# Patient Record
Sex: Male | Born: 1960 | Race: White | Hispanic: No | Marital: Married | Smoking: Never smoker
Health system: Southern US, Community
[De-identification: ages and names within clinical notes are randomized; demographics above are authoritative.]

## PROBLEM LIST (undated history)

## (undated) DIAGNOSIS — B019 Varicella without complication: Secondary | ICD-10-CM

## (undated) DIAGNOSIS — I712 Thoracic aortic aneurysm, without rupture, unspecified: Secondary | ICD-10-CM

## (undated) DIAGNOSIS — I10 Essential (primary) hypertension: Secondary | ICD-10-CM

## (undated) DIAGNOSIS — E785 Hyperlipidemia, unspecified: Secondary | ICD-10-CM

## (undated) DIAGNOSIS — L988 Other specified disorders of the skin and subcutaneous tissue: Secondary | ICD-10-CM

## (undated) DIAGNOSIS — I2510 Atherosclerotic heart disease of native coronary artery without angina pectoris: Secondary | ICD-10-CM

## (undated) DIAGNOSIS — M1990 Unspecified osteoarthritis, unspecified site: Secondary | ICD-10-CM

## (undated) DIAGNOSIS — K219 Gastro-esophageal reflux disease without esophagitis: Secondary | ICD-10-CM

## (undated) DIAGNOSIS — T7840XA Allergy, unspecified, initial encounter: Secondary | ICD-10-CM

## (undated) HISTORY — DX: Gastro-esophageal reflux disease without esophagitis: K21.9

## (undated) HISTORY — DX: Varicella without complication: B01.9

## (undated) HISTORY — DX: Atherosclerotic heart disease of native coronary artery without angina pectoris: I25.10

## (undated) HISTORY — DX: Thoracic aortic aneurysm, without rupture, unspecified: I71.20

## (undated) HISTORY — PX: CYSTOSCOPY: SUR368

## (undated) HISTORY — PX: COLONOSCOPY: SHX174

## (undated) HISTORY — DX: Essential (primary) hypertension: I10

## (undated) HISTORY — DX: Other specified disorders of the skin and subcutaneous tissue: L98.8

## (undated) HISTORY — PX: WISDOM TOOTH EXTRACTION: SHX21

## (undated) HISTORY — DX: Unspecified osteoarthritis, unspecified site: M19.90

## (undated) HISTORY — DX: Thoracic aortic aneurysm, without rupture: I71.2

## (undated) HISTORY — PX: DENTAL SURGERY: SHX609

## (undated) HISTORY — DX: Allergy, unspecified, initial encounter: T78.40XA

## (undated) HISTORY — PX: MASTECTOMY: SHX3

## (undated) HISTORY — PX: ESOPHAGOGASTRODUODENOSCOPY: SHX1529

## (undated) HISTORY — DX: Hyperlipidemia, unspecified: E78.5

---

## 1996-02-02 ENCOUNTER — Encounter: Payer: Self-pay | Admitting: Pulmonary Disease

## 2005-05-31 ENCOUNTER — Ambulatory Visit: Payer: Self-pay | Admitting: Pulmonary Disease

## 2005-11-29 HISTORY — PX: LEG SURGERY: SHX1003

## 2006-08-04 ENCOUNTER — Ambulatory Visit: Payer: Self-pay | Admitting: Pulmonary Disease

## 2007-10-03 ENCOUNTER — Ambulatory Visit: Payer: Self-pay | Admitting: Pulmonary Disease

## 2008-10-11 DIAGNOSIS — J4540 Moderate persistent asthma, uncomplicated: Secondary | ICD-10-CM | POA: Insufficient documentation

## 2008-10-11 DIAGNOSIS — J4530 Mild persistent asthma, uncomplicated: Secondary | ICD-10-CM | POA: Insufficient documentation

## 2008-10-14 ENCOUNTER — Ambulatory Visit: Payer: Self-pay | Admitting: Pulmonary Disease

## 2008-11-15 ENCOUNTER — Telehealth (INDEPENDENT_AMBULATORY_CARE_PROVIDER_SITE_OTHER): Payer: Self-pay | Admitting: *Deleted

## 2009-02-24 ENCOUNTER — Telehealth (INDEPENDENT_AMBULATORY_CARE_PROVIDER_SITE_OTHER): Payer: Self-pay | Admitting: *Deleted

## 2010-05-25 ENCOUNTER — Telehealth (INDEPENDENT_AMBULATORY_CARE_PROVIDER_SITE_OTHER): Payer: Self-pay | Admitting: *Deleted

## 2010-05-28 ENCOUNTER — Ambulatory Visit: Payer: Self-pay | Admitting: Pulmonary Disease

## 2010-12-30 NOTE — Medication Information (Signed)
Summary: [employer]  [employer]   Imported By: [place] 06/08/2010 09:50:29  _____________________________________________________________________  External Attachment:    Type:   Image     Comment:   External Document

## 2010-12-30 NOTE — Progress Notes (Signed)
Summary: symbicort sample  Phone Note Call from Patient Call back at [phone]   Caller: Patient Call For: clance Reason for Call: Talk to Nurse Summary of Call: pt will be out of Symbicort today...can he get a sample to last until his visit Thursday? Initial call taken by: Eugene Gavia,  May 25, 2010 9:58 AM  Follow-up for Phone Call        1 sample symbicort 160-4.79mcg left up front for pt to pick up at his convenience.  pt verbalized his understanding. Boone Master CNA/MA  May 25, 2010 10:35 AM

## 2010-12-30 NOTE — Assessment & Plan Note (Signed)
Summary: rov for asthma   CC:  Pt is here for an overdue f/u appt.  Pt was last seen Nov 2009.    Pt states breathing is "good."  Pt states he does use rescue hfa every morning whether he needs it or not.  Pt states he will occ cough up cream to yellow colored sputum. Marland Kitchen  History of Present Illness: the pt comes in today for f/u of his known asthma.  He has been doing very well on symbicort, and reports no acute exacerbations or increased rescue inhaler use.  He does use his albuterol each am when he first gets up emperically, but doesn't really need it.  I have asked him to stop.  He exercises regularly, and does not feel his airways disease interferes with this.  Current Medications (verified): 1)  Ventolin Hfa 108 (90 Base) Mcg/act  Aers (Albuterol Sulfate) .... 2 Puffs Every 4 Hours As Needed 2)  Symbicort 160-4.5 Mcg/act Aero (Budesonide-Formoterol Fumarate) .... 2 Puffs Two Times A Day; Rinse Mouth Well After Using 3)  Zyrtec Allergy 10 Mg  Tabs (Cetirizine Hcl) .... Take 1 Tablet By Mouth Once A Day  Allergies (verified): 1)  ! Penicillin  Review of Systems       The patient complains of productive cough.  The patient denies shortness of breath with activity, shortness of breath at rest, non-productive cough, coughing up blood, chest pain, irregular heartbeats, acid heartburn, indigestion, loss of appetite, weight change, abdominal pain, difficulty swallowing, sore throat, tooth/dental problems, headaches, nasal congestion/difficulty breathing through nose, sneezing, itching, ear ache, anxiety, depression, hand/feet swelling, joint stiffness or pain, rash, change in color of mucus, and fever.    Vital Signs:  Patient profile:   50 year old male Height:      72 inches Weight:      205 pounds BMI:     27.90 O2 Sat:      97 % on Room air Temp:     98.1 degrees F oral Pulse rate:   64 / minute BP sitting:   130 / 84  (right arm) Cuff size:   regular  Vitals Entered By: Arman Filter LPN (May 28, 2010 3:09 PM)  O2 Flow:  Room air CC: Pt is here for an overdue f/u appt.  Pt was last seen Nov 2009.    Pt states breathing is "good."  Pt states he does use rescue hfa every morning whether he needs it or not.  Pt states he will occ cough up cream to yellow colored sputum.  Comments Medications reviewed with patient Arman Filter LPN  May 28, 2010 3:09 PM    Physical Exam  General:  wd male in nad Lungs:  totally clear to auscultation no wheezing or rhonchi Heart:  rrr Extremities:  no edema or cyanosis Neurologic:  alert and oriented, moves all 4.   Impression & Recommendations:  Problem # 1:  ASTHMA (ICD-493.90) the pt is doing very well on symbicort, with no exacerbations or increased rescue inhaler use.  I have asked him to stop using albuterol emperically in the am, and let me know if he sees a difference.  He is very pleased with his current level of control, and exercises regularly.  I have asked him to f/u with me in one year, and will check spirometry next visit.  Medications Added to Medication List This Visit: 1)  Zyrtec Allergy 10 Mg Tabs (Cetirizine hcl) .... Take 1 tablet by mouth once a day  as needed  Other Orders: Est. Patient Level III (11914)  Patient Instructions: 1)  followup with me in 12mos.  will check breathing studies next visit. 2)  no change in meds except stop ventolin in am's.

## 2011-06-10 ENCOUNTER — Other Ambulatory Visit: Payer: Self-pay | Admitting: Pulmonary Disease

## 2011-08-13 ENCOUNTER — Other Ambulatory Visit: Payer: Self-pay | Admitting: Pulmonary Disease

## 2011-09-24 ENCOUNTER — Other Ambulatory Visit: Payer: Self-pay | Admitting: Pulmonary Disease

## 2011-09-24 NOTE — Telephone Encounter (Signed)
LMOMTCB x 1 for the patient so we can schedule follow-up.

## 2011-09-27 ENCOUNTER — Telehealth: Payer: Self-pay | Admitting: Pulmonary Disease

## 2011-09-27 NOTE — Telephone Encounter (Signed)
LMTCBx1 to advise the pt that we can only send in 1 inhaler to last until OV since it has been 3 years sicne pt has been seen, does he still wants this sent to [facility]? Carron Curie, CMA

## 2011-09-28 NOTE — Telephone Encounter (Signed)
I was incorrect, pt last seen 04-2010. I spoke with the pt and he states he has enough to last until  His appt with Dr. Shelle Iron so no refill needed. Carron Curie, CMA

## 2011-10-28 ENCOUNTER — Telehealth: Payer: Self-pay | Admitting: Pulmonary Disease

## 2011-10-28 MED ORDER — BUDESONIDE-FORMOTEROL FUMARATE 160-4.5 MCG/ACT IN AERO: 2.0000 | INHALATION_SPRAY | Freq: Two times a day (BID) | RESPIRATORY_TRACT | Status: DC

## 2011-10-28 NOTE — Telephone Encounter (Signed)
I spoke with pt and advised him we did not have any samples. Pt states then he would need an rx sent. Pt was scheduled to see [facility] 10/29/11 but since he was rescheduled I advised pt will send rx x1 but will have to show up for OV on 11/19/11 for any further refills. Pt verbalized understanding

## 2011-10-29 ENCOUNTER — Ambulatory Visit: Payer: Self-pay | Admitting: Pulmonary Disease

## 2011-11-18 ENCOUNTER — Encounter: Payer: Self-pay | Admitting: *Deleted

## 2011-11-18 ENCOUNTER — Encounter: Payer: Self-pay | Admitting: Pulmonary Disease

## 2011-11-19 ENCOUNTER — Encounter: Payer: Self-pay | Admitting: Pulmonary Disease

## 2011-11-19 ENCOUNTER — Ambulatory Visit (INDEPENDENT_AMBULATORY_CARE_PROVIDER_SITE_OTHER): Payer: Managed Care, Other (non HMO) | Admitting: Pulmonary Disease

## 2011-11-19 VITALS — BP 138/100 | HR 66 | Temp 97.6°F | Ht 72.0 in | Wt 213.8 lb

## 2011-11-19 DIAGNOSIS — J45909 Unspecified asthma, uncomplicated: Secondary | ICD-10-CM

## 2011-11-19 MED ORDER — BUDESONIDE-FORMOTEROL FUMARATE 160-4.5 MCG/ACT IN AERO: 2.0000 | INHALATION_SPRAY | Freq: Two times a day (BID) | RESPIRATORY_TRACT | Status: DC

## 2011-11-19 NOTE — Assessment & Plan Note (Signed)
The patient is doing very well on his current regimen.  I have asked him to continue his symbicort, and to stay as active as possible.  His spirometry today is totally normal.  He will followup with me in one year, or sooner if having issues.

## 2011-11-19 NOTE — Progress Notes (Deleted)
  Subjective:    Patient ID: Phillip [name], male    DOB: [birth year], 50 y.o.   MRN: [id number]  HPI    Review of Systems  Constitutional: Negative for fever and unexpected weight change.  HENT: Negative for ear pain, nosebleeds, congestion, sore throat, rhinorrhea, sneezing, trouble swallowing, dental problem, postnasal drip and sinus pressure.   Eyes: Negative for redness and itching.  Respiratory: Negative for cough, chest tightness, shortness of breath and wheezing.   Cardiovascular: Negative for palpitations and leg swelling.  Gastrointestinal: Negative for nausea and vomiting.  Genitourinary: Negative for dysuria.  Musculoskeletal: Negative for joint swelling.  Skin: Negative for rash.  Neurological: Negative for headaches.  Hematological: Does not bruise/bleed easily.  Psychiatric/Behavioral: Negative for dysphoric mood. The patient is not nervous/anxious.        Objective:   Physical Exam        Assessment & Plan:

## 2011-11-19 NOTE — Patient Instructions (Signed)
No change in medications followup with me in one year.  

## 2011-11-19 NOTE — Progress Notes (Signed)
  Subjective:    Patient ID: Phillip [name], male    DOB: 1961/03/22, 50 y.o.   MRN: [id number]  HPI Patient comes in today for followup of his known asthma.  He's been doing very well on symbicort, and almost never uses his rescue inhaler.  He has no nocturnal symptoms, and has not had an acute exacerbation since his last visit.  He is staying very active, and denies any issues with dyspnea   Review of Systems  Constitutional: Negative for fever and unexpected weight change.  HENT: Negative for ear pain, nosebleeds, congestion, sore throat, rhinorrhea, sneezing, trouble swallowing, dental problem, postnasal drip and sinus pressure.   Eyes: Negative for redness and itching.  Respiratory: Negative for cough, chest tightness, shortness of breath and wheezing.   Cardiovascular: Negative for palpitations and leg swelling.  Gastrointestinal: Negative for nausea and vomiting.  Genitourinary: Negative for dysuria.  Musculoskeletal: Negative for joint swelling.  Skin: Negative for rash.  Neurological: Negative for headaches.  Hematological: Does not bruise/bleed easily.  Psychiatric/Behavioral: Negative for dysphoric mood. The patient is not nervous/anxious.        Objective:   Physical Exam Well-developed male in no acute distress Nose without purulence or discharge noted Chest totally clear to auscultation, no wheezing Cardiac exam with regular rate and rhythm Lower extremities without edema, no cyanosis noted Alert and oriented, moves all 4 extremities.       Assessment & Plan:

## 2011-11-19 NOTE — Progress Notes (Signed)
Addended by: Tommie Sams on: 11/19/2011 10:12 AM   Modules accepted: Orders

## 2012-06-16 ENCOUNTER — Telehealth: Payer: Self-pay | Admitting: Pulmonary Disease

## 2012-06-16 MED ORDER — BUDESONIDE-FORMOTEROL FUMARATE 160-4.5 MCG/ACT IN AERO: 2.0000 | INHALATION_SPRAY | Freq: Two times a day (BID) | RESPIRATORY_TRACT | Status: DC

## 2012-06-16 NOTE — Telephone Encounter (Signed)
Pt aware Rx has been sent and pharmacy placed on file in his chart.

## 2012-11-20 ENCOUNTER — Ambulatory Visit (INDEPENDENT_AMBULATORY_CARE_PROVIDER_SITE_OTHER): Payer: 59 | Admitting: Pulmonary Disease

## 2012-11-20 ENCOUNTER — Encounter: Payer: Self-pay | Admitting: Pulmonary Disease

## 2012-11-20 VITALS — BP 128/88 | HR 60 | Temp 97.9°F | Ht 72.0 in | Wt 194.0 lb

## 2012-11-20 DIAGNOSIS — J45909 Unspecified asthma, uncomplicated: Secondary | ICD-10-CM

## 2012-11-20 NOTE — Patient Instructions (Addendum)
No change in medications followup with me in one year.

## 2012-11-20 NOTE — Assessment & Plan Note (Signed)
The patient is doing very well from an asthma standpoint.  He is staying on his maintenance medications, and rarely uses his rescue inhaler.  I have asked him to continue on his bronchodilator regimen, and to followup with me in one year.

## 2012-11-20 NOTE — Progress Notes (Signed)
  Subjective:    Patient ID: Phillip [name], male    DOB: September 05, 1961, 51 y.o.   MRN: [id number]  HPI She comes in today for followup of his known asthma.  He is taking his maintenance medication compliantly, and rarely has to use his rescue inhaler.  He has had no acute exacerbation her pulmonary infection since last visit.   Review of Systems  Constitutional: Negative for fever and unexpected weight change.  HENT: Negative for ear pain, nosebleeds, congestion, sore throat, rhinorrhea, sneezing, trouble swallowing, dental problem, postnasal drip and sinus pressure.   Eyes: Negative for redness and itching.  Respiratory: Negative for cough, chest tightness, shortness of breath and wheezing.   Cardiovascular: Negative for palpitations and leg swelling.  Gastrointestinal: Negative for nausea and vomiting.  Genitourinary: Negative for dysuria.  Musculoskeletal: Negative for joint swelling.  Skin: Negative for rash.  Neurological: Negative for headaches.  Hematological: Does not bruise/bleed easily.  Psychiatric/Behavioral: Negative for dysphoric mood. The patient is not nervous/anxious.        Objective:   Physical Exam Well-developed male in no acute distress Nose without purulence or discharge noted Chest totally clear to auscultation, no wheezing Cardiac exam with regular rate and rhythm Lower extremities without edema, cyanosis Alert and oriented, moves all 4 extremities.       Assessment & Plan:

## 2013-02-01 ENCOUNTER — Telehealth: Payer: Self-pay | Admitting: Pulmonary Disease

## 2013-02-01 ENCOUNTER — Other Ambulatory Visit: Payer: Self-pay | Admitting: Pulmonary Disease

## 2013-02-01 NOTE — Telephone Encounter (Signed)
Refill was sent today. I called pharmacy to verify they received it and they did. Pt is aware. Carron Curie, CMA

## 2013-03-06 ENCOUNTER — Other Ambulatory Visit: Payer: Self-pay | Admitting: Pulmonary Disease

## 2013-11-19 ENCOUNTER — Encounter: Payer: Self-pay | Admitting: Pulmonary Disease

## 2013-11-19 ENCOUNTER — Ambulatory Visit (INDEPENDENT_AMBULATORY_CARE_PROVIDER_SITE_OTHER): Payer: 59 | Admitting: Pulmonary Disease

## 2013-11-19 VITALS — BP 132/94 | HR 81 | Temp 98.1°F | Ht 72.0 in | Wt 206.8 lb

## 2013-11-19 DIAGNOSIS — J45909 Unspecified asthma, uncomplicated: Secondary | ICD-10-CM

## 2013-11-19 NOTE — Progress Notes (Signed)
   Subjective:    Patient ID: Phillip [name], male    DOB: 07-23-1961, 52 y.o.   MRN: [id number]  HPI The patient comes in today for followup of his known asthma. He is doing very well, and has not had an acute exacerbation since last visit. He never uses his rescue inhaler, and has excellent exertional tolerance.   Review of Systems  Constitutional: Negative for fever and unexpected weight change.  HENT: Positive for congestion. Negative for dental problem, ear pain, nosebleeds, postnasal drip, rhinorrhea, sinus pressure, sneezing, sore throat and trouble swallowing.   Eyes: Negative for redness and itching.  Respiratory: Positive for cough. Negative for chest tightness, shortness of breath and wheezing.   Cardiovascular: Negative for palpitations and leg swelling.  Gastrointestinal: Negative for nausea and vomiting.  Genitourinary: Negative for dysuria.  Musculoskeletal: Negative for joint swelling.  Skin: Negative for rash.  Neurological: Negative for headaches.  Hematological: Does not bruise/bleed easily.  Psychiatric/Behavioral: Negative for dysphoric mood. The patient is not nervous/anxious.        Objective:   Physical Exam Well-developed male in no acute distress Nose without purulence or discharge noted Neck without lymphadenopathy or thyromegaly Chest totally clear to auscultation, no wheezing Cardiac exam with regular rate and rhythm Lower extremities without edema, no cyanosis Alert and oriented, moves all 4 extremities.       Assessment & Plan:

## 2013-11-19 NOTE — Patient Instructions (Signed)
No change in medications followup with me in one year if doing well.

## 2013-11-19 NOTE — Assessment & Plan Note (Signed)
The patient is doing very well from an asthma standpoint on his current regimen, and has not required his rescue inhaler. I have asked him to stay on his maintenance medications, and to followup with me yearly. He has already had his flu shot this year.

## 2013-11-30 ENCOUNTER — Other Ambulatory Visit: Payer: Self-pay | Admitting: Pulmonary Disease

## 2014-06-21 ENCOUNTER — Telehealth: Payer: Self-pay | Admitting: Pulmonary Disease

## 2014-06-21 ENCOUNTER — Other Ambulatory Visit: Payer: Self-pay | Admitting: Pulmonary Disease

## 2014-06-21 MED ORDER — SYMBICORT 160-4.5 MCG/ACT IN AERO: INHALATION_SPRAY | RESPIRATORY_TRACT | Status: DC

## 2014-06-21 NOTE — Telephone Encounter (Signed)
Pt aware RX has been sent. Nothing further needed 

## 2014-09-27 ENCOUNTER — Telehealth: Payer: Self-pay | Admitting: Pulmonary Disease

## 2014-09-27 NOTE — Telephone Encounter (Signed)
lmomtcb x1 What is on his formulary?

## 2014-09-27 NOTE — Telephone Encounter (Signed)
Pt called back. Reports alvesco, qvar and asmanex is on formulary. Please advise [place] thanks

## 2014-09-30 ENCOUNTER — Telehealth: Payer: Self-pay | Admitting: Pulmonary Disease

## 2014-09-30 NOTE — Telephone Encounter (Signed)
Let him know that none of those are the EQUIVALENT of symbicort. His insurance has to have something on their formulary that is the same.  Have him check for advair, breo, dulera.

## 2014-09-30 NOTE — Telephone Encounter (Signed)
Message closed in error copied below:  Phillip [name] at 09/30/2014 10:24 AM     Status: Signed       Expand All Collapse All   Pt returned call. [place] at 09/30/2014 9:18 AM     Status: Signed       Expand All Collapse All   lmtcb X1            Kathee Delton, MD at 09/30/2014 9:07 AM     Status: Signed       Expand All Collapse All   Let him know that none of those are the EQUIVALENT of symbicort. His insurance has to have something on their formulary that is the same. Have him check for advair, breo, dulera.             Inge Rise, CMA at 09/27/2014 2:21 PM     Status: Signed       Expand All Collapse All   Pt called back. Reports alvesco, qvar and asmanex is on formulary. Please advise [place] thanks            Inge Rise, [place] at 09/27/2014 2:15 PM     Status: Signed       Expand All Collapse All   lmomtcb x1 What is on his formulary?             Encounter MyChart Messages     No messages in this encounter     Routing History     Priority Sent On From To Message Type     09/30/2014 9:08 AM Kathee Delton, MD [facility] Triage Pool Patient Calls     09/27/2014 2:21 PM Mindy Elza Rafter, CMA Kathee Delton, MD Patient Calls     09/27/2014 1:45 PM Ilona Sorrel [facility] Triage Pool Patient Calls      Created by     Ilona Sorrel on 09/27/2014 01:43 PM     [place], [place] - [place]     Contacts       Type Contact Phone    09/27/2014 01:43 PM Phone (Incoming) Sadiq, [place] (Self) [phone] (M)    Changed insurances. Symbicort is not on the formulary. They told him it could be appealed or [employer] could give something else that is on formulary.

## 2014-09-30 NOTE — Telephone Encounter (Signed)
Called and spoke with pt and he stated that he has tried the advair in the past and this was just not effective for him, so he was changed to symbicort.  He will call the insurance company and see if the [place] or breo is covered.  If not pt would like to see if we can do the PA for the symbicort.  Will send to [place] to wait for pt to call back.

## 2014-09-30 NOTE — Telephone Encounter (Signed)
Pt returned call.  Phillip [name] ° °

## 2014-09-30 NOTE — Telephone Encounter (Signed)
lmtcb X1 

## 2014-10-02 NOTE — Telephone Encounter (Signed)
Pt has not called back LMTCB x1

## 2014-10-04 NOTE — Telephone Encounter (Signed)
Called and spoke with patient, states that he has not had a chance to call the insurance company yet. Will call us once he speaks with them-plans to call next week. Pt states that his pharmacy gave him a free Rx- so he is good for another 30 days. Will send to [place] to await patient call back.

## 2014-10-04 NOTE — Telephone Encounter (Signed)
Once pt speaks with his insurance and calls back, message can be sent to [facility] to advise on thanks

## 2014-10-09 NOTE — Telephone Encounter (Signed)
Called and spoke to pt. Pt stated he has sent a letter to his insurance company regarding his formulary and is waiting to hear back. Pt stated if he has not received call from insurance company in 1-2 days then he will call then and then call us back with update.   Will forward to [place] to make aware.

## 2014-10-09 NOTE — Telephone Encounter (Signed)
Once pt calls back. Message should be routed to [facility]. Thanks

## 2014-10-15 NOTE — Telephone Encounter (Signed)
Called spoke with patient to obtain update Pt stated that he has sent a letter to his insurance company asking to add Symbicort to his formulary and reported has not yet heard anything back from them - he will call them to check on this when he can.  Pt is also aware that he is due for appt with [facility] but was unable to schedule this now.

## 2014-10-16 NOTE — Telephone Encounter (Signed)
LMTCB

## 2014-10-16 NOTE — Telephone Encounter (Signed)
Spoke with pt-- Symbicort is going to be a tier three medication ($70) -- recommended meds per last telephone note advair, breo, dulera are also tier three.  Please advise Dr Gwenette Greet if any other alternatives.

## 2014-10-16 NOTE — Telephone Encounter (Signed)
Those are the 4 meds that are equivalent.  No other choices Is it still that price if he does mail order for 70mos or more.

## 2014-10-16 NOTE — Telephone Encounter (Signed)
Pt returning call.Phillip [name] ° °

## 2014-10-17 NOTE — Telephone Encounter (Signed)
lmomtcb x 2  

## 2014-10-21 NOTE — Telephone Encounter (Signed)
lmomtcb x 3  

## 2014-10-22 MED ORDER — FLUTICASONE-SALMETEROL 250-50 MCG/DOSE IN AEPB: 1.0000 | INHALATION_SPRAY | Freq: Two times a day (BID) | RESPIRATORY_TRACT | Status: DC

## 2014-10-22 NOTE — Telephone Encounter (Signed)
advair 250/50  One inhalation bid Rinse mouth well.

## 2014-10-22 NOTE — Telephone Encounter (Signed)
Rx sent. Pt aware.  

## 2014-10-22 NOTE — Telephone Encounter (Signed)
Pt states that he would liked to go back on the Advair for now to see if it will help and if it doesn't he said he will make appt to see what he needs to do about other alternatives.  Please advise on dose and strength of Advair.

## 2015-02-24 ENCOUNTER — Telehealth: Payer: Self-pay | Admitting: Pulmonary Disease

## 2015-02-24 ENCOUNTER — Other Ambulatory Visit: Payer: Self-pay | Admitting: Pulmonary Disease

## 2015-02-24 NOTE — Telephone Encounter (Signed)
Samples have been left up front for pick up. Pt is aware. Nothing further was needed. 

## 2015-03-17 ENCOUNTER — Ambulatory Visit (INDEPENDENT_AMBULATORY_CARE_PROVIDER_SITE_OTHER): Payer: 59 | Admitting: Pulmonary Disease

## 2015-03-17 ENCOUNTER — Encounter: Payer: Self-pay | Admitting: Pulmonary Disease

## 2015-03-17 VITALS — BP 150/84 | HR 81 | Temp 97.2°F | Ht 72.0 in | Wt 213.4 lb

## 2015-03-17 DIAGNOSIS — J301 Allergic rhinitis due to pollen: Secondary | ICD-10-CM

## 2015-03-17 DIAGNOSIS — J309 Allergic rhinitis, unspecified: Secondary | ICD-10-CM | POA: Insufficient documentation

## 2015-03-17 MED ORDER — FLUTICASONE-SALMETEROL 115-21 MCG/ACT IN AERO: 2.0000 | INHALATION_SPRAY | Freq: Two times a day (BID) | RESPIRATORY_TRACT | Status: DC

## 2015-03-17 MED ORDER — PREDNISONE 10 MG PO TABS: ORAL_TABLET | ORAL | Status: DC

## 2015-03-17 MED ORDER — MONTELUKAST SODIUM 10 MG PO TABS: 10.0000 mg | ORAL_TABLET | Freq: Every day | ORAL | Status: DC

## 2015-03-17 MED ORDER — ALBUTEROL SULFATE HFA 108 (90 BASE) MCG/ACT IN AERS: 2.0000 | INHALATION_SPRAY | Freq: Four times a day (QID) | RESPIRATORY_TRACT | Status: DC | PRN

## 2015-03-17 NOTE — Assessment & Plan Note (Signed)
The patient has done fairly well from an asthma standpoint, but has been having some issues with the spring allergy season. He is currently having some increased symptoms, and does have some mild wheezing on exam today. He has done well with Symbicort in the past, but his insurance no longer covers this. He is been changed to [place], but wishes to try HFA.

## 2015-03-17 NOTE — Patient Instructions (Signed)
Change your advair diskus to advair HFA 115, 2 inhalation each am and pm.  Rinse mouth well Prednisone taper over 8 days to get you thru your allergy issues Start on singulair 10mg  each am.  Take from March thru June each year if having allergy issues.  Can also take Sept thru Nov if having fall allergy issues.  followup with Dr. [place] Bells in one year if doing well.

## 2015-03-17 NOTE — Assessment & Plan Note (Signed)
The patient is having a severe acute exacerbation of his allergic rhinitis, and I will start him on a short course of prednisone to get him through this episode. Would also like to try him on Singulair March through June to see if this will prevent his flareups.

## 2015-03-17 NOTE — Progress Notes (Signed)
   Subjective:    Patient ID: Phillip [name], male    DOB: 02-21-1961, 54 y.o.   MRN: [id number]  HPI Patient comes in today for follow-up of his known asthma. He has done fairly well from a breathing standpoint, but has been having severe issues the spring with allergies. He has both I and nasal symptoms, and feels that it does bother his asthma. His insurance recently quit covering Symbicort and he has been on Advair discus. He likes HFA better, and will change him over to the Advair [facility] formulation. He has not been overusing his rescue inhaler.   Review of Systems  Constitutional: Negative for fever and unexpected weight change.  HENT: Positive for congestion, postnasal drip and rhinorrhea. Negative for dental problem, ear pain, nosebleeds, sinus pressure, sneezing, sore throat and trouble swallowing.   Eyes: Negative for redness and itching.  Respiratory: Positive for cough and shortness of breath. Negative for chest tightness and wheezing.   Cardiovascular: Negative for palpitations and leg swelling.  Gastrointestinal: Negative for nausea and vomiting.  Genitourinary: Negative for dysuria.  Musculoskeletal: Negative for joint swelling.  Skin: Negative for rash.  Neurological: Negative for headaches.  Hematological: Does not bruise/bleed easily.  Psychiatric/Behavioral: Negative for dysphoric mood. The patient is not nervous/anxious.        Objective:   Physical Exam Well-developed male in no acute distress Nose without purulence or discharge noted Neck without lymphadenopathy or thyromegaly Chest with scattered wheezes, but adequate airflow Cardiac exam with regular rate and rhythm Lower extremities without edema, no cyanosis Alert and oriented, moves all 4 extremities.       Assessment & Plan:

## 2015-10-03 ENCOUNTER — Telehealth: Payer: Self-pay | Admitting: Behavioral Health

## 2015-10-03 ENCOUNTER — Encounter: Payer: Self-pay | Admitting: Behavioral Health

## 2015-10-03 NOTE — Telephone Encounter (Signed)
Pre-Visit Call completed with patient and chart updated.   Pre-Visit Info documented in Specialty Comments under [employer].    

## 2015-10-06 ENCOUNTER — Telehealth: Payer: Self-pay | Admitting: Physician Assistant

## 2015-10-06 ENCOUNTER — Ambulatory Visit: Payer: 59 | Admitting: Physician Assistant

## 2015-10-09 ENCOUNTER — Telehealth: Payer: Self-pay | Admitting: Physician Assistant

## 2015-10-09 NOTE — Telephone Encounter (Signed)
Talked to pt. He will call back when he is home and has calendar to reschedule new pt appt. Was not seen 10/06/15 due to Einar Pheasant being out sick.

## 2015-10-09 NOTE — Telephone Encounter (Signed)
error 

## 2015-11-17 ENCOUNTER — Telehealth: Payer: Self-pay | Admitting: Pulmonary Disease

## 2015-11-17 MED ORDER — FLUTICASONE-SALMETEROL 115-21 MCG/ACT IN AERO: 2.0000 | INHALATION_SPRAY | Freq: Two times a day (BID) | RESPIRATORY_TRACT | Status: DC

## 2015-11-17 NOTE — Telephone Encounter (Signed)
Left message on pt's named voicemail. Rx has been sent in to last until he comes in to see BQ (recall placed for April). Nothing further was needed.

## 2015-12-12 ENCOUNTER — Ambulatory Visit (INDEPENDENT_AMBULATORY_CARE_PROVIDER_SITE_OTHER): Payer: 59 | Admitting: Physician Assistant

## 2015-12-12 ENCOUNTER — Encounter: Payer: Self-pay | Admitting: Physician Assistant

## 2015-12-12 VITALS — BP 118/88 | HR 75 | Temp 98.0°F | Ht 72.0 in | Wt 216.0 lb

## 2015-12-12 DIAGNOSIS — Z1211 Encounter for screening for malignant neoplasm of colon: Secondary | ICD-10-CM

## 2015-12-12 DIAGNOSIS — M7712 Lateral epicondylitis, left elbow: Secondary | ICD-10-CM | POA: Diagnosis not present

## 2015-12-12 DIAGNOSIS — M7710 Lateral epicondylitis, unspecified elbow: Secondary | ICD-10-CM | POA: Insufficient documentation

## 2015-12-12 DIAGNOSIS — Z0001 Encounter for general adult medical examination with abnormal findings: Secondary | ICD-10-CM

## 2015-12-12 DIAGNOSIS — Z125 Encounter for screening for malignant neoplasm of prostate: Secondary | ICD-10-CM | POA: Insufficient documentation

## 2015-12-12 DIAGNOSIS — Z0000 Encounter for general adult medical examination without abnormal findings: Secondary | ICD-10-CM | POA: Diagnosis not present

## 2015-12-12 DIAGNOSIS — J4540 Moderate persistent asthma, uncomplicated: Secondary | ICD-10-CM

## 2015-12-12 DIAGNOSIS — Z114 Encounter for screening for human immunodeficiency virus [HIV]: Secondary | ICD-10-CM | POA: Diagnosis not present

## 2015-12-12 LAB — COMPREHENSIVE METABOLIC PANEL
ALK PHOS: 48 U/L (ref 39–117)
ALT: 40 U/L (ref 0–53)
AST: 23 U/L (ref 0–37)
Albumin: 4.6 g/dL (ref 3.5–5.2)
BUN: 12 mg/dL (ref 6–23)
CALCIUM: 9.7 mg/dL (ref 8.4–10.5)
CO2: 31 mEq/L (ref 19–32)
Chloride: 102 mEq/L (ref 96–112)
Creatinine, Ser: 1.02 mg/dL (ref 0.40–1.50)
GFR: 81.02 mL/min (ref >60.00)
Glucose, Bld: 117 mg/dL — ABNORMAL HIGH (ref 70–99)
Potassium: 4.1 mEq/L (ref 3.5–5.1)
Sodium: 141 mEq/L (ref 135–145)
TOTAL PROTEIN: 7.2 g/dL (ref 6.0–8.3)
Total Bilirubin: 0.6 mg/dL (ref 0.2–1.2)

## 2015-12-12 LAB — URINALYSIS, ROUTINE W REFLEX MICROSCOPIC
Bilirubin Urine: NEGATIVE
Hgb urine dipstick: NEGATIVE
Ketones, ur: NEGATIVE
Leukocytes, UA: NEGATIVE
Nitrite: NEGATIVE
PH: 6.5 (ref 5.0–8.0)
RBC / HPF: NONE SEEN (ref 0–?)
Specific Gravity, Urine: 1.015 (ref 1.000–1.030)
TOTAL PROTEIN, URINE-UPE24: NEGATIVE
URINE GLUCOSE: NEGATIVE
Urobilinogen, UA: 0.2 (ref 0.0–1.0)

## 2015-12-12 LAB — LIPID PANEL
CHOL/HDL RATIO: 5
Cholesterol: 214 mg/dL — ABNORMAL HIGH (ref 0–200)
HDL: 40.5 mg/dL (ref >39.00)
LDL CALC: 138 mg/dL — AB (ref 0–99)
NONHDL: 173.04
Triglycerides: 173 mg/dL — ABNORMAL HIGH (ref 0.0–149.0)
VLDL: 34.6 mg/dL (ref 0.0–40.0)

## 2015-12-12 LAB — CBC
HEMATOCRIT: 49.8 % (ref 39.0–52.0)
HEMOGLOBIN: 16.9 g/dL (ref 13.0–17.0)
MCHC: 33.9 g/dL (ref 30.0–36.0)
MCV: 91.1 fl (ref 78.0–100.0)
PLATELETS: 286 10*3/uL (ref 150.0–400.0)
RBC: 5.47 Mil/uL (ref 4.22–5.81)
RDW: 12.8 % (ref 11.5–15.5)
WBC: 6.7 10*3/uL (ref 4.0–10.5)

## 2015-12-12 LAB — PSA: PSA: 1.23 ng/mL (ref 0.10–4.00)

## 2015-12-12 LAB — HEMOGLOBIN A1C: HEMOGLOBIN A1C: 5.5 % (ref 4.6–6.5)

## 2015-12-12 LAB — TSH: TSH: 1.2 u[IU]/mL (ref 0.35–4.50)

## 2015-12-12 MED ORDER — MELOXICAM 15 MG PO TABS: 15.0000 mg | ORAL_TABLET | Freq: Every day | ORAL | Status: DC

## 2015-12-12 NOTE — Assessment & Plan Note (Signed)
Depression screen negative. Health Maintenance reviewed -- flu and tetanus up-to-date. Agrees to one-time HIV screen. Declines hep C screen. Will place referral for screening colonoscopy. Preventive schedule discussed and handout given in AVS. Will obtain fasting labs today.

## 2015-12-12 NOTE — Progress Notes (Signed)
Patient presents to clinic today to establish care. Patient is requesting CPE today. Is fasting for labs.   Patient endorses well-balanced diet. Is not exercising currently but is wanting to get back into a regimen. Body mass index is 29.29 kg/(m^2).  Acute Concerns: Patient endorses pain of lateral left elbow with repetitive motions. Works with his hands. Endorses history  Chronic Issues: Asthma -- Is followed by Pulmonology (Dr. Lake Bells). Is on Advair daily and albuterol PRN. Denies exacerbations or nighttime symptoms.   Health Maintenance: Immunizations -- up-to-date  Colonoscopy -- Is overdue for screening. Will place referral to GI for screening colonoscopy.  Agrees to one-time screen for HIV. Is only sexually active with his wife. Denies history of STI or concerns.  Past Medical History  Diagnosis Date  . Asthma     Followed by Pulmonology  . Chicken pox     Past Surgical History  Procedure Laterality Date  . Leg surgery Left 2007  . Mastectomy    . Wisdom tooth extraction    . Dental surgery      Current Outpatient Prescriptions on File Prior to Visit  Medication Sig Dispense Refill  . albuterol (VENTOLIN HFA) 108 (90 BASE) MCG/ACT inhaler Inhale 2 puffs into the lungs every 6 (six) hours as needed. 1 Inhaler 4  . cetirizine (ZYRTEC) 10 MG tablet Take 10 mg by mouth 2 (two) times daily.     . diphenhydrAMINE (BENADRYL) 25 MG tablet Take 25 mg by mouth at bedtime as needed.    . fluticasone-salmeterol (ADVAIR HFA) 115-21 MCG/ACT inhaler Inhale 2 puffs into the lungs 2 (two) times daily. 1 Inhaler 3  . ibuprofen (ADVIL,MOTRIN) 200 MG tablet Take 200 mg by mouth every 6 (six) hours as needed.     No current facility-administered medications on file prior to visit.    Allergies  Allergen Reactions  . Penicillins Rash    Childhood reaction    Family History  Problem Relation Age of Onset  . Hodgkin's lymphoma Child     Remission  . Hypertension Mother    Controlled with diet  . Hyperlipidemia Mother   . Emphysema Maternal Uncle     Social History   Social History  . Marital Status: Married    Spouse Name: N/A  . Number of Children: N/A  . Years of Education: N/A   Occupational History  . Not on file.   Social History Main Topics  . Smoking status: Never Smoker   . Smokeless tobacco: Never Used  . Alcohol Use: 0.0 oz/week    0 Standard drinks or equivalent per week  . Drug Use: No  . Sexual Activity: Not on file   Other Topics Concern  . Not on file   Social History Narrative   Review of Systems  Constitutional: Negative for fever and weight loss.  HENT: Negative for ear discharge, ear pain, hearing loss and tinnitus.   Eyes: Negative for blurred vision, double vision, photophobia and pain.  Respiratory: Negative for cough and shortness of breath.   Cardiovascular: Negative for chest pain and palpitations.  Gastrointestinal: Negative for heartburn, nausea, vomiting, abdominal pain, diarrhea, constipation, blood in stool and melena.  Genitourinary: Negative for dysuria, urgency, frequency, hematuria and flank pain.  Musculoskeletal: Positive for joint pain. Negative for falls.  Neurological: Negative for dizziness, loss of consciousness and headaches.  Endo/Heme/Allergies: Negative for environmental allergies.  Psychiatric/Behavioral: Negative for depression, suicidal ideas, hallucinations and substance abuse. The patient is not nervous/anxious and does not  have insomnia.    BP 118/88 mmHg  Pulse 75  Temp(Src) 98 F (36.7 C) (Oral)  Ht 6' (1.829 m)  Wt 216 lb (97.977 kg)  BMI 29.29 kg/m2  SpO2 99%  Physical Exam  Constitutional: He is oriented to person, place, and time and well-developed, well-nourished, and in no distress.  HENT:  Head: Normocephalic and atraumatic.  Right Ear: External ear normal.  Left Ear: External ear normal.  Nose: Nose normal.  Mouth/Throat: Oropharynx is clear and moist. No  oropharyngeal exudate.  Eyes: Conjunctivae and EOM are normal. Pupils are equal, round, and reactive to light.  Neck: Neck supple. No thyromegaly present.  Cardiovascular: Normal rate, regular rhythm, normal heart sounds and intact distal pulses.   Pulmonary/Chest: Effort normal and breath sounds normal. No respiratory distress. He has no wheezes. He has no rales. He exhibits no tenderness.  Abdominal: Soft. Bowel sounds are normal. He exhibits no distension and no mass. There is no tenderness. There is no rebound and no guarding.  Genitourinary: Testes/scrotum normal and penis normal. No discharge found.  Musculoskeletal:       Left elbow: He exhibits normal range of motion, no swelling and no effusion. Tenderness found. Lateral epicondyle tenderness noted.  Lymphadenopathy:    He has no cervical adenopathy.  Neurological: He is alert and oriented to person, place, and time.  Skin: Skin is warm and dry. No rash noted.  Psychiatric: Affect normal.  Vitals reviewed.   Recent Results (from the past 2160 hour(s))  CBC     Status: None   Collection Time: 12/12/15  8:37 AM  Result Value Ref Range   WBC 6.7 4.0 - 10.5 K/uL   RBC 5.47 4.22 - 5.81 Mil/uL   Platelets 286.0 150.0 - 400.0 K/uL   Hemoglobin 16.9 13.0 - 17.0 g/dL   HCT 49.8 39.0 - 52.0 %   MCV 91.1 78.0 - 100.0 fl   MCHC 33.9 30.0 - 36.0 g/dL   RDW 12.8 11.5 - 15.5 %  Comp Met (CMET)     Status: Abnormal   Collection Time: 12/12/15  8:37 AM  Result Value Ref Range   Sodium 141 135 - 145 mEq/L   Potassium 4.1 3.5 - 5.1 mEq/L   Chloride 102 96 - 112 mEq/L   CO2 31 19 - 32 mEq/L   Glucose, Bld 117 (H) 70 - 99 mg/dL   BUN 12 6 - 23 mg/dL   Creatinine, Ser 1.02 0.40 - 1.50 mg/dL   Total Bilirubin 0.6 0.2 - 1.2 mg/dL   Alkaline Phosphatase 48 39 - 117 U/L   AST 23 0 - 37 U/L   ALT 40 0 - 53 U/L   Total Protein 7.2 6.0 - 8.3 g/dL   Albumin 4.6 3.5 - 5.2 g/dL   Calcium 9.7 8.4 - 10.5 mg/dL   GFR 81.02 >60.00 mL/min  TSH      Status: None   Collection Time: 12/12/15  8:37 AM  Result Value Ref Range   TSH 1.20 0.35 - 4.50 uIU/mL  Hemoglobin A1c     Status: None   Collection Time: 12/12/15  8:37 AM  Result Value Ref Range   Hgb A1c MFr Bld 5.5 4.6 - 6.5 %    Comment: Glycemic Control Guidelines for People with Diabetes:Non Diabetic:  <6%Goal of Therapy: <7%Additional Action Suggested:  >8%   Urinalysis, Routine w reflex microscopic     Status: Abnormal   Collection Time: 12/12/15  8:37 AM  Result Value Ref  Range   Color, Urine YELLOW Yellow;Lt. Yellow   APPearance CLEAR Clear   Specific Gravity, Urine 1.015 1.000-1.030   pH 6.5 5.0 - 8.0   Total Protein, Urine NEGATIVE Negative   Urine Glucose NEGATIVE Negative   Ketones, ur NEGATIVE Negative   Bilirubin Urine NEGATIVE Negative   Hgb urine dipstick NEGATIVE Negative   Urobilinogen, UA 0.2 0.0 - 1.0   Leukocytes, UA NEGATIVE Negative   Nitrite NEGATIVE Negative   WBC, UA 0-2/hpf 0-2/hpf   RBC / HPF none seen 0-2/hpf   Mucus, UA Presence of (A) None  Lipid Profile     Status: Abnormal   Collection Time: 12/12/15  8:37 AM  Result Value Ref Range   Cholesterol 214 (H) 0 - 200 mg/dL    Comment: ATP III Classification       Desirable:  < 200 mg/dL               Borderline High:  200 - 239 mg/dL          High:  > = 240 mg/dL   Triglycerides 173.0 (H) 0.0 - 149.0 mg/dL    Comment: Normal:  <150 mg/dLBorderline High:  150 - 199 mg/dL   HDL 40.50 >39.00 mg/dL   VLDL 34.6 0.0 - 40.0 mg/dL   LDL Cholesterol 138 (H) 0 - 99 mg/dL   Total CHOL/HDL Ratio 5     Comment:                Men          Women1/2 Average Risk     3.4          3.3Average Risk          5.0          4.42X Average Risk          9.6          7.13X Average Risk          15.0          11.0                       NonHDL 173.04     Comment: NOTE:  Non-HDL goal should be 30 mg/dL higher than patient's LDL goal (i.e. LDL goal of < 70 mg/dL, would have non-HDL goal of < 100 mg/dL)  PSA     Status: None    Collection Time: 12/12/15  8:37 AM  Result Value Ref Range   PSA 1.23 0.10 - 4.00 ng/mL    Assessment/Plan: Visit for preventive health examination Depression screen negative. Health Maintenance reviewed -- flu and tetanus up-to-date. Agrees to one-time HIV screen. Declines hep C screen. Will place referral for screening colonoscopy. Preventive schedule discussed and handout given in AVS. Will obtain fasting labs today.   Tennis elbow syndrome Rx Anaprox DS. Supportive measures reviewed. Follow-up PRN.  Screening for HIV without presence of risk factors Order for HIV antibody placed.  Prostate cancer screening Discussed risks and benefits of prostate cancer screening. Asymptomatic. Discussed DRE versus PSA versus both. Patient elects to proceed with PSA. Declines DRE.  Moderate persistent asthma in adult without complication Stable. Followed by Pulmonology. Continue medications as directed

## 2015-12-12 NOTE — Assessment & Plan Note (Signed)
Order for HIV antibody placed.

## 2015-12-12 NOTE — Assessment & Plan Note (Signed)
Discussed risks and benefits of prostate cancer screening. Asymptomatic. Discussed DRE versus PSA versus both. Patient elects to proceed with PSA. Declines DRE.

## 2015-12-12 NOTE — Assessment & Plan Note (Signed)
Rx Anaprox DS. Supportive measures reviewed. Follow-up PRN.

## 2015-12-12 NOTE — Assessment & Plan Note (Signed)
Stable. Followed by Pulmonology. Continue medications as directed

## 2015-12-12 NOTE — Progress Notes (Signed)
Pre visit review using our clinic review tool, if applicable. No additional management support is needed unless otherwise documented below in the visit note. 

## 2015-12-12 NOTE — Patient Instructions (Signed)
Please go to the lab for blood work.  I will call you with your results. If your blood work is normal we will follow-up yearly for physicals.  If anything is abnormal we will treat and get you in for a follow-up.  Please take the Mobic once daily with food.  Try to limit heavy lifting with the left arm while inflammation is calming down.  Follow-up with your Pulmonologist as scheduled.  Preventive Care for Adults, Male A healthy lifestyle and preventive care can promote health and wellness. Preventive health guidelines for men include the following key practices:  A routine yearly physical is a good way to check with your health care provider about your health and preventative screening. It is a chance to share any concerns and updates on your health and to receive a thorough exam.  Visit your dentist for a routine exam and preventative care every 6 months. Brush your teeth twice a day and floss once a day. Good oral hygiene prevents tooth decay and gum disease.  The frequency of eye exams is based on your age, health, family medical history, use of contact lenses, and other factors. Follow your health care provider's recommendations for frequency of eye exams.  Eat a healthy diet. Foods such as vegetables, fruits, whole grains, low-fat dairy products, and lean protein foods contain the nutrients you need without too many calories. Decrease your intake of foods high in solid fats, added sugars, and salt. Eat the right amount of calories for you.Get information about a proper diet from your health care provider, if necessary.  Regular physical exercise is one of the most important things you can do for your health. Most adults should get at least 150 minutes of moderate-intensity exercise (any activity that increases your heart rate and causes you to sweat) each week. In addition, most adults need muscle-strengthening exercises on 2 or more days a week.  Maintain a healthy weight. The body  mass index (BMI) is a screening tool to identify possible weight problems. It provides an estimate of body fat based on height and weight. Your health care provider can find your BMI and can help you achieve or maintain a healthy weight.For adults 20 years and older:  A BMI below 18.5 is considered underweight.  A BMI of 18.5 to 24.9 is normal.  A BMI of 25 to 29.9 is considered overweight.  A BMI of 30 and above is considered obese.  Maintain normal blood lipids and cholesterol levels by exercising and minimizing your intake of saturated fat. Eat a balanced diet with plenty of fruit and vegetables. Blood tests for lipids and cholesterol should begin at age 52 and be repeated every 5 years. If your lipid or cholesterol levels are high, you are over 50, or you are at high risk for heart disease, you may need your cholesterol levels checked more frequently.Ongoing high lipid and cholesterol levels should be treated with medicines if diet and exercise are not working.  If you smoke, find out from your health care provider how to quit. If you do not use tobacco, do not start.  Lung cancer screening is recommended for adults aged 61-80 years who are at high risk for developing lung cancer because of a history of smoking. A yearly low-dose CT scan of the lungs is recommended for people who have at least a 30-pack-year history of smoking and are a current smoker or have quit within the past 15 years. A pack year of smoking is smoking an  average of 1 pack of cigarettes a day for 1 year (for example: 1 pack a day for 30 years or 2 packs a day for 15 years). Yearly screening should continue until the smoker has stopped smoking for at least 15 years. Yearly screening should be stopped for people who develop a health problem that would prevent them from having lung cancer treatment.  If you choose to drink alcohol, do not have more than 2 drinks per day. One drink is considered to be 12 ounces (355 mL) of  beer, 5 ounces (148 mL) of wine, or 1.5 ounces (44 mL) of liquor.  Avoid use of street drugs. Do not share needles with anyone. Ask for help if you need support or instructions about stopping the use of drugs.  High blood pressure causes heart disease and increases the risk of stroke. Your blood pressure should be checked at least every 1-2 years. Ongoing high blood pressure should be treated with medicines, if weight loss and exercise are not effective.  If you are 41-33 years old, ask your health care provider if you should take aspirin to prevent heart disease.  Diabetes screening is done by taking a blood sample to check your blood glucose level after you have not eaten for a certain period of time (fasting). If you are not overweight and you do not have risk factors for diabetes, you should be screened once every 3 years starting at age 26. If you are overweight or obese and you are 29-98 years of age, you should be screened for diabetes every year as part of your cardiovascular risk assessment.  Colorectal cancer can be detected and often prevented. Most routine colorectal cancer screening begins at the age of 50 and continues through age 60. However, your health care provider may recommend screening at an earlier age if you have risk factors for colon cancer. On a yearly basis, your health care provider may provide home test kits to check for hidden blood in the stool. Use of a small camera at the end of a tube to directly examine the colon (sigmoidoscopy or colonoscopy) can detect the earliest forms of colorectal cancer. Talk to your health care provider about this at age 40, when routine screening begins. Direct exam of the colon should be repeated every 5-10 years through age 62, unless early forms of precancerous polyps or small growths are found.  People who are at an increased risk for hepatitis B should be screened for this virus. You are considered at high risk for hepatitis B if:  You  were born in a country where hepatitis B occurs often. Talk with your health care provider about which countries are considered high risk.  Your parents were born in a high-risk country and you have not received a shot to protect against hepatitis B (hepatitis B vaccine).  You have HIV or AIDS.  You use needles to inject street drugs.  You live with, or have sex with, someone who has hepatitis B.  You are a man who has sex with other men (MSM).  You get hemodialysis treatment.  You take certain medicines for conditions such as cancer, organ transplantation, and autoimmune conditions.  Hepatitis C blood testing is recommended for all people born from [birth year] through 1965 and any individual with known risks for hepatitis C.  Practice safe sex. Use condoms and avoid high-risk sexual practices to reduce the spread of sexually transmitted infections (STIs). STIs include gonorrhea, chlamydia, syphilis, trichomonas, herpes, HPV, and human  immunodeficiency virus (HIV). Herpes, HIV, and HPV are viral illnesses that have no cure. They can result in disability, cancer, and death.  If you are a man who has sex with other men, you should be screened at least once per year for:  HIV.  Urethral, rectal, and pharyngeal infection of gonorrhea, chlamydia, or both.  If you are at risk of being infected with HIV, it is recommended that you take a prescription medicine daily to prevent HIV infection. This is called preexposure prophylaxis (PrEP). You are considered at risk if:  You are a man who has sex with other men (MSM) and have other risk factors.  You are a heterosexual man, are sexually active, and are at increased risk for HIV infection.  You take drugs by injection.  You are sexually active with a partner who has HIV.  Talk with your health care provider about whether you are at high risk of being infected with HIV. If you choose to begin PrEP, you should first be tested for HIV. You should then  be tested every 3 months for as long as you are taking PrEP.  A one-time screening for abdominal aortic aneurysm (AAA) and surgical repair of large AAAs by ultrasound are recommended for men ages 56 to 36 years who are current or former smokers.  Healthy men should no longer receive prostate-specific antigen (PSA) blood tests as part of routine cancer screening. Talk with your health care provider about prostate cancer screening.  Testicular cancer screening is not recommended for adult males who have no symptoms. Screening includes self-exam, a health care provider exam, and other screening tests. Consult with your health care provider about any symptoms you have or any concerns you have about testicular cancer.  Use sunscreen. Apply sunscreen liberally and repeatedly throughout the day. You should seek shade when your shadow is shorter than you. Protect yourself by wearing long sleeves, pants, a wide-brimmed hat, and sunglasses year round, whenever you are outdoors.  Once a month, do a whole-body skin exam, using a mirror to look at the skin on your back. Tell your health care provider about new moles, moles that have irregular borders, moles that are larger than a pencil eraser, or moles that have changed in shape or color.  Stay current with required vaccines (immunizations).  Influenza vaccine. All adults should be immunized every year.  Tetanus, diphtheria, and acellular pertussis (Td, Tdap) vaccine. An adult who has not previously received Tdap or who does not know his vaccine status should receive 1 dose of Tdap. This initial dose should be followed by tetanus and diphtheria toxoids (Td) booster doses every 10 years. Adults with an unknown or incomplete history of completing a 3-dose immunization series with Td-containing vaccines should begin or complete a primary immunization series including a Tdap dose. Adults should receive a Td booster every 10 years.  Varicella vaccine. An adult  without evidence of immunity to varicella should receive 2 doses or a second dose if he has previously received 1 dose.  Human papillomavirus (HPV) vaccine. Males aged 11-21 years who have not received the vaccine previously should receive the 3-dose series. Males aged 22-26 years may be immunized. Immunization is recommended through the age of 106 years for any male who has sex with males and did not get any or all doses earlier. Immunization is recommended for any person with an immunocompromised condition through the age of 72 years if he did not get any or all doses earlier. During the  3-dose series, the second dose should be obtained 4-8 weeks after the first dose. The third dose should be obtained 24 weeks after the first dose and 16 weeks after the second dose.  Zoster vaccine. One dose is recommended for adults aged 28 years or older unless certain conditions are present.  Measles, mumps, and rubella (MMR) vaccine. Adults born before [birth year] generally are considered immune to measles and mumps. Adults born in [birth year] or later should have 1 or more doses of MMR vaccine unless there is a contraindication to the vaccine or there is laboratory evidence of immunity to each of the three diseases. A routine second dose of MMR vaccine should be obtained at least 28 days after the first dose for students attending postsecondary schools, health care workers, or international travelers. People who received inactivated measles vaccine or an unknown type of measles vaccine during 1963-1967 should receive 2 doses of MMR vaccine. People who received inactivated mumps vaccine or an unknown type of mumps vaccine before 1979 and are at high risk for mumps infection should consider immunization with 2 doses of MMR vaccine. Unvaccinated health care workers born before [birth year] who lack laboratory evidence of measles, mumps, or rubella immunity or laboratory confirmation of disease should consider measles and mumps immunization with  2 doses of MMR vaccine or rubella immunization with 1 dose of MMR vaccine.  Pneumococcal 13-valent conjugate (PCV13) vaccine. When indicated, a person who is uncertain of his immunization history and has no record of immunization should receive the PCV13 vaccine. All adults 32 years of age and older should receive this vaccine. An adult aged 58 years or older who has certain medical conditions and has not been previously immunized should receive 1 dose of PCV13 vaccine. This PCV13 should be followed with a dose of pneumococcal polysaccharide (PPSV23) vaccine. Adults who are at high risk for pneumococcal disease should obtain the PPSV23 vaccine at least 8 weeks after the dose of PCV13 vaccine. Adults older than 54 years of age who have normal immune system function should obtain the PPSV23 vaccine dose at least 1 year after the dose of PCV13 vaccine.  Pneumococcal polysaccharide (PPSV23) vaccine. When PCV13 is also indicated, PCV13 should be obtained first. All adults aged 71 years and older should be immunized. An adult younger than age 20 years who has certain medical conditions should be immunized. Any person who resides in a nursing home or long-term care facility should be immunized. An adult smoker should be immunized. People with an immunocompromised condition and certain other conditions should receive both PCV13 and PPSV23 vaccines. People with human immunodeficiency virus (HIV) infection should be immunized as soon as possible after diagnosis. Immunization during chemotherapy or radiation therapy should be avoided. Routine use of PPSV23 vaccine is not recommended for American Indians, [place] Natives, or people younger than 65 years unless there are medical conditions that require PPSV23 vaccine. When indicated, people who have unknown immunization and have no record of immunization should receive PPSV23 vaccine. One-time revaccination 5 years after the first dose of PPSV23 is recommended for people aged  19-64 years who have chronic kidney failure, nephrotic syndrome, asplenia, or immunocompromised conditions. People who received 1-2 doses of PPSV23 before age 56 years should receive another dose of PPSV23 vaccine at age 2 years or later if at least 5 years have passed since the previous dose. Doses of PPSV23 are not needed for people immunized with PPSV23 at or after age 11 years.  Meningococcal vaccine. Adults with asplenia or  persistent complement component deficiencies should receive 2 doses of quadrivalent meningococcal conjugate (MenACWY-D) vaccine. The doses should be obtained at least 2 months apart. Microbiologists working with certain meningococcal bacteria, [employer], people at risk during an outbreak, and people who travel to or live in countries with a high rate of meningitis should be immunized. A first-year college student up through age 66 years who is living in a residence hall should receive a dose if he did not receive a dose on or after his 16th birthday. Adults who have certain high-risk conditions should receive one or more doses of vaccine.  Hepatitis A vaccine. Adults who wish to be protected from this disease, have chronic liver disease, work with hepatitis A-infected animals, work in hepatitis A research labs, or travel to or work in countries with a high rate of hepatitis A should be immunized. Adults who were previously unvaccinated and who anticipate close contact with an international adoptee during the first 60 days after arrival in the [place] from a country with a high rate of hepatitis A should be immunized.  Hepatitis B vaccine. Adults should be immunized if they wish to be protected from this disease, are under age 19 years and have diabetes, have chronic liver disease, have had more than one sex partner in the past 6 months, may be exposed to blood or other infectious body fluids, are household contacts or sex partners of hepatitis B positive people, are  clients or workers in certain care facilities, or travel to or work in countries with a high rate of hepatitis B.  Haemophilus influenzae type b (Hib) vaccine. A previously unvaccinated person with asplenia or sickle cell disease or having a scheduled splenectomy should receive 1 dose of Hib vaccine. Regardless of previous immunization, a recipient of a hematopoietic stem cell transplant should receive a 3-dose series 6-12 months after his successful transplant. Hib vaccine is not recommended for adults with HIV infection. Preventive Service / Frequency Ages 6 to 22  Blood pressure check.** / Every 3-5 years.  Lipid and cholesterol check.** / Every 5 years beginning at age 29.  Hepatitis C blood test.** / For any individual with known risks for hepatitis C.  Skin self-exam. / Monthly.  Influenza vaccine. / Every year.  Tetanus, diphtheria, and acellular pertussis (Tdap, Td) vaccine.** / Consult your health care provider. 1 dose of Td every 10 years.  Varicella vaccine.** / Consult your health care provider.  HPV vaccine. / 3 doses over 6 months, if 81 or younger.  Measles, mumps, rubella (MMR) vaccine.** / You need at least 1 dose of MMR if you were born in 1957 or later. You may also need a second dose.  Pneumococcal 13-valent conjugate (PCV13) vaccine.** / Consult your health care provider.  Pneumococcal polysaccharide (PPSV23) vaccine.** / 1 to 2 doses if you smoke cigarettes or if you have certain conditions.  Meningococcal vaccine.** / 1 dose if you are age 71 to 63 years and a Market researcher living in a residence hall, or have one of several medical conditions. You may also need additional booster doses.  Hepatitis A vaccine.** / Consult your health care provider.  Hepatitis B vaccine.** / Consult your health care provider.  Haemophilus influenzae type b (Hib) vaccine.** / Consult your health care provider. Ages 81 to 82  Blood pressure check.** / Every  year.  Lipid and cholesterol check.** / Every 5 years beginning at age 69.  Lung cancer screening. / Every year if you are  aged 68-80 years and have a 30-pack-year history of smoking and currently smoke or have quit within the past 15 years. Yearly screening is stopped once you have quit smoking for at least 15 years or develop a health problem that would prevent you from having lung cancer treatment.  Fecal occult blood test (FOBT) of stool. / Every year beginning at age 30 and continuing until age 39. You may not have to do this test if you get a colonoscopy every 10 years.  Flexible sigmoidoscopy** or colonoscopy.** / Every 5 years for a flexible sigmoidoscopy or every 10 years for a colonoscopy beginning at age 82 and continuing until age 72.  Hepatitis C blood test.** / For all people born from [birth year] through 1965 and any individual with known risks for hepatitis C.  Skin self-exam. / Monthly.  Influenza vaccine. / Every year.  Tetanus, diphtheria, and acellular pertussis (Tdap/Td) vaccine.** / Consult your health care provider. 1 dose of Td every 10 years.  Varicella vaccine.** / Consult your health care provider.  Zoster vaccine.** / 1 dose for adults aged 44 years or older.  Measles, mumps, rubella (MMR) vaccine.** / You need at least 1 dose of MMR if you were born in 1957 or later. You may also need a second dose.  Pneumococcal 13-valent conjugate (PCV13) vaccine.** / Consult your health care provider.  Pneumococcal polysaccharide (PPSV23) vaccine.** / 1 to 2 doses if you smoke cigarettes or if you have certain conditions.  Meningococcal vaccine.** / Consult your health care provider.  Hepatitis A vaccine.** / Consult your health care provider.  Hepatitis B vaccine.** / Consult your health care provider.  Haemophilus influenzae type b (Hib) vaccine.** / Consult your health care provider. Ages 107 and over  Blood pressure check.** / Every year.  Lipid and cholesterol  check.**/ Every 5 years beginning at age 35.  Lung cancer screening. / Every year if you are aged 20-80 years and have a 30-pack-year history of smoking and currently smoke or have quit within the past 15 years. Yearly screening is stopped once you have quit smoking for at least 15 years or develop a health problem that would prevent you from having lung cancer treatment.  Fecal occult blood test (FOBT) of stool. / Every year beginning at age 49 and continuing until age 72. You may not have to do this test if you get a colonoscopy every 10 years.  Flexible sigmoidoscopy** or colonoscopy.** / Every 5 years for a flexible sigmoidoscopy or every 10 years for a colonoscopy beginning at age 73 and continuing until age 25.  Hepatitis C blood test.** / For all people born from [birth year] through 1965 and any individual with known risks for hepatitis C.  Abdominal aortic aneurysm (AAA) screening.** / A one-time screening for ages 88 to 52 years who are current or former smokers.  Skin self-exam. / Monthly.  Influenza vaccine. / Every year.  Tetanus, diphtheria, and acellular pertussis (Tdap/Td) vaccine.** / 1 dose of Td every 10 years.  Varicella vaccine.** / Consult your health care provider.  Zoster vaccine.** / 1 dose for adults aged 8 years or older.  Pneumococcal 13-valent conjugate (PCV13) vaccine.** / 1 dose for all adults aged 46 years and older.  Pneumococcal polysaccharide (PPSV23) vaccine.** / 1 dose for all adults aged 14 years and older.  Meningococcal vaccine.** / Consult your health care provider.  Hepatitis A vaccine.** / Consult your health care provider.  Hepatitis B vaccine.** / Consult your health care provider.  Haemophilus influenzae type b (Hib) vaccine.** / Consult your health care provider. **Family history and personal history of risk and conditions may change your health care provider's recommendations.   This information is not intended to replace advice given to you  by your health care provider. Make sure you discuss any questions you have with your health care provider.   Document Released: 01/11/2002 Document Revised: 12/06/2014 Document Reviewed: 04/12/2011 Elsevier Interactive Patient Education [employer].

## 2015-12-13 LAB — HIV ANTIBODY (ROUTINE TESTING W REFLEX): HIV 1&2 Ab, 4th Generation: NONREACTIVE

## 2015-12-15 ENCOUNTER — Encounter: Payer: Self-pay | Admitting: Gastroenterology

## 2016-01-30 ENCOUNTER — Ambulatory Visit (AMBULATORY_SURGERY_CENTER): Payer: Self-pay | Admitting: *Deleted

## 2016-01-30 VITALS — Ht 72.0 in | Wt 217.0 lb

## 2016-01-30 DIAGNOSIS — Z1211 Encounter for screening for malignant neoplasm of colon: Secondary | ICD-10-CM

## 2016-01-30 NOTE — Progress Notes (Signed)
No egg or soy allergy known to patient  No issues with past sedation with any surgeries  or procedures, no intubation problems  No diet pills per patient No home 02 use per patient  No blood thinners per patient   emmi declined

## 2016-02-06 ENCOUNTER — Ambulatory Visit (AMBULATORY_SURGERY_CENTER): Payer: 59 | Admitting: Gastroenterology

## 2016-02-06 ENCOUNTER — Encounter: Payer: Self-pay | Admitting: Gastroenterology

## 2016-02-06 VITALS — BP 128/87 | HR 67 | Temp 98.4°F | Resp 13 | Ht 72.0 in | Wt 217.0 lb

## 2016-02-06 DIAGNOSIS — D123 Benign neoplasm of transverse colon: Secondary | ICD-10-CM | POA: Diagnosis not present

## 2016-02-06 DIAGNOSIS — Z1211 Encounter for screening for malignant neoplasm of colon: Secondary | ICD-10-CM

## 2016-02-06 MED ORDER — SODIUM CHLORIDE 0.9 % IV SOLN: 500.0000 mL | INTRAVENOUS | Status: DC

## 2016-02-06 NOTE — Op Note (Signed)
Phillip [name]: Phillip [name] Procedure Date: 02/06/2016 9:33 AM MRN: [id number] Endoscopist: Mallie Mussel L. Loletha [name] , MD Age: 55 Referring MD:  Date of Birth: 11-25-1961 Gender: Male Procedure:                Colonoscopy Indications:              Screening for colorectal malignant neoplasm Medicines:                Monitored Anesthesia Care Procedure:                Pre-Anesthesia Assessment:                           - Prior to the procedure, a History and Physical                            was performed, and patient medications and                            allergies were reviewed. The patient's tolerance of                            previous anesthesia was also reviewed. The risks                            and benefits of the procedure and the sedation                            options and risks were discussed with the patient.                            All questions were answered, and informed consent                            was obtained. Prior Anticoagulants: The patient has                            taken no previous anticoagulant or antiplatelet                            agents. ASA Grade Assessment: II - A patient with                            mild systemic disease. After reviewing the risks                            and benefits, the patient was deemed in                            satisfactory condition to undergo the procedure.                           After obtaining informed consent, the colonoscope  was passed under direct vision. Throughout the                            procedure, the patient's blood pressure, pulse, and                            oxygen saturations were monitored continuously. The                            Model CF-HQ190L [id number]) scope was introduced                            through the anus and advanced to the the terminal                            ileum. The colonoscopy was performed  without                            difficulty. The patient tolerated the procedure                            well. The quality of the bowel preparation was good                            with Miralax. The terminal ileum, ileocecal valve,                            appendiceal orifice, and rectum were photographed. Scope In: 9:51:46 AM Scope Out: 10:21:27 AM Scope Withdrawal Time: 0 hours 26 minutes 41 seconds  Total Procedure Duration: 0 hours 29 minutes 41 seconds  Findings:      The terminal ileum contained a few ulcers. No bleeding was present.      Many medium-mouthed diverticula were found in the sigmoid colon.      Three sessile polyps were found in the proximal transverse colon and mid       transverse colon. The polyps were 2 to 6 mm in size. These polyps were       removed with a cold snare. Resection and retrieval were complete.      The exam was otherwise without abnormality on direct and retroflexion       views. Complications:            No immediate complications. Estimated Blood Loss:     Estimated blood loss: none. Impression:               - A few ulcers in the terminal ileum.                           - Diverticulosis in the sigmoid colon.                           - Three 2 to 6 mm polyps in the proximal transverse                            colon and in the mid transverse colon, removed  with                            a cold snare. Resected and retrieved.                           - The examination was otherwise normal on direct                            and retroflexion views. Recommendation:           - Patient has a contact number available for                            emergencies. The signs and symptoms of potential                            delayed complications were discussed with the                            patient. Return to normal activities tomorrow.                            Written discharge instructions were provided to the                             patient.                           - Resume previous diet.                           - Decrease use of NSAIDs because it is causing                            small ileal ulcers.                           - Repeat colonoscopy is recommended for                            surveillance. The colonoscopy date will be                            determined after pathology results from today's                            exam become available for review.                           - Post procedure medication instructions were                            provided to the patient. Procedure Code(s):        --- Professional ---  45385, Colonoscopy, flexible; with removal of                            tumor(s), polyp(s), or other lesion(s) by snare                            technique CPT copyright 2016 American Medical Association. All rights reserved. Phillip Sek L. Loletha Carrow, MD Estill Cotta Danis, MD 02/06/2016 10:32:10 AM Number of Addenda: 0

## 2016-02-06 NOTE — Progress Notes (Signed)
Called to room to assist during endoscopic procedure.  Patient ID and intended procedure confirmed with present staff. Received instructions for my participation in the procedure from the performing physician.  

## 2016-02-06 NOTE — Patient Instructions (Addendum)
Decrease use of NSAIDs because it is causing small ulcers in the small intestine (see photos).  YOU HAD AN ENDOSCOPIC PROCEDURE TODAY AT [facility]:   Refer to the procedure report that was given to you for any specific questions about what was found during the examination.  If the procedure report does not answer your questions, please call your gastroenterologist to clarify.  If you requested that your care partner not be given the details of your procedure findings, then the procedure report has been included in a sealed envelope for you to review at your convenience later.  YOU SHOULD EXPECT: Some feelings of bloating in the abdomen. Passage of more gas than usual.  Walking can help get rid of the air that was put into your GI tract during the procedure and reduce the bloating. If you had a lower endoscopy (such as a colonoscopy or flexible sigmoidoscopy) you may notice spotting of blood in your stool or on the toilet paper. If you underwent a bowel prep for your procedure, you may not have a normal bowel movement for a few days.  Please Note:  You might notice some irritation and congestion in your nose or some drainage.  This is from the oxygen used during your procedure.  There is no need for concern and it should clear up in a day or so.  SYMPTOMS TO REPORT IMMEDIATELY:   Following lower endoscopy (colonoscopy or flexible sigmoidoscopy):  Excessive amounts of blood in the stool  Significant tenderness or worsening of abdominal pains  Swelling of the abdomen that is new, acute  Fever of 100F or higher  For urgent or emergent issues, a gastroenterologist can be reached at any hour by calling [phone].   DIET: Your first meal following the procedure should be a small meal and then it is ok to progress to your normal diet. Heavy or fried foods are harder to digest and may make you feel nauseous or bloated.  Likewise, meals heavy in dairy and vegetables can increase  bloating.  Drink plenty of fluids but you should avoid alcoholic beverages for 24 hours.  ACTIVITY:  You should plan to take it easy for the rest of today and you should NOT DRIVE or use heavy machinery until tomorrow (because of the sedation medicines used during the test).    FOLLOW UP: Our staff will call the number listed on your records the next business day following your procedure to check on you and address any questions or concerns that you may have regarding the information given to you following your procedure. If we do not reach you, we will leave a message.  However, if you are feeling well and you are not experiencing any problems, there is no need to return our call.  We will assume that you have returned to your regular daily activities without incident.  If any biopsies were taken you will be contacted by phone or by letter within the next 1-3 weeks.  Please call us at [phone] if you have not heard about the biopsies in 3 weeks.    SIGNATURES/CONFIDENTIALITY: You and/or your care partner have signed paperwork which will be entered into your electronic medical record.  These signatures attest to the fact that that the information above on your After Visit Summary has been reviewed and is understood.  Full responsibility of the confidentiality of this discharge information lies with you and/or your care-partner.  Please review polyp, diverticulosis, and high fiber diet  handouts provided. Next colonoscopy determined by pathology results.

## 2016-02-06 NOTE — Progress Notes (Signed)
Report to PACU, RN, vss, BBS= Clear.  

## 2016-02-09 ENCOUNTER — Telehealth: Payer: Self-pay

## 2016-02-09 NOTE — Telephone Encounter (Signed)
  Follow up Call-  Call back number 02/06/2016  Post procedure Call Back phone  # [phone]  Permission to leave phone message Yes    Patient was called for follow up after procedure on 02/06/2016. No answer at the number given for follow up phone call. A message was left on the answering machine.

## 2016-02-12 ENCOUNTER — Encounter: Payer: Self-pay | Admitting: Gastroenterology

## 2016-02-23 ENCOUNTER — Encounter: Payer: 59 | Admitting: Gastroenterology

## 2016-03-19 ENCOUNTER — Ambulatory Visit: Payer: 59 | Admitting: Pulmonary Disease

## 2016-03-24 ENCOUNTER — Other Ambulatory Visit: Payer: Self-pay | Admitting: Pulmonary Disease

## 2016-04-30 ENCOUNTER — Encounter: Payer: Self-pay | Admitting: Pulmonary Disease

## 2016-04-30 ENCOUNTER — Ambulatory Visit (INDEPENDENT_AMBULATORY_CARE_PROVIDER_SITE_OTHER): Payer: 59 | Admitting: Pulmonary Disease

## 2016-04-30 VITALS — BP 128/74 | HR 85 | Ht 72.0 in | Wt 216.0 lb

## 2016-04-30 DIAGNOSIS — J301 Allergic rhinitis due to pollen: Secondary | ICD-10-CM | POA: Diagnosis not present

## 2016-04-30 DIAGNOSIS — J4540 Moderate persistent asthma, uncomplicated: Secondary | ICD-10-CM

## 2016-04-30 MED ORDER — BECLOMETHASONE DIPROPIONATE 80 MCG/ACT IN AERS: 2.0000 | INHALATION_SPRAY | Freq: Two times a day (BID) | RESPIRATORY_TRACT | Status: DC

## 2016-04-30 NOTE — Patient Instructions (Signed)
Stopped taking Advair Take Qvar 2 puffs twice a day, if you have no symptoms after 2 weeks please call me and then let me know so we can call in a prescription for the Qvar  I recommend that she take Qvar 2 puffs twice a day for 2 months, if no symptoms then go down to 1 puff twice a day for a month and then you could stop.  However, I recommend that you restart Qvar in the spring of 2018 prior to pollen coming out.  In addition, in the spring of 2018 I recommend the following: Use Milta Deiters Med rinses with distilled water at least twice per day using the instructions on the package. 1/2 hour after using the [facility] Med rinse, use Nasacort two puffs in each nostril once per day.  Remember that the Nasacort can take 1-2 weeks to work after regular use. Use generic zyrtec (cetirizine) every day.  If this doesn't help, then stop taking it and use chlorpheniramine-phenylephrine combination tablets.  We will see you back in one year or sooner if needed

## 2016-04-30 NOTE — Assessment & Plan Note (Signed)
This is been a stable interval for Phillip [name] in regards to his asthma. Over the last year he has not had any exacerbations and he has barely needed to use albuterol. Based on the fact that he had normal spirometry in 2012 and he has essentially never used albuterol in the last several months, we could back off on his controller therapy at this time.  Plan: Start stepdown and controller therapy: Stop Advair, start Qvar 2 puffs twice a day, I advised him that he could decrease this to 1 puff twice a day after 2 months if he's not having further symptoms. It would be okay for him to stop Qvar in the winter if he is doing okay, but I advised him to restart it in the late winter before the tree pollen comes out Follow-up one year

## 2016-04-30 NOTE — Assessment & Plan Note (Signed)
He had more problems with allergic rhinitis this year than anything else. This is in part because he does not believe in the efficacy of nasal steroids.  Today I explained to him in great detail the validity of nasal steroid use and basic pharmacokinetics and use.  Plan: He was given the following advice: Use Neil Med rinses with distilled water at least twice per day using the instructions on the package. 1/2 hour after using the [facility] Med rinse, use Nasacort two puffs in each nostril once per day.  Remember that the Nasacort can take 1-2 weeks to work after regular use. Use generic zyrtec (cetirizine) every day.  If this doesn't help, then stop taking it and use chlorpheniramine-phenylephrine combination tablets.

## 2016-04-30 NOTE — Progress Notes (Signed)
Subjective:    Patient ID: Phillip [name], male    DOB: 1961-01-18, 55 y.o.   MRN: [id number]  Synopsis: Former patient of Dr. Gwenette Greet who has allergic rhinitis and asthma. Simple spirometry testing in 2012 was normal.  HPI Chief Complaint  Patient presents with  . Follow-up    former [facility] pt being treated for asthma/allergic rhinitis.  Pt states he had a difficulty spring with seasonal allergies but is doing well overall.    Phillip [name] had asthma as a kid and did fairly well until age 52 when his symptoms flared up again with wheezing.  He has been on Advair since last year but he has been on various inhaled steroids over the years. He has been taking the Advair HFA.  He feels like it works well, he takes it regularly.  He sometimes wonder if he really needs it because he never really gets bad asthma attacks again.  The bulk of his sympotms are sinus congestion, itching and burning eyes, and feeling "clogged up".  He is not taking xyzal which is working [employer].  He kep s a loose phlegm that he coughs up a lot.  He never took the [employer] gave him a year ago.    Past Medical History  Diagnosis Date  . Asthma     Followed by Pulmonology  . Chicken pox   . Allergy   . GERD (gastroesophageal reflux disease)       Review of Systems     Objective:   Physical Exam Filed Vitals:   04/30/16 1400  BP: 128/74  Pulse: 85  Height: 6' (1.829 m)  Weight: 216 lb (97.977 kg)  SpO2: 98%   RA  Gen: well appearing, no acute distress HENT: NCAT, OP clear, neck supple without masses Eyes: PERRL, EOMi Lymph: no cervical lymphadenopathy PULM: CTA B CV: RRR, no mgr, no JVD GI: BS+, soft, nontender, no hsm Derm: no rash or skin breakdown MSK: normal bulk and tone Neuro: A&Ox4, CN II-XII intact, strength 5/5 in all 4 extremities Psyche: normal mood and affect   Records from Dr. Gwenette Greet reviewed where he was prescribed Advair and Singulair for his asthma and allergic rhinitis  CBC      Component Value Date/Time   WBC 6.7 12/12/2015 0837   RBC 5.47 12/12/2015 0837   HGB 16.9 12/12/2015 0837   HCT 49.8 12/12/2015 0837   PLT 286.0 12/12/2015 0837   MCV 91.1 12/12/2015 0837   MCHC 33.9 12/12/2015 0837   RDW 12.8 12/12/2015 0837         Assessment & Plan:  Moderate persistent asthma in adult without complication This is been a stable interval for Phillip [name] in regards to his asthma. Over the last year he has not had any exacerbations and he has barely needed to use albuterol. Based on the fact that he had normal spirometry in 2012 and he has essentially never used albuterol in the last several months, we could back off on his controller therapy at this time.  Plan: Start stepdown and controller therapy: Stop Advair, start Qvar 2 puffs twice a day, I advised him that he could decrease this to 1 puff twice a day after 2 months if he's not having further symptoms. It would be okay for him to stop Qvar in the winter if he is doing okay, but I advised him to restart it in the late winter before the tree pollen comes out Follow-up one year  Allergic rhinitis He had  more problems with allergic rhinitis this year than anything else. This is in part because he does not believe in the efficacy of nasal steroids.  Today I explained to him in great detail the validity of nasal steroid use and basic pharmacokinetics and use.  Plan: He was given the following advice: Use Neil Med rinses with distilled water at least twice per day using the instructions on the package. 1/2 hour after using the [facility] Med rinse, use Nasacort two puffs in each nostril once per day.  Remember that the Nasacort can take 1-2 weeks to work after regular use. Use generic zyrtec (cetirizine) every day.  If this doesn't help, then stop taking it and use chlorpheniramine-phenylephrine combination tablets.    > 25 minutes spent face to face today, > 40 minute visit total   Current outpatient prescriptions:  .   ADVAIR HFA 115-21 MCG/ACT inhaler, INHALE 2 PUFFS BY MOUTH INTO THE LUNGS 2 (TWO) TIMES DAILY., Disp: 12 g, Rfl: 1 .  albuterol (VENTOLIN HFA) 108 (90 BASE) MCG/ACT inhaler, Inhale 2 puffs into the lungs every 6 (six) hours as needed., Disp: 1 Inhaler, Rfl: 4 .  diphenhydrAMINE (BENADRYL) 25 MG tablet, Take 25 mg by mouth at bedtime as needed., Disp: , Rfl:  .  ibuprofen (ADVIL,MOTRIN) 200 MG tablet, Take 200 mg by mouth every 6 (six) hours as needed., Disp: , Rfl:  .  levocetirizine (XYZAL) 5 MG tablet, Take 5 mg by mouth every evening., Disp: , Rfl:  .  meloxicam (MOBIC) 15 MG tablet, Take 1 tablet (15 mg total) by mouth daily., Disp: 30 tablet, Rfl: 0 .  OVER THE COUNTER MEDICATION, Omeprazole-As needed, Disp: , Rfl:  .  triamcinolone cream (KENALOG) 0.1 %, Reported on 01/30/2016, Disp: , Rfl: 0 .  beclomethasone (QVAR) 80 MCG/ACT inhaler, Inhale 2 puffs into the lungs 2 (two) times daily., Disp: 1 Inhaler, Rfl: 0

## 2016-06-02 ENCOUNTER — Telehealth: Payer: Self-pay | Admitting: Pulmonary Disease

## 2016-06-02 MED ORDER — BECLOMETHASONE DIPROPIONATE 80 MCG/ACT IN AERS: 2.0000 | INHALATION_SPRAY | Freq: Two times a day (BID) | RESPIRATORY_TRACT | Status: DC

## 2016-06-02 NOTE — Telephone Encounter (Signed)
Last ov with BQ on 04/30/16  Patient Instructions       Stopped taking Advair Take Qvar 2 puffs twice a day, if you have no symptoms after 2 weeks please call me and then let me know so we can call in a prescription for the Qvar  I recommend that she take Qvar 2 puffs twice a day for 2 months, if no symptoms then go down to 1 puff twice a day for a month and then you could stop.  However, I recommend that you restart Qvar in the spring of 2018 prior to pollen coming out.  In addition, in the spring of 2018 I recommend the following: Use Milta Deiters Med rinses with distilled water at least twice per day using the instructions on the package. 1/2 hour after using the [facility] Med rinse, use Nasacort two puffs in each nostril once per day.  Remember that the Nasacort can take 1-2 weeks to work after regular use. Use generic zyrtec (cetirizine) every day.  If this doesn't help, then stop taking it and use chlorpheniramine-phenylephrine combination tablets.  We will see you back in one year or sooner if needed   Called spoke with pt. Pt states that Qvar is working well and would like a rx sent into [facility] in [place]. He voiced understanding and had no further questions. Rx sent. Nothing further needed.

## 2016-10-01 ENCOUNTER — Encounter: Payer: Self-pay | Admitting: Physician Assistant

## 2016-10-01 ENCOUNTER — Ambulatory Visit (INDEPENDENT_AMBULATORY_CARE_PROVIDER_SITE_OTHER): Payer: 59 | Admitting: Physician Assistant

## 2016-10-01 VITALS — BP 139/91 | HR 77 | Temp 98.1°F | Ht 72.0 in | Wt 223.0 lb

## 2016-10-01 DIAGNOSIS — R29810 Facial weakness: Secondary | ICD-10-CM | POA: Diagnosis not present

## 2016-10-01 DIAGNOSIS — R481 Agnosia: Secondary | ICD-10-CM

## 2016-10-01 NOTE — Progress Notes (Signed)
Pre visit review using our clinic tool,if applicable. No additional management support is needed unless otherwise documented below in the visit note.  

## 2016-10-01 NOTE — Progress Notes (Signed)
Patient presents to clinic today c/o acute onset of left-sided facial weakness occurring 2 weeks prior, preceded by gradual onset of decreased ability to taste. 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 Notes this feels similar to him. Patient endorses initial weakness associated with eye drooping. Noted was hard to move muscles of L side of face. Denies any headache, vision changes, focal extremity weakness or issue with balance. Endorses facial weakness has slowly began improving since onset. Notes change in taste is still present. Denies change in smell.  Has been able to open and close eye with ease. Still notes weakness with smiling. Denies any new or worsening symptoms. Patient also denies any preceding rash, cough, chest congestion, sinus pain. Notes intermittent sinus pressure 2/2 chronic allergies. Is taking medications as directed.   Past Medical History:  Diagnosis Date  . Allergy   . Asthma    Followed by Pulmonology  . Chicken pox   . GERD (gastroesophageal reflux disease)     Current Outpatient Prescriptions on File Prior to Visit  Medication Sig Dispense Refill  . ADVAIR HFA 115-21 MCG/ACT inhaler INHALE 2 PUFFS BY MOUTH INTO THE LUNGS 2 (TWO) TIMES DAILY. 12 g 1  . albuterol (VENTOLIN HFA) 108 (90 BASE) MCG/ACT inhaler Inhale 2 puffs into the lungs every 6 (six) hours as needed. 1 Inhaler 4  . beclomethasone (QVAR) 80 MCG/ACT inhaler Inhale 2 puffs into the lungs 2 (two) times daily. 1 Inhaler 5  . diphenhydrAMINE (BENADRYL) 25 MG tablet Take 25 mg by mouth at bedtime as needed.    Marland Kitchen ibuprofen (ADVIL,MOTRIN) 200 MG tablet Take 200 mg by mouth every 6 (six) hours as needed.    Marland Kitchen levocetirizine (XYZAL) 5 MG tablet Take 5 mg by mouth every evening.    . meloxicam (MOBIC) 15 MG tablet Take 1 tablet (15 mg total) by mouth daily. 30 tablet 0  . OVER THE COUNTER MEDICATION Omeprazole-As needed    . triamcinolone cream (KENALOG) 0.1 % Reported on  01/30/2016  0   No current facility-administered medications on file prior to visit.     Allergies  Allergen Reactions  . Penicillins Rash    Childhood reaction    Family History  Problem Relation Age of Onset  . Hodgkin's lymphoma Child     Remission  . Hypertension Mother     Controlled with diet  . Hyperlipidemia Mother   . Emphysema Maternal Uncle   . Colon cancer Neg Hx   . Colon polyps Neg Hx     Social History   Social History  . Marital status: Married    Spouse name: N/A  . Number of children: N/A  . Years of education: N/A   Social History Main Topics  . Smoking status: Never Smoker  . Smokeless tobacco: Never Used  . Alcohol use 0.0 oz/week     Comment: socially  . Drug use: No  . Sexual activity: Not Asked   Other Topics Concern  . None   Social History Narrative  . None    Review of Systems - See HPI.  All other ROS are negative.  BP (!) 139/91   Pulse 77   Temp 98.1 F (36.7 C) (Oral)   Ht 6' (1.829 m)   Wt 223 lb (101.2 kg)   SpO2 97%   BMI 30.24 kg/m   Physical Exam  Constitutional: He is oriented to person, place, and time and well-developed, well-nourished, and in no  distress.  HENT:  Head: Normocephalic and atraumatic.  Right Ear: External ear normal.  Left Ear: External ear normal.  Nose: Nose normal.  Mouth/Throat: No uvula swelling.  Eyes: Conjunctivae and EOM are normal. Pupils are equal, round, and reactive to light.  Neck: Neck supple.  Cardiovascular: Normal rate, regular rhythm, normal heart sounds and intact distal pulses.   Pulmonary/Chest: Effort normal and breath sounds normal. No respiratory distress. He has no wheezes. He has no rales. He exhibits no tenderness.  Neurological: He is alert and oriented to person, place, and time. He has normal sensation, normal strength and normal reflexes. Gait normal. Gait normal.  On examination patient without abnormal finding when asked to raise eyebrows, or puff out cheeks.  Strength seems present and equal bilaterally. There is slight decrease in elevation of L-side of mouth on smiling. Sensation is intact. There is slight R-sided deviation of uvula with testing CN IX. Otherwise neurological examination is within normal limits.  Skin: Skin is warm and dry. No rash noted.  Psychiatric: Affect normal.  Vitals reviewed.  Assessment/Plan: 1. Loss of perception for taste With slight noted R-sided uvula deviation on CN testing. Symptoms present for 2 weeks. Other symptoms seem consistent with a Bells Palsy attack but giving this finding, will check MRI Brain urgently for further assessment. If normal, may need Neurology referral as this is the predominant, non-improving symptom ar present.  - MR Brain Wo Contrast; Future - MR Brain Wo Contrast; Future  2. Facial weakness Suspect Bell's Palsy attack but some atypical features. Symptoms improving and plan on this continuing. Giving some atypical symptoms, will obtain STAT MRI to further assess and to determine treatment and next steps.  - MR Brain Wo Contrast; Future - MR Brain Wo Contrast; Future   [name], PA-C

## 2016-10-01 NOTE — Patient Instructions (Signed)
Please go downstairs to schedule your MRI. Your Neuro exam shows good strength without weakness but this neurological symptoms, taste and weakness are concerning. MRI is to r/o lesion or recent stroke.  If you note any new symptoms or any recurrence of facial weakness/paralysis, please go to the ER.

## 2016-10-02 ENCOUNTER — Other Ambulatory Visit: Payer: Self-pay | Admitting: Physician Assistant

## 2016-10-02 ENCOUNTER — Ambulatory Visit (HOSPITAL_BASED_OUTPATIENT_CLINIC_OR_DEPARTMENT_OTHER): Payer: 59

## 2016-10-02 ENCOUNTER — Ambulatory Visit (HOSPITAL_BASED_OUTPATIENT_CLINIC_OR_DEPARTMENT_OTHER)
Admission: RE | Admit: 2016-10-02 | Discharge: 2016-10-02 | Disposition: A | Discharge status: Home / Self Care | Payer: 59 | Source: Ambulatory Visit | Attending: Physician Assistant | Admitting: Physician Assistant

## 2016-10-02 DIAGNOSIS — R29810 Facial weakness: Secondary | ICD-10-CM

## 2016-10-02 DIAGNOSIS — J349 Unspecified disorder of nose and nasal sinuses: Secondary | ICD-10-CM | POA: Diagnosis not present

## 2016-10-02 DIAGNOSIS — R481 Agnosia: Secondary | ICD-10-CM

## 2016-10-02 MED ORDER — PREDNISONE 50 MG PO TABS: ORAL_TABLET | ORAL | 0 refills | Status: DC

## 2016-12-10 ENCOUNTER — Other Ambulatory Visit: Payer: Self-pay | Admitting: Pulmonary Disease

## 2017-02-28 DIAGNOSIS — L570 Actinic keratosis: Secondary | ICD-10-CM | POA: Diagnosis not present

## 2017-06-02 ENCOUNTER — Other Ambulatory Visit: Payer: Self-pay

## 2017-06-02 MED ORDER — BECLOMETHASONE DIPROP HFA 80 MCG/ACT IN AERB: 2.0000 | INHALATION_SPRAY | Freq: Two times a day (BID) | RESPIRATORY_TRACT | 11 refills | Status: DC

## 2017-08-19 ENCOUNTER — Telehealth: Payer: Self-pay | Admitting: Physician Assistant

## 2017-08-19 NOTE — Telephone Encounter (Signed)
Patient would like to transfer care from [facility] to Dr. Nani Ravens due to PCP relocating to [place] and location being to far, please advise

## 2017-08-19 NOTE — Telephone Encounter (Signed)
Fine with me

## 2017-08-20 NOTE — Telephone Encounter (Signed)
OK w me.  

## 2017-08-22 NOTE — Telephone Encounter (Signed)
Patient scheduled with Dr. Nani Ravens at 09/01/2017

## 2017-09-01 ENCOUNTER — Encounter: Payer: Self-pay | Admitting: Family Medicine

## 2017-09-01 ENCOUNTER — Ambulatory Visit (INDEPENDENT_AMBULATORY_CARE_PROVIDER_SITE_OTHER): Payer: 59 | Admitting: Family Medicine

## 2017-09-01 VITALS — BP 142/100 | HR 85 | Temp 97.9°F | Ht 72.0 in | Wt 220.5 lb

## 2017-09-01 DIAGNOSIS — Z23 Encounter for immunization: Secondary | ICD-10-CM

## 2017-09-01 DIAGNOSIS — R030 Elevated blood-pressure reading, without diagnosis of hypertension: Secondary | ICD-10-CM

## 2017-09-01 DIAGNOSIS — Z0000 Encounter for general adult medical examination without abnormal findings: Secondary | ICD-10-CM | POA: Diagnosis not present

## 2017-09-01 DIAGNOSIS — Z1159 Encounter for screening for other viral diseases: Secondary | ICD-10-CM | POA: Diagnosis not present

## 2017-09-01 DIAGNOSIS — Z125 Encounter for screening for malignant neoplasm of prostate: Secondary | ICD-10-CM | POA: Diagnosis not present

## 2017-09-01 LAB — COMPREHENSIVE METABOLIC PANEL
ALT: 61 U/L — ABNORMAL HIGH (ref 0–53)
AST: 33 U/L (ref 0–37)
Albumin: 4.5 g/dL (ref 3.5–5.2)
Alkaline Phosphatase: 47 U/L (ref 39–117)
BUN: 9 mg/dL (ref 6–23)
CHLORIDE: 103 meq/L (ref 96–112)
CO2: 30 meq/L (ref 19–32)
CREATININE: 1.01 mg/dL (ref 0.40–1.50)
Calcium: 9.5 mg/dL (ref 8.4–10.5)
GFR: 81.42 mL/min (ref >60.00)
Glucose, Bld: 116 mg/dL — ABNORMAL HIGH (ref 70–99)
Potassium: 4.2 mEq/L (ref 3.5–5.1)
SODIUM: 140 meq/L (ref 135–145)
Total Bilirubin: 0.5 mg/dL (ref 0.2–1.2)
Total Protein: 7 g/dL (ref 6.0–8.3)

## 2017-09-01 LAB — CBC
HCT: 49.2 % (ref 39.0–52.0)
Hemoglobin: 16.6 g/dL (ref 13.0–17.0)
MCHC: 33.7 g/dL (ref 30.0–36.0)
MCV: 95.3 fl (ref 78.0–100.0)
Platelets: 240 10*3/uL (ref 150.0–400.0)
RBC: 5.16 Mil/uL (ref 4.22–5.81)
RDW: 13.1 % (ref 11.5–15.5)
WBC: 6.3 10*3/uL (ref 4.0–10.5)

## 2017-09-01 LAB — LIPID PANEL
CHOL/HDL RATIO: 5
Cholesterol: 220 mg/dL — ABNORMAL HIGH (ref 0–200)
HDL: 41.3 mg/dL (ref >39.00)
LDL CALC: 151 mg/dL — AB (ref 0–99)
NonHDL: 178.45
TRIGLYCERIDES: 135 mg/dL (ref 0.0–149.0)
VLDL: 27 mg/dL (ref 0.0–40.0)

## 2017-09-01 LAB — PSA: PSA: 1.5 ng/mL (ref 0.10–4.00)

## 2017-09-01 NOTE — Addendum Note (Signed)
Addended by: Sharon Seller B on: 09/01/2017 08:30 AM   Modules accepted: Orders

## 2017-09-01 NOTE — Progress Notes (Signed)
Chief Complaint  Patient presents with  . Annual Exam    Well Male Phillip [name] is here for a complete physical.   His last physical was >1 year ago.  Current diet: in general, a "healthy"; does not eat healthy when he travels Current exercise: walking Weight trend: stable Does pt snore? Intermittent. Daytime fatigue? No. Seat belt? Yes.    Health maintenance Colonoscopy- No Tetanus- No- 2007 HIV- Yes  Hep C- No Prostate cancer screening- Yes   BP Weight gain, BP's have been creeping up. Wife is nurse, checked at home, has been elevated recently. No CP or SOB. +famhx of HTN. No exercise. Poor diet. Home readings 150-160/80-90's.   Past Medical History:  Diagnosis Date  . Allergy   . Asthma    Followed by Pulmonology  . Chicken pox   . GERD (gastroesophageal reflux disease)     Past Surgical History:  Procedure Laterality Date  . DENTAL SURGERY    . LEG SURGERY Left 2007  . MASTECTOMY    . WISDOM TOOTH EXTRACTION     Medications  Current Outpatient Prescriptions on File Prior to Visit  Medication Sig Dispense Refill  . beclomethasone (QVAR REDIHALER) 80 MCG/ACT inhaler Inhale 2 puffs into the lungs 2 (two) times daily. 1 Inhaler 11  . OVER THE COUNTER MEDICATION Omeprazole-As needed    . albuterol (VENTOLIN HFA) 108 (90 BASE) MCG/ACT inhaler Inhale 2 puffs into the lungs every 6 (six) hours as needed. (Patient not taking: Reported on 09/01/2017) 1 Inhaler 4  . diphenhydrAMINE (BENADRYL) 25 MG tablet Take 25 mg by mouth at bedtime as needed.    Marland Kitchen ibuprofen (ADVIL,MOTRIN) 200 MG tablet Take 200 mg by mouth every 6 (six) hours as needed.    Marland Kitchen levocetirizine (XYZAL) 5 MG tablet Take 5 mg by mouth every evening.    . triamcinolone cream (KENALOG) 0.1 % Reported on 01/30/2016  0   Allergies Allergies  Allergen Reactions  . Penicillins Rash    Childhood reaction   Family History Family History  Problem Relation Age of Onset  . Hodgkin's lymphoma Child    Remission  . Hypertension Mother        Controlled with diet  . Hyperlipidemia Mother   . Emphysema Maternal Uncle   . Colon cancer Neg Hx   . Colon polyps Neg Hx     Review of Systems: Constitutional:  no unexpected change in weight, no fevers or chills Eye:  no recent significant change in vision Ear/Nose/Mouth/Throat:  Ears:  no tinnitus or hearing loss Nose/Mouth/Throat:  no complaints of nasal congestion or bleeding, no sore throat and oral sores Cardiovascular:  no chest pain, no palpitations Respiratory:  no cough and no shortness of breath Gastrointestinal:  no abdominal pain, no change in bowel habits, no nausea, vomiting, diarrhea, or constipation and no black or bloody stool GU:  Male: negative for dysuria, frequency, and incontinence and negative for prostate symptoms Musculoskeletal/Extremities: +intermittent joint pains; otherwise no pain, redness, or swelling of the joints Integumentary (Skin/Breast):  no abnormal skin lesions reported Neurologic:  no headaches, no numbness, tingling Endocrine: No weight changes, masses in the neck, heat/cold intolerance, bowel or skin changes, or cardiovascular system symptoms Hematologic/Lymphatic:  no abnormal bleeding, no HIV risk factors, no night sweats, no swollen nodes, no weight loss  Exam BP (!) 142/100 (BP Location: Left Arm, Patient Position: Sitting, Cuff Size: Large)   Pulse 85   Temp 97.9 F (36.6 C) (Oral)   Ht  6' (1.829 m)   Wt 220 lb 8 oz (100 kg)   SpO2 97%   BMI 29.91 kg/m  General:  well developed, well nourished, in no apparent distress Skin:  no significant moles, warts, or growths Head:  no masses, lesions, or tenderness Eyes:  pupils equal and round, sclera anicteric without injection Ears:  canals without lesions, TMs shiny without retraction, no obvious effusion, no erythema Nose:  nares patent, septum midline, mucosa normal Throat/Pharynx:  lips and gingiva without lesion; tongue and uvula midline;  non-inflamed pharynx; no exudates or postnasal drainage Neck: neck supple without adenopathy, thyromegaly, or masses Lungs:  clear to auscultation, breath sounds equal bilaterally, no respiratory distress Cardio:  regular rate and rhythm without murmurs, heart sounds without clicks or rubs Abdomen:  abdomen soft, nontender; bowel sounds normal; no masses or organomegaly Genital (male): circumcised penis, no lesions or discharge; testes present bilaterally without masses or tenderness Rectal: Deferred Musculoskeletal:  symmetrical muscle groups noted without atrophy or deformity Extremities:  no clubbing, cyanosis, or edema, no deformities, no skin discoloration Neuro:  gait normal; deep tendon reflexes normal and symmetric Psych: well oriented with normal range of affect and appropriate judgment/insight  Assessment and Plan  Well adult exam - Plan: Lipid panel, CBC, Comprehensive metabolic panel  Need for influenza vaccination - Plan: Flu Vaccine QUAD 6+ mos PF IM (Fluarix Quad PF)  Encounter for hepatitis C screening test for low risk patient - Plan: Hepatitis C antibody  Screening for prostate cancer - Plan: PSA  Elevated blood pressure reading   Well 56 y.o. male. Counseled on risks and benefits of prostate cancer screening with PSA. The patient agrees to undergo testing. Immunizations, labs, and further orders as above. Discussed options w BP, pt opted for lifestyle modifications. Healthy diet handout given. Counseled on diet and exercise. Goal wt loss of 3-5 lbs. If no improvement, will discuss medication. Discussed home BP monitoring.  Follow up 6 weeks to recheck BP. The patient voiced understanding and agreement to the plan.  [place], DO 09/01/17 8:16 AM

## 2017-09-01 NOTE — Progress Notes (Signed)
Pre visit review using our clinic review tool, if applicable. No additional management support is needed unless otherwise documented below in the visit note. 

## 2017-09-01 NOTE — Patient Instructions (Addendum)
Around 3 times per week, check your blood pressure 4 times per day. Twice in the morning and twice in the evening. The readings should be at least one minute apart. Write down these values and bring them to your next nurse visit/appointment.  When you check your BP, make sure you have been doing something calm/relaxing 5 minutes prior to checking. Both feet should be flat on the floor and you should be sitting. Use your left arm and make sure it is in a relaxed position (on a table), and that the cuff is at the approximate level/height of your heart.  Weight loss goal: 3-5 lbs  Aim to do some physical exertion for 150 minutes per week. This is typically divided into 5 days per week, 30 minutes per day. The activity should be enough to get your heart rate up. Anything is better than nothing if you have time constraints.  Give [place] 2-3 business days to get the results of your labs back. If labs are normal, you will likely receive a letter in the mail unless you have MyChart. This can take longer than 2-3 business days.   Healthy Eating Plan Many factors influence your heart health, including eating and exercise habits. Heart (coronary) risk increases with abnormal blood fat (lipid) levels. Heart-healthy meal planning includes limiting unhealthy fats, increasing healthy fats, and making other small dietary changes. This includes maintaining a healthy body weight to help keep lipid levels within a normal range.  WHAT IS MY PLAN?  Your health care provider recommends that you:  Drink a glass of water before meals to help with satiety.  Eat slowly.  An alternative to the water is to add Metamucil. This will help with satiety as well. It does contain calories, unlike water.  WHAT TYPES OF FAT SHOULD I CHOOSE?  Choose healthy fats more often. Choose monounsaturated and polyunsaturated fats, such as olive oil and canola oil, flaxseeds, walnuts, almonds, and seeds.  Eat more omega-3 fats. Good choices  include salmon, mackerel, sardines, tuna, flaxseed oil, and ground flaxseeds. Aim to eat fish at least two times each week.  Avoid foods with partially hydrogenated oils in them. These contain trans fats. Examples of foods that contain trans fats are stick margarine, some tub margarines, cookies, crackers, and other baked goods. If you are going to avoid a fat, this is the one to avoid!  WHAT GENERAL GUIDELINES DO I NEED TO FOLLOW?  Check food labels carefully to identify foods with trans fats. Avoid these types of options when possible.  Fill one half of your plate with vegetables and green salads. Eat 4-5 servings of vegetables per day. A serving of vegetables equals 1 cup of raw leafy vegetables,  cup of raw or cooked cut-up vegetables, or  cup of vegetable juice.  Fill one fourth of your plate with whole grains. Look for the word "whole" as the first word in the ingredient list.  Fill one fourth of your plate with lean protein foods.  Eat 4-5 servings of fruit per day. A serving of fruit equals one medium whole fruit,  cup of dried fruit,  cup of fresh, frozen, or canned fruit. Try to avoid fruits in cups/syrups as the sugar content can be high.  Eat more foods that contain soluble fiber. Examples of foods that contain this type of fiber are apples, broccoli, carrots, beans, peas, and barley. Aim to get 20-30 g of fiber per day.  Eat more home-cooked food and less restaurant, buffet, and  fast food.  Limit or avoid alcohol.  Limit foods that are high in starch and sugar.  Avoid fried foods when able.  Cook foods by using methods other than frying. Baking, boiling, grilling, and broiling are all great options. Other fat-reducing suggestions include: ? Removing the skin from poultry. ? Removing all visible fats from meats. ? Skimming the fat off of stews, soups, and gravies before serving them. ? Steaming vegetables in water or broth.  Lose weight if you are overweight. Losing  just 5-10% of your initial body weight can help your overall health and prevent diseases such as diabetes and heart disease.  Increase your consumption of nuts, legumes, and seeds to 4-5 servings per week. One serving of dried beans or legumes equals  cup after being cooked, one serving of nuts equals 1 ounces, and one serving of seeds equals  ounce or 1 tablespoon.  WHAT ARE GOOD FOODS CAN I EAT? Grains Grainy breads (try to find bread that is 3 g of fiber per slice or greater), oatmeal, light popcorn. Whole-grain cereals. Rice and pasta, including brown rice and those that are made with whole wheat. Edamame pasta is a great alternative to grain pasta. It has a higher protein content. Try to avoid significant consumption of white bread, sugary cereals, or pastries/baked goods.  Vegetables All vegetables. Cooked white potatoes do not count as vegetables.  Fruits All fruits, but limit pineapple and bananas as these fruits have a higher sugar content.  Meats and Other Protein Sources Lean, well-trimmed beef, veal, pork, and lamb. Chicken and [place] without skin. All fish and shellfish. Wild duck, rabbit, pheasant, and venison. Egg whites or low-cholesterol egg substitutes. Dried beans, peas, lentils, and tofu.Seeds and most nuts.  Dairy Low-fat or nonfat cheeses, including ricotta, string, and mozzarella. Skim or 1% milk that is liquid, powdered, or evaporated. Buttermilk that is made with low-fat milk. Nonfat or low-fat yogurt. Soy/Almond milk are good alternatives if you cannot handle dairy.  Beverages Water is the best for you. Sports drinks with less sugar are more desirable unless you are a highly active athlete.  Sweets and Desserts Sherbets and fruit ices. Honey, jam, marmalade, jelly, and syrups. Dark chocolate.  Eat all sweets and desserts in moderation.  Fats and Oils Nonhydrogenated (trans-free) margarines. Vegetable oils, including soybean, sesame, sunflower, olive, peanut,  safflower, corn, canola, and cottonseed. Salad dressings or mayonnaise that are made with a vegetable oil. Limit added fats and oils that you use for cooking, baking, salads, and as spreads.  Other Cocoa powder. Coffee and tea. Most condiments.  The items listed above may not be a complete list of recommended foods or beverages. Contact your dietitian for more options.

## 2017-09-02 ENCOUNTER — Other Ambulatory Visit: Payer: Self-pay | Admitting: Family Medicine

## 2017-09-02 DIAGNOSIS — R7401 Elevation of levels of liver transaminase levels: Secondary | ICD-10-CM

## 2017-09-02 DIAGNOSIS — R740 Nonspecific elevation of levels of transaminase and lactic acid dehydrogenase [LDH]: Principal | ICD-10-CM

## 2017-09-02 LAB — HEPATITIS C ANTIBODY
Hepatitis C Ab: NONREACTIVE
SIGNAL TO CUT-OFF: 0.01 (ref <1.00)

## 2017-09-02 NOTE — Progress Notes (Signed)
Future orders per result note placed. Pt notified via [place].

## 2017-09-12 ENCOUNTER — Other Ambulatory Visit (INDEPENDENT_AMBULATORY_CARE_PROVIDER_SITE_OTHER): Payer: 59

## 2017-09-12 DIAGNOSIS — R740 Nonspecific elevation of levels of transaminase and lactic acid dehydrogenase [LDH]: Secondary | ICD-10-CM

## 2017-09-12 DIAGNOSIS — R7401 Elevation of levels of liver transaminase levels: Secondary | ICD-10-CM

## 2017-09-12 LAB — IBC PANEL
Iron: 122 ug/dL (ref 42–165)
SATURATION RATIOS: 37.6 % (ref 20.0–50.0)
Transferrin: 232 mg/dL (ref 212.0–360.0)

## 2017-09-12 LAB — HEPATIC FUNCTION PANEL
ALT: 51 U/L (ref 0–53)
AST: 28 U/L (ref 0–37)
Albumin: 4.4 g/dL (ref 3.5–5.2)
Alkaline Phosphatase: 42 U/L (ref 39–117)
BILIRUBIN DIRECT: 0.1 mg/dL (ref 0.0–0.3)
BILIRUBIN TOTAL: 0.6 mg/dL (ref 0.2–1.2)
Total Protein: 7.1 g/dL (ref 6.0–8.3)

## 2017-09-12 LAB — FERRITIN: Ferritin: 222.8 ng/mL (ref 22.0–322.0)

## 2017-09-13 LAB — HEPATITIS B SURFACE ANTIGEN: Hepatitis B Surface Ag: NONREACTIVE

## 2017-10-17 ENCOUNTER — Ambulatory Visit: Payer: 59 | Admitting: Family Medicine

## 2017-10-17 ENCOUNTER — Encounter: Payer: Self-pay | Admitting: Family Medicine

## 2017-10-17 VITALS — BP 120/88 | HR 66 | Temp 97.8°F | Ht 72.0 in | Wt 215.2 lb

## 2017-10-17 DIAGNOSIS — I10 Essential (primary) hypertension: Secondary | ICD-10-CM

## 2017-10-17 NOTE — Progress Notes (Signed)
Chief Complaint  Patient presents with  . Hypertension    Subjective Phillip [name] is a 56 y.o. male who presents for hypertension follow up. He does monitor home blood pressures. Blood pressures ranging from 140's/80-90's on average. He is not on any medications as he elected to fix things with lifestyle changes. He is adhering to a healthy diet overall. Current exercise: going to gym- running/walking, some lifting weights   Past Medical History:  Diagnosis Date  . Allergy   . Asthma    Followed by Pulmonology  . Chicken pox   . GERD (gastroesophageal reflux disease)      Medications Current Outpatient Medications on File Prior to Visit  Medication Sig Dispense Refill  . albuterol (VENTOLIN HFA) 108 (90 BASE) MCG/ACT inhaler Inhale 2 puffs into the lungs every 6 (six) hours as needed. (Patient not taking: Reported on 09/01/2017) 1 Inhaler 4  . beclomethasone (QVAR REDIHALER) 80 MCG/ACT inhaler Inhale 2 puffs into the lungs 2 (two) times daily. 1 Inhaler 11  . diphenhydrAMINE (BENADRYL) 25 MG tablet Take 25 mg by mouth at bedtime as needed.    Marland Kitchen ibuprofen (ADVIL,MOTRIN) 200 MG tablet Take 200 mg by mouth every 6 (six) hours as needed.    Marland Kitchen levocetirizine (XYZAL) 5 MG tablet Take 5 mg by mouth every evening.    Marland Kitchen OVER THE COUNTER MEDICATION Omeprazole-As needed    . triamcinolone cream (KENALOG) 0.1 % Reported on 01/30/2016  0    Review of Systems Cardiovascular: no chest pain Respiratory:  no shortness of breath  Exam BP 120/88 (BP Location: Left Arm, Patient Position: Sitting, Cuff Size: Large)   Pulse 66   Temp 97.8 F (36.6 C) (Oral)   Ht 6' (1.829 m)   Wt 215 lb 4 oz (97.6 kg)   SpO2 96%   BMI 29.19 kg/m  General:  well developed, well nourished, in no apparent distress Heart: RRR, no bruits, no LE edema Lungs: clear to auscultation, no accessory muscle use Psych: well oriented with normal range of affect and appropriate judgment/insight  Essential  hypertension  Will cont to hold off on meds as he is doing a good job with diet/exercise. Cont checking BP at home, bring cuff and readings to nurse visit in 6 weeks. The patient voiced understanding and agreement to the plan.  [place], DO 10/17/17  12:10 PM

## 2017-10-17 NOTE — Progress Notes (Signed)
Pre visit review using our clinic review tool, if applicable. No additional management support is needed unless otherwise documented below in the visit note. 

## 2017-10-17 NOTE — Patient Instructions (Signed)
Bring your blood pressure monitor to your nurse visit. Continue to check at home. Bring your log to the appointment as well.  Keep up the great work.

## 2018-05-18 ENCOUNTER — Emergency Department (HOSPITAL_BASED_OUTPATIENT_CLINIC_OR_DEPARTMENT_OTHER)
Admission: EM | Admit: 2018-05-18 | Discharge: 2018-05-18 | Disposition: A | Discharge status: Home / Self Care | Payer: 59 | Attending: Emergency Medicine | Admitting: Emergency Medicine

## 2018-05-18 ENCOUNTER — Encounter (HOSPITAL_BASED_OUTPATIENT_CLINIC_OR_DEPARTMENT_OTHER): Payer: Self-pay | Admitting: *Deleted

## 2018-05-18 ENCOUNTER — Other Ambulatory Visit: Payer: Self-pay

## 2018-05-18 ENCOUNTER — Emergency Department (HOSPITAL_BASED_OUTPATIENT_CLINIC_OR_DEPARTMENT_OTHER): Payer: 59

## 2018-05-18 DIAGNOSIS — R0602 Shortness of breath: Secondary | ICD-10-CM | POA: Diagnosis not present

## 2018-05-18 DIAGNOSIS — J45909 Unspecified asthma, uncomplicated: Secondary | ICD-10-CM | POA: Diagnosis not present

## 2018-05-18 DIAGNOSIS — Z79899 Other long term (current) drug therapy: Secondary | ICD-10-CM | POA: Diagnosis not present

## 2018-05-18 DIAGNOSIS — R079 Chest pain, unspecified: Secondary | ICD-10-CM | POA: Insufficient documentation

## 2018-05-18 DIAGNOSIS — R0789 Other chest pain: Secondary | ICD-10-CM | POA: Diagnosis not present

## 2018-05-18 LAB — COMPREHENSIVE METABOLIC PANEL
ALBUMIN: 4.3 g/dL (ref 3.5–5.0)
ALT: 37 U/L (ref 17–63)
ANION GAP: 9 (ref 5–15)
AST: 25 U/L (ref 15–41)
Alkaline Phosphatase: 51 U/L (ref 38–126)
BUN: 9 mg/dL (ref 6–20)
CALCIUM: 9 mg/dL (ref 8.9–10.3)
CHLORIDE: 103 mmol/L (ref 101–111)
CO2: 28 mmol/L (ref 22–32)
Creatinine, Ser: 1.08 mg/dL (ref 0.61–1.24)
GFR calc Af Amer: >60 mL/min (ref >60)
GFR calc non Af Amer: >60 mL/min (ref >60)
GLUCOSE: 140 mg/dL — AB (ref 65–99)
Potassium: 3.5 mmol/L (ref 3.5–5.1)
SODIUM: 140 mmol/L (ref 135–145)
Total Bilirubin: 0.7 mg/dL (ref 0.3–1.2)
Total Protein: 7.4 g/dL (ref 6.5–8.1)

## 2018-05-18 LAB — CBC WITH DIFFERENTIAL/PLATELET
BASOS PCT: 1 %
Basophils Absolute: 0.1 10*3/uL (ref 0.0–0.1)
Eosinophils Absolute: 0.7 10*3/uL (ref 0.0–0.7)
Eosinophils Relative: 8 %
HEMATOCRIT: 47.2 % (ref 39.0–52.0)
HEMOGLOBIN: 16.5 g/dL (ref 13.0–17.0)
LYMPHS ABS: 1.7 10*3/uL (ref 0.7–4.0)
Lymphocytes Relative: 20 %
MCH: 31.9 pg (ref 26.0–34.0)
MCHC: 35 g/dL (ref 30.0–36.0)
MCV: 91.3 fL (ref 78.0–100.0)
MONO ABS: 0.6 10*3/uL (ref 0.1–1.0)
MONOS PCT: 7 %
NEUTROS ABS: 5.5 10*3/uL (ref 1.7–7.7)
Neutrophils Relative %: 64 %
Platelets: 255 10*3/uL (ref 150–400)
RBC: 5.17 MIL/uL (ref 4.22–5.81)
RDW: 12.3 % (ref 11.5–15.5)
WBC: 8.6 10*3/uL (ref 4.0–10.5)

## 2018-05-18 LAB — TROPONIN I: Troponin I: <0.03 ng/mL (ref <0.03)

## 2018-05-18 LAB — LIPASE, BLOOD: Lipase: 35 U/L (ref 11–51)

## 2018-05-18 MED ORDER — IOPAMIDOL (ISOVUE-370) INJECTION 76%
100.0000 mL | Freq: Once | INTRAVENOUS | Status: AC | PRN
  Administered 2018-05-18: 100 mL via INTRAVENOUS

## 2018-05-18 MED ORDER — KETOROLAC TROMETHAMINE 30 MG/ML IJ SOLN
30.0000 mg | Freq: Once | INTRAMUSCULAR | Status: AC
  Administered 2018-05-18: 30 mg via INTRAVENOUS
  Filled 2018-05-18: qty 1

## 2018-05-18 MED ORDER — GI COCKTAIL ~~LOC~~
30.0000 mL | Freq: Once | ORAL | Status: AC
  Administered 2018-05-18: 30 mL via ORAL
  Filled 2018-05-18: qty 30

## 2018-05-18 MED ORDER — IBUPROFEN 800 MG PO TABS: 800.0000 mg | ORAL_TABLET | Freq: Three times a day (TID) | ORAL | 0 refills | Status: DC

## 2018-05-18 NOTE — ED Provider Notes (Signed)
9:43 AM Assumed care from Dr. Stark Jock, please see their note for full history, physical and decision making until this point. In brief this is a 57 y.o. year old male who presented to the ED tonight with Chest Pain     Patient was pending second troponin and CT scan which were both unremarkable aside from a 5.2 cm dilated aortic root which she is aware of and will get followed up. Further evaluation patient still with some chest discomfort but much improved after a GI cocktail.  He really thinks it is related to reflux however he does describe that the symptoms are better with leaning forward and worse when laying flat which would be more consistent with a pericarditis picture.  Give a dose of Toradol which seemed to almost alleviate his symptoms.  There is no evidence of pericardial effusion on the CT scan nor was there any vital sign abnormalities that worry me for eminent cardiac compromise.  Patient does have a history of reflux and sounds like some type of esophageal erosions so used ibuprofen sparingly over the next couple days.  Caution and that if he had any worsening of his indigestion or vomiting blood to stop immediately and get evaluated.  Otherwise will follow up with cardiology for his chest pain, dilated aortic root.  Discharge instructions, including strict return precautions for new or worsening symptoms, given. Patient and/or family verbalized understanding and agreement with the plan as described.     Labs, studies and imaging reviewed by myself and considered in medical decision making if ordered. Imaging interpreted by radiology.  Labs Reviewed  COMPREHENSIVE METABOLIC PANEL - Abnormal; Notable for the following components:      Result Value   Glucose, Bld 140 (*)    All other components within normal limits  CBC WITH DIFFERENTIAL/PLATELET  LIPASE, BLOOD  TROPONIN I  TROPONIN I    CT Angio Chest PE W and/or Wo Contrast  Final Result    DG Chest 2 View  Final Result       No follow-ups on file.    Della Homan, Corene Cornea, MD 05/18/18 [phone]

## 2018-05-18 NOTE — ED Triage Notes (Signed)
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 No relief with tums or pepcid. Took ASA 325mg  PTA. RAtes 7/10. Wife at [facility].   Alert, NAD, calm, interactive, resps e/u, speaking in clear complete sentences, no dyspnea noted, skin W&D, VSS, BP elevated, (denies: fever, NVD, dizziness or visual changes).

## 2018-05-18 NOTE — ED Provider Notes (Signed)
[facility] EMERGENCY DEPARTMENT Provider Note   CSN: [id number] Arrival date & time: 05/18/18  5188     History   Chief Complaint Chief Complaint  Patient presents with  . Chest Pain    HPI Phillip [name] is a 57 y.o. male.  This patient is a 57 year old male with past medical history of asthma and GERD.  He presents today for evaluation of upper abdominal and chest discomfort.  This started approximately 130 this morning and woke him from sleep.  He reports having [place] food yesterday for lunch, then green peppers yesterday evening at dinnertime.  Green peppers have been a trigger for his reflux and indigestion in the past and he initially thought this was the problem, however the pain seems to be located further up in his chest.  He denies any shortness of breath, diaphoresis, or radiation to the arm or jaw.  He denies any recent exertional symptoms.  He has no prior cardiac history and no cardiac risk factors.  The history is provided by the patient.  Chest Pain   This is a new problem. Episode onset: 1:30 AM. The problem occurs constantly. The problem has been gradually worsening. Associated with: Sleeping. The pain is present in the substernal region. The pain is moderate. The quality of the pain is described as pressure-like. The pain does not radiate. Pertinent negatives include no cough, no diaphoresis, no shortness of breath and no sputum production. He has tried nothing for the symptoms. There are no known risk factors.    Past Medical History:  Diagnosis Date  . Allergy   . Asthma    Followed by Pulmonology  . Chicken pox   . GERD (gastroesophageal reflux disease)     Patient Active Problem List   Diagnosis Date Noted  . Visit for preventive health examination 12/12/2015  . Prostate cancer screening 12/12/2015  . Screening for HIV without presence of risk factors 12/12/2015  . 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 Allergic rhinitis 03/17/2015  .  Moderate persistent asthma in adult without complication 41/66/0630    Past Surgical History:  Procedure Laterality Date  . DENTAL SURGERY    . LEG SURGERY Left 2007  . MASTECTOMY    . WISDOM TOOTH EXTRACTION          Home Medications    Prior to Admission medications   Medication Sig Start Date End Date Taking? Authorizing Provider  albuterol (VENTOLIN HFA) 108 (90 BASE) MCG/ACT inhaler Inhale 2 puffs into the lungs every 6 (six) hours as needed. 03/17/15   ClanceArmando Reichert, MD  beclomethasone (QVAR REDIHALER) 80 MCG/ACT inhaler Inhale 2 puffs into the lungs 2 (two) times daily. 06/02/17   Juanito Doom, MD  diphenhydrAMINE (BENADRYL) 25 MG tablet Take 25 mg by mouth at bedtime as needed.    [provider]  ibuprofen (ADVIL,MOTRIN) 200 MG tablet Take 200 mg by mouth every 6 (six) hours as needed.    [provider]  levocetirizine (XYZAL) 5 MG tablet Take 5 mg by mouth every evening.    [provider]  OVER THE COUNTER MEDICATION Omeprazole-As needed    [provider]  triamcinolone cream (KENALOG) 0.1 % Reported on 01/30/2016 12/12/15   [provider]    Family History Family History  Problem Relation Age of Onset  . Hypertension Mother        Controlled with diet  . Hyperlipidemia Mother   . Hodgkin's lymphoma Child  Remission  . Emphysema Maternal Uncle   . Colon cancer Neg Hx   . Colon polyps Neg Hx     Social History Social History   Tobacco Use  . Smoking status: Never Smoker  . Smokeless tobacco: Never Used  Substance Use Topics  . Alcohol use: Yes    Alcohol/week: 0.0 oz    Comment: socially  . Drug use: No     Allergies   Penicillins   Review of Systems Review of Systems  Constitutional: Negative for diaphoresis.  Respiratory: Negative for cough, sputum production and shortness of breath.   Cardiovascular: Positive for chest pain.  All other systems reviewed and are negative.    Physical  Exam Updated Vital Signs BP (!) 169/102 (BP Location: Left Arm)   Pulse 82   Temp 98 F (36.7 C) (Oral)   Resp 20   Ht 6' (1.829 m)   Wt 97.5 kg (215 lb)   SpO2 98%   BMI 29.16 kg/m   Physical Exam  Constitutional: He is oriented to person, place, and time. He appears well-developed and well-nourished. No distress.  HENT:  Head: Normocephalic and atraumatic.  Mouth/Throat: Oropharynx is clear and moist.  Neck: Normal range of motion. Neck supple.  Cardiovascular: Normal rate and regular rhythm. Exam reveals no friction rub.  No murmur heard. Pulmonary/Chest: Effort normal and breath sounds normal. No respiratory distress. He has no wheezes. He has no rales.  Abdominal: Soft. Bowel sounds are normal. He exhibits no distension. There is no tenderness.  Musculoskeletal: Normal range of motion. He exhibits no edema.  Neurological: He is alert and oriented to person, place, and time. Coordination normal.  Skin: Skin is warm and dry. He is not diaphoretic.  Nursing note and vitals reviewed.    ED Treatments / Results  Labs (all labs ordered are listed, but only abnormal results are displayed) Labs Reviewed  COMPREHENSIVE METABOLIC PANEL - Abnormal; Notable for the following components:      Result Value   Glucose, Bld 140 (*)    All other components within normal limits  CBC WITH DIFFERENTIAL/PLATELET  LIPASE, BLOOD  TROPONIN I    EKG EKG Interpretation  Date/Time:  Thursday May 18 2018 03:36:51 EDT Ventricular Rate:  84 PR Interval:  146 QRS Duration: 94 QT Interval:  402 QTC Calculation: 475 R Axis:   -29 Text Interpretation:  Normal sinus rhythm Incomplete right bundle branch block Borderline ECG Confirmed by Veryl Speak [phone]) on 05/18/2018 4:49:46 AM   Radiology Dg Chest 2 View  Result Date: 05/18/2018 CLINICAL DATA:  Mid upper anterior chest pain and indigestion after eating green peppers yesterday. Shortness of breath. EXAM: CHEST - 2 VIEW COMPARISON:   10/14/2008 FINDINGS: Normal heart size and pulmonary vascularity. No focal airspace disease or consolidation in the lungs. No blunting of costophrenic angles. No pneumothorax. Mediastinal contours appear intact. Slight fibrosis or atelectasis in the lung bases. IMPRESSION: Slight fibrosis or atelectasis in the lung bases. No evidence of active pulmonary disease. Electronically Signed   By: Lucienne Capers M.D.   On: 05/18/2018 04:36    Procedures Procedures (including critical care time)  Medications Ordered in ED Medications  gi cocktail (Maalox,Lidocaine,Donnatal) (has no administration in time range)     Initial Impression / Assessment and Plan / ED Course  I have reviewed the triage vital signs and the nursing notes.  Pertinent labs & imaging results that were available during my care of the patient were reviewed by me and  considered in my medical decision making (see chart for details).  Patient with chest pain that started in the night.  Initial troponin is negative and a delta troponin will be obtained.  He will also undergo CT scan to rule out the possibility of pulmonary embolism or other intrathoracic pathology.  Care will be signed out to Dr. Dayna Barker at shift change awaiting the results of the studies.  Final Clinical Impressions(s) / ED Diagnoses   Final diagnoses:  None    ED Discharge Orders    None       Veryl Speak, MD 05/19/18 [phone]

## 2018-05-19 ENCOUNTER — Telehealth: Payer: Self-pay

## 2018-05-19 NOTE — Telephone Encounter (Signed)
TCM ED follow up call made to patient to schedule ED follow up with Dr. Nani Ravens. Patient states he has scheduled an appointment with Cardiology and will call to schedule appointment with Dr. Nani Ravens if he feels he needs to after seeing Cardiologist. Otherwise he will keep his October appointment for physical.

## 2018-05-29 ENCOUNTER — Ambulatory Visit: Payer: 59 | Admitting: Family Medicine

## 2018-05-29 ENCOUNTER — Encounter: Payer: Self-pay | Admitting: Family Medicine

## 2018-05-29 VITALS — BP 112/70 | HR 80 | Temp 97.9°F | Ht 72.0 in | Wt 212.0 lb

## 2018-05-29 DIAGNOSIS — R0789 Other chest pain: Secondary | ICD-10-CM

## 2018-05-29 NOTE — Patient Instructions (Signed)
Your blood pressure looks good today. I think a good goal would be 130 or less on the top and less than 80 on the bottom.  Check blood pressure around 3 times per week. Alternate the times of day you check. If you are getting higher readings, wait a minute and check again.  Aim to do some physical exertion for 150 minutes per week. This is typically divided into 5 days per week, 30 minutes per day. The activity should be enough to get your heart rate up. Anything is better than nothing if you have time constraints.  Keep the diet clean.  Let us know if you need anything.

## 2018-05-29 NOTE — Progress Notes (Signed)
Chief Complaint  Patient presents with  . Hospitalization Follow-up    BP    Subjective Phillip [name] is a 57 y.o. male who presents for blood pressure follow up. I had seen him at the end of 2018 for pre-hypertension/elevated blood pressure readings.  We had elected not to start any medication at that time.  His wife is a cardiac nurse.  He recently had an emergency department visit for atypical chest pain.  His blood pressures were mildly elevated at that visit and his wife suggested he come in for discussion of blood pressure management.  Since I have seen him last, he started a new job where he travels less.  He is eating out less and controlling his portions better.  He has lost around 10 pounds and is feeling much better.  He reports leading up to his ER visit, he had green peppers that serve as a known trigger for his reflux.  He also was working on his car that could have affected his muscles in the chest.  He also had a dental procedure that caused him to have jaw pain.  Patient does have follow-up with cardiology in October.  A CT scan was done of his chest that did show dilatation of the aorta and that is why he is seeing the cardiology team.   Past Medical History:  Diagnosis Date  . Allergy   . Asthma    Followed by Pulmonology  . Chicken pox   . GERD (gastroesophageal reflux disease)     Review of Systems Cardiovascular: no chest pain Respiratory:  no shortness of breath  Exam BP 112/70 (BP Location: Left Arm, Patient Position: Sitting, Cuff Size: Normal)   Pulse 80   Temp 97.9 F (36.6 C) (Oral)   Ht 6' (1.829 m)   Wt 212 lb (96.2 kg)   SpO2 93%   BMI 28.75 kg/m  General:  well developed, well nourished, in no apparent distress Heart: RRR, no bruits, no LE edema Lungs: clear to auscultation, no accessory muscle use Psych: well oriented with normal range of affect and appropriate judgment/insight  Atypical chest pain  The patient's blood pressure is perfect  today.  I recommended he check at home.  Given the concern and dilatation of the aortic root, would recommend blood pressure goal of 213 or less systolic and less than 80 diastolic.   Counseled on diet and exercise. F/u for CPE as scheduled. The patient voiced understanding and agreement to the plan.  [place], DO 05/29/18  12:51 PM

## 2018-05-29 NOTE — Progress Notes (Signed)
Pre visit review using our clinic review tool, if applicable. No additional management support is needed unless otherwise documented below in the visit note. 

## 2018-05-31 ENCOUNTER — Encounter: Payer: Self-pay | Admitting: Family Medicine

## 2018-06-02 ENCOUNTER — Ambulatory Visit (INDEPENDENT_AMBULATORY_CARE_PROVIDER_SITE_OTHER): Payer: 59

## 2018-06-02 VITALS — BP 142/102 | HR 75

## 2018-06-02 DIAGNOSIS — I10 Essential (primary) hypertension: Secondary | ICD-10-CM

## 2018-06-02 MED ORDER — AMLODIPINE BESYLATE 5 MG PO TABS: 5.0000 mg | ORAL_TABLET | Freq: Every day | ORAL | 0 refills | Status: DC

## 2018-06-02 NOTE — Progress Notes (Signed)
Patient came in today fo rhis blood pressure check he is currently not taking any medicaitons for his blood pressure.   He brought in his Blood pressure machine to check with the machine here in the office.  Patients blood pressure with his own cuff: 167/106  Left arm pulse 76  159/110 right  Pulse 76   Patiens blood pressure taking manually:  142/86 left arm  Pulse 75  142/102 right arm  Pulse 75   Per Dr. Nani Ravens he wants the patient to start Amlodipine 5mg  once a day  And return in 4 weeks to follow with PCP. Patient voiced his understanding and made appt to see PCp 06/23/18 @ 7am.   Medication sent to pharmacy for a 30 day supply.

## 2018-06-23 ENCOUNTER — Ambulatory Visit: Payer: 59 | Admitting: Family Medicine

## 2018-06-23 ENCOUNTER — Encounter: Payer: Self-pay | Admitting: Family Medicine

## 2018-06-23 VITALS — BP 128/84 | HR 78 | Temp 97.8°F | Ht 72.0 in | Wt 212.4 lb

## 2018-06-23 DIAGNOSIS — I10 Essential (primary) hypertension: Secondary | ICD-10-CM

## 2018-06-23 NOTE — Patient Instructions (Addendum)
I would continue the Norvasc for now.  We could consider treating the blood pressure to 120 or less on top (systolic) until you see the cardiologist. We would use a specific medicine called a beta blocker like Coreg. If you and your wife are interested in this, send me a MyChart message.  Keep the diet clean and stay physically active.  Let us know if you need anything.

## 2018-06-23 NOTE — Progress Notes (Signed)
Chief Complaint  Patient presents with  . Hypertension    Subjective Phillip [name] is a 57 y.o. male who presents for hypertension follow up. He does monitor home blood pressures. Blood pressures ranging from 130's/80's on average. He is compliant with medications. Patient has these side effects of medication: none He is adhering to a healthy diet overall. Current exercise: physically active at home, walking   Past Medical History:  Diagnosis Date  . Allergy   . Asthma    Followed by Pulmonology  . Chicken pox   . GERD (gastroesophageal reflux disease)     Review of Systems Cardiovascular: no chest pain Respiratory:  no shortness of breath  Exam BP 128/84 (BP Location: Left Arm, Patient Position: Sitting, Cuff Size: Normal)   Pulse 78   Temp 97.8 F (36.6 C) (Oral)   Ht 6' (1.829 m)   Wt 212 lb 6 oz (96.3 kg)   SpO2 93%   BMI 28.80 kg/m  General:  well developed, well nourished, in no apparent distress Heart: RRR, no bruits, no LE edema Lungs: clear to auscultation, no accessory muscle use Psych: well oriented with normal range of affect and appropriate judgment/insight  Essential hypertension  Cont Norvasc for now. Discussed starting a more intensive BP regimen to treat to <120 SBP and starting a BB. He will discuss with his wife and send a message. He does have an appt with cardiology in Oct.  Counseled on diet and exercise. F/u in around 1 mo. The patient voiced understanding and agreement to the plan.  [place], DO 06/23/18  7:22 AM

## 2018-06-23 NOTE — Progress Notes (Signed)
Pre visit review using our clinic review tool, if applicable. No additional management support is needed unless otherwise documented below in the visit note. 

## 2018-06-28 ENCOUNTER — Encounter: Payer: Self-pay | Admitting: Family Medicine

## 2018-06-29 MED ORDER — AMLODIPINE BESYLATE 10 MG PO TABS: 10.0000 mg | ORAL_TABLET | Freq: Every day | ORAL | 3 refills | Status: DC

## 2018-07-26 ENCOUNTER — Other Ambulatory Visit: Payer: Self-pay | Admitting: Pulmonary Disease

## 2018-07-26 DIAGNOSIS — L720 Epidermal cyst: Secondary | ICD-10-CM | POA: Diagnosis not present

## 2018-07-26 DIAGNOSIS — Z872 Personal history of diseases of the skin and subcutaneous tissue: Secondary | ICD-10-CM | POA: Diagnosis not present

## 2018-07-26 DIAGNOSIS — L570 Actinic keratosis: Secondary | ICD-10-CM | POA: Diagnosis not present

## 2018-07-28 ENCOUNTER — Other Ambulatory Visit: Payer: Self-pay | Admitting: Pulmonary Disease

## 2018-08-10 ENCOUNTER — Telehealth: Payer: Self-pay | Admitting: Pulmonary Disease

## 2018-08-10 MED ORDER — BUDESONIDE-FORMOTEROL FUMARATE 160-4.5 MCG/ACT IN AERO: 2.0000 | INHALATION_SPRAY | Freq: Two times a day (BID) | RESPIRATORY_TRACT | 0 refills | Status: DC

## 2018-08-10 NOTE — Telephone Encounter (Signed)
Spoke with pt. He is requesting a refill on Symbicort. Pt has a pending OV with BQ on 08/25/18. Rx has been sent in. Nothing further was needed.

## 2018-08-25 ENCOUNTER — Encounter: Payer: Self-pay | Admitting: Pulmonary Disease

## 2018-08-25 ENCOUNTER — Ambulatory Visit: Payer: 59 | Admitting: Pulmonary Disease

## 2018-08-25 VITALS — BP 122/76 | HR 84 | Ht 73.0 in | Wt 219.2 lb

## 2018-08-25 DIAGNOSIS — K219 Gastro-esophageal reflux disease without esophagitis: Secondary | ICD-10-CM | POA: Diagnosis not present

## 2018-08-25 DIAGNOSIS — J301 Allergic rhinitis due to pollen: Secondary | ICD-10-CM

## 2018-08-25 DIAGNOSIS — J4540 Moderate persistent asthma, uncomplicated: Secondary | ICD-10-CM

## 2018-08-25 MED ORDER — BECLOMETHASONE DIPROP HFA 80 MCG/ACT IN AERB: 2.0000 | INHALATION_SPRAY | Freq: Two times a day (BID) | RESPIRATORY_TRACT | 5 refills | Status: DC

## 2018-08-25 NOTE — Progress Notes (Signed)
Subjective:    Patient ID: Phillip [name], male    DOB: May 12, 1961, 57 y.o.   MRN: [id number]  Synopsis: Former patient of Dr. Gwenette Greet who has allergic rhinitis and asthma. Simple spirometry testing in 2012 was normal.  HPI Chief Complaint  Patient presents with  . Follow-up    Hasn't been seen in about 2 years,  Ran out of Symbicort and developed cough,  Called and we refilled med and now he has improved with symbicort restart.   Phillip [name] says that he was taking Qvar up until about 2 to 3 months ago and then he ran out.  He did not have any problems with his breathing so he did not bother to refill it.  However, in August he started having more chest tightness and shortness of breath and one night at 2:00 in the morning he woke up with jaw pain, shoulder pain, and severe chest pain.  He ended up going to the emergency room where he was found to have an aortic aneurysm.  Fortunately they did not need to do any surgery and he was discharged home.  He started having a cough not long after this which is worse in the evenings while lying flat.  He says it will wake him up 2-3 times a night.  He called [place] for a prescription and we resumed Symbicort which has been very effective for him.  He says that the cough is now gone.  However, he still has heartburn from time to time.  He notes that his lifestyle has not been the healthiest recently with work.  Specifically he says that when he was traveling quite a bit he had a lot of dietary indiscretion and had more heartburn.  He is intentionally changed jobs recently to try to avoid that.   Past Medical History:  Diagnosis Date  . Allergy   . Asthma    Followed by Pulmonology  . Chicken pox   . GERD (gastroesophageal reflux disease)       Review of Systems     Objective:   Physical Exam Vitals:   08/25/18 1552  BP: 122/76  Pulse: 84  SpO2: 95%   RA  Gen: well appearing HENT: OP clear, TM's clear, neck supple PULM: CTA B, normal  percussion CV: RRR, no mgr, trace edema GI: BS+, soft, nontender Derm: no cyanosis or rash Psyche: normal mood and affect    Records from Dr. Gwenette Greet reviewed where he was prescribed Advair and Singulair for his asthma and allergic rhinitis  CBC    Component Value Date/Time   WBC 8.6 05/18/2018 0351   RBC 5.17 05/18/2018 0351   HGB 16.5 05/18/2018 0351   HCT 47.2 05/18/2018 0351   PLT 255 05/18/2018 0351   MCV 91.3 05/18/2018 0351   MCH 31.9 05/18/2018 0351   MCHC 35.0 05/18/2018 0351   RDW 12.3 05/18/2018 0351   LYMPHSABS 1.7 05/18/2018 0351   MONOABS 0.6 05/18/2018 0351   EOSABS 0.7 05/18/2018 0351   BASOSABS 0.1 05/18/2018 0351         Assessment & Plan:   No diagnosis found.  Discussion: This has been a stable interval for Phillip [name] since he resume Symbicort.  Has stated previously I am not sure that he needs quite that much though so we will change his prescription to Qvar 2 puff twice a day and will renew his albuterol  Mild persistent asthma: After you finish the Symbicort you have at home start taking Qvar 80  mcg 2 puffs twice a day Continue using albuterol as needed for chest tightness wheezing or shortness of breath I am glad you have already had a flu shot  GERD: Avoid eating within three hours of bedtime Follow the lifestyle modification sheet we gave you  We will see you back in 1 year or sooner if needed    Current Outpatient Medications:  .  albuterol (VENTOLIN HFA) 108 (90 BASE) MCG/ACT inhaler, Inhale 2 puffs into the lungs every 6 (six) hours as needed., Disp: 1 Inhaler, Rfl: 4 .  amLODipine (NORVASC) 10 MG tablet, Take 1 tablet (10 mg total) by mouth daily., Disp: 30 tablet, Rfl: 3 .  budesonide-formoterol (SYMBICORT) 160-4.5 MCG/ACT inhaler, Inhale 2 puffs into the lungs 2 (two) times daily., Disp: 1 Inhaler, Rfl: 0 .  levocetirizine (XYZAL) 5 MG tablet, Take 5 mg by mouth every evening., Disp: , Rfl:  .  OVER THE COUNTER MEDICATION,  Omeprazole-As needed, Disp: , Rfl:

## 2018-08-25 NOTE — Patient Instructions (Signed)
Mild persistent asthma: After you finish the Symbicort you have at home start taking Qvar 80 mcg 2 puffs twice a day Continue using albuterol as needed for chest tightness wheezing or shortness of breath I am glad you have already had a flu shot  GERD: Avoid eating within three hours of bedtime Follow the lifestyle modification sheet we gave you  We will see you back in 1 year or sooner if needed

## 2018-08-29 ENCOUNTER — Encounter: Payer: Self-pay | Admitting: Cardiovascular Disease

## 2018-08-29 NOTE — Progress Notes (Signed)
Chief Complaint  Patient presents with  . New Patient (Initial Visit)    Thoracic aortic aneurysm   History of Present Illness: 57 yo male with history of ascending thoracic aortic aneurysm that was found in June 2019, hyperlipidemia, GERD and asthma here today as a new consult, referred by Dr. Nani Ravens, for the evaluation of thoracic aortic aneurysm.  He was seen in the ED in June 2019 with chest pain. Chest CTA without evidence of PE but his aortic root was noted to be dilated at 5.2 cm. The chest CTA also showed calcification in the left main. Troponin was negative. He has been seen in the Pulmonary office recently for treatment of asthma. He has had no chest pain since discharge from the ED. He thinks it was related to his GERD. His wife is a Marine scientist in our CV[employer]. He is generally very active. He has no exertional chest pain.   Primary Care Physician: [name], DO   Past Medical History:  Diagnosis Date  . Allergy   . Asthma    Followed by Pulmonology  . CAD (coronary artery disease)   . Chicken pox   . GERD (gastroesophageal reflux disease)   . Hyperlipidemia   . Thoracic aortic aneurysm [facility])     Past Surgical History:  Procedure Laterality Date  . DENTAL SURGERY    . LEG SURGERY Left 2007  . MASTECTOMY    . WISDOM TOOTH EXTRACTION      Current Outpatient Medications  Medication Sig Dispense Refill  . albuterol (VENTOLIN HFA) 108 (90 BASE) MCG/ACT inhaler Inhale 2 puffs into the lungs every 6 (six) hours as needed. 1 Inhaler 4  . beclomethasone (QVAR REDIHALER) 80 MCG/ACT inhaler Inhale 2 puffs into the lungs 2 (two) times daily. 1 Inhaler 5  . budesonide-formoterol (SYMBICORT) 160-4.5 MCG/ACT inhaler Inhale 2 puffs into the lungs 2 (two) times daily. 1 Inhaler 0  . levocetirizine (XYZAL) 5 MG tablet Take 5 mg by mouth every evening.    Marland Kitchen OVER THE COUNTER MEDICATION Omeprazole-As needed    . amLODipine (NORVASC) 5 MG tablet Take 1 tablet (5 mg total) by  mouth daily. 30 tablet 11  . aspirin EC 81 MG tablet Take 1 tablet (81 mg total) by mouth daily. 90 tablet 3  . metoprolol succinate (TOPROL XL) 25 MG 24 hr tablet Take 1 tablet (25 mg total) by mouth daily. 30 tablet 11  . rosuvastatin (CRESTOR) 5 MG tablet Take 1 tablet (5 mg total) by mouth daily. 30 tablet 11   No current facility-administered medications for this visit.     Allergies  Allergen Reactions  . Penicillins Rash    Childhood reaction    Social History   Socioeconomic History  . Marital status: Married    Spouse name: Not on file  . Number of children: 2  . Years of education: Not on file  . Highest education level: Not on file  Occupational History  . Occupation: management health care  Social Needs  . Financial resource strain: Not on file  . Food insecurity:    Worry: Not on file    Inability: Not on file  . Transportation needs:    Medical: Not on file    Non-medical: Not on file  Tobacco Use  . Smoking status: Never Smoker  . Smokeless tobacco: Never Used  Substance and Sexual Activity  . Alcohol use: Yes    Alcohol/week: 0.0 standard drinks    Comment: socially  .  Drug use: No  . Sexual activity: Not on file  Lifestyle  . Physical activity:    Days per week: Not on file    Minutes per session: Not on file  . Stress: Not on file  Relationships  . Social connections:    Talks on phone: Not on file    Gets together: Not on file    Attends religious service: Not on file    Active member of club or organization: Not on file    Attends meetings of clubs or organizations: Not on file    Relationship status: Not on file  . Intimate partner violence:    Fear of current or ex partner: Not on file    Emotionally abused: Not on file    Physically abused: Not on file    Forced sexual activity: Not on file  Other Topics Concern  . Not on file  Social History Narrative  . Not on file    Family History  Problem Relation Age of Onset  .  Hypertension Mother        Controlled with diet  . Aortic aneurysm Mother   . Hiatal hernia Father   . Hodgkin's lymphoma Child        Remission  . Emphysema Maternal Uncle   . Colon cancer Neg Hx   . Colon polyps Neg Hx     Review of Systems:  As stated in the HPI and otherwise negative.   BP 120/88   Pulse 76   Ht 6\' 1"  (1.854 m)   Wt 214 lb (97.1 kg)   SpO2 97%   BMI 28.23 kg/m   Physical Examination:  General: Well developed, well nourished, NAD  HEENT: OP clear, mucus membranes moist  SKIN: warm, dry. No rashes. Neuro: No focal deficits  Musculoskeletal: Muscle strength 5/5 all ext  Psychiatric: Mood and affect normal  Neck: No JVD, no carotid bruits, no thyromegaly, no lymphadenopathy.  Lungs:Clear bilaterally, no wheezes, rhonci, crackles Cardiovascular: Regular rate and rhythm. No murmurs, gallops or rubs. Abdomen:Soft. Bowel sounds present. Non-tender.  Extremities: No lower extremity edema. Pulses are 2 + in the bilateral DP/PT.  EKG:  EKG is not ordered today. The ekg ordered today demonstrates   Recent Labs: 05/18/2018: ALT 37; BUN 9; Creatinine, Ser 1.08; Hemoglobin 16.5; Platelets 255; Potassium 3.5; Sodium 140   Lipid Panel    Component Value Date/Time   CHOL 220 (H) 09/01/2017 0820   TRIG 135.0 09/01/2017 0820   HDL 41.30 09/01/2017 0820   CHOLHDL 5 09/01/2017 0820   VLDL 27.0 09/01/2017 0820   LDLCALC 151 (H) 09/01/2017 0820     Wt Readings from Last 3 Encounters:  08/30/18 214 lb (97.1 kg)  08/25/18 219 lb 3.2 oz (99.4 kg)  06/23/18 212 lb 6 oz (96.3 kg)     Other studies Reviewed: Additional studies/ records that were reviewed today include: ED records, CT scan report Review of the above records demonstrates:    Assessment and Plan:   1. Thoracic aortic aneurysm: Chest CTA June 2019 with 5.2 cm dilated aortic root. I will restrict his lifting. Will start Toprol 25 mg po daily. Will lower Norvasc to 5 mg daily given mild LE edema.  Start ASA 81 mg daily given evidence of calcification in the left main. Start Crestor 5 mg daily. Lipids today. Repeat lipdis and LFTs in 12 weeks. Echo today to exclude aortic valve disease/bicuspid AV. Refer to Dr. Cyndia Bent to follow aneurysm. Will repeat chest CTA  in December 2019.   2. CAD: Evidence of left main calcification on non cardiac chest CTA. Will add ASA and statin. He has no chest pain so no ischemic workup at this time.   3. Hyperlipidemia: last LDL 151 in October 2018. Will start low dose statin. Check lipids today and in 12 weeks.   Current medicines are reviewed at length with the patient today.  The patient does not have concerns regarding medicines.  The following changes have been made:  no change  Labs/ tests ordered today include:   Orders Placed This Encounter  Procedures  . CT ANGIO CHEST AORTA W &/OR WO CONTRAST  . Lipid Profile  . Lipid Profile  . Hepatic function panel  . Ambulatory referral to Cardiothoracic Surgery  . ECHOCARDIOGRAM COMPLETE     Disposition:   FU with me in 6 months.    Signed, [name], MD 08/30/2018 9:59 AM    [facility] [address], [place], [place]  [address] Phone: [phone]; Fax: [phone]

## 2018-08-30 ENCOUNTER — Ambulatory Visit: Payer: 59 | Admitting: Cardiovascular Disease

## 2018-08-30 ENCOUNTER — Encounter: Payer: Self-pay | Admitting: Cardiovascular Disease

## 2018-08-30 VITALS — BP 120/88 | HR 76 | Ht 73.0 in | Wt 214.0 lb

## 2018-08-30 DIAGNOSIS — I2510 Atherosclerotic heart disease of native coronary artery without angina pectoris: Secondary | ICD-10-CM | POA: Diagnosis not present

## 2018-08-30 DIAGNOSIS — I712 Thoracic aortic aneurysm, without rupture, unspecified: Secondary | ICD-10-CM

## 2018-08-30 DIAGNOSIS — E7800 Pure hypercholesterolemia, unspecified: Secondary | ICD-10-CM | POA: Diagnosis not present

## 2018-08-30 LAB — LIPID PANEL
CHOL/HDL RATIO: 4.9 ratio (ref 0.0–5.0)
Cholesterol, Total: 222 mg/dL — ABNORMAL HIGH (ref 100–199)
HDL: 45 mg/dL (ref >39)
LDL Calculated: 149 mg/dL — ABNORMAL HIGH (ref 0–99)
TRIGLYCERIDES: 140 mg/dL (ref 0–149)
VLDL CHOLESTEROL CAL: 28 mg/dL (ref 5–40)

## 2018-08-30 MED ORDER — METOPROLOL SUCCINATE ER 25 MG PO TB24: 25.0000 mg | ORAL_TABLET | Freq: Every day | ORAL | 11 refills | Status: DC

## 2018-08-30 MED ORDER — ROSUVASTATIN CALCIUM 5 MG PO TABS: 5.0000 mg | ORAL_TABLET | Freq: Every day | ORAL | 11 refills | Status: DC

## 2018-08-30 MED ORDER — AMLODIPINE BESYLATE 5 MG PO TABS: 5.0000 mg | ORAL_TABLET | Freq: Every day | ORAL | 11 refills | Status: DC

## 2018-08-30 MED ORDER — ASPIRIN EC 81 MG PO TBEC: 81.0000 mg | DELAYED_RELEASE_TABLET | Freq: Every day | ORAL | 3 refills | Status: AC

## 2018-08-30 NOTE — Patient Instructions (Signed)
Medication Instructions:  Your physician has recommended you make the following change in your medication:  Decrease Norvasc to 5 mg by mouth daily. Start Toprol 25 mg by mouth daily. Start Rosuvastatin 5 mg by mouth daily.  Start Aspirin 81 mg by mouth daily.    Labwork: Lab work to be done today--Lipid profile.  Your physician recommends that you return for lab work in: 12 weeks.  -Lipid and liver profiles. This will be fasting.  The lab opens at 7:30 AM   Testing/Procedures: Your physician has requested that you have an echocardiogram. Echocardiography is a painless test that uses sound waves to create images of your heart. It provides your doctor with information about the size and shape of your heart and how well your heart's chambers and valves are working. This procedure takes approximately one hour. There are no restrictions for this procedure.  Non-Cardiac CT Angiography (CTA), is a special type of CT scan that uses a computer to produce multi-dimensional views of major blood vessels throughout the body. In CT angiography, a contrast material is injected through an IV to help visualize the blood vessels To be done in December  Follow-Up: Your physician recommends that you schedule a follow-up appointment in: 6 months. Please call our office in about 3 months to schedule this appointment    Any Other Special Instructions Will Be Listed Below (If Applicable).  You have been referred to Dr. Cyndia Bent at [facility].      If you need a refill on your cardiac medications before your next appointment, please call your pharmacy.

## 2018-09-05 ENCOUNTER — Other Ambulatory Visit: Payer: Self-pay

## 2018-09-05 ENCOUNTER — Ambulatory Visit (HOSPITAL_COMMUNITY): Payer: 59 | Attending: Cardiology

## 2018-09-05 DIAGNOSIS — I712 Thoracic aortic aneurysm, without rupture, unspecified: Secondary | ICD-10-CM

## 2018-09-06 ENCOUNTER — Telehealth: Payer: Self-pay | Admitting: *Deleted

## 2018-09-06 NOTE — Telephone Encounter (Signed)
Left message for pt to call office

## 2018-09-06 NOTE — Telephone Encounter (Signed)
-----   Message from Burnell Blanks, MD sent at 09/05/2018  4:16 PM EDT ----- His heart is normal size. LV systolic function is normal. The aortic valve is normal. The aortic root is dilated but we know this from his CTA. [name]

## 2018-09-14 NOTE — Telephone Encounter (Signed)
I spoke with pt and reviewed echo results with him.  

## 2018-10-04 ENCOUNTER — Encounter: Payer: Self-pay | Admitting: Surgery

## 2018-10-04 ENCOUNTER — Other Ambulatory Visit: Payer: Self-pay

## 2018-10-04 ENCOUNTER — Institutional Professional Consult (permissible substitution): Payer: 59 | Admitting: Surgery

## 2018-10-04 VITALS — BP 140/90 | HR 78 | Resp 18 | Ht 73.0 in | Wt 217.4 lb

## 2018-10-04 DIAGNOSIS — I712 Thoracic aortic aneurysm, without rupture, unspecified: Secondary | ICD-10-CM

## 2018-10-04 NOTE — Progress Notes (Signed)
Cardiothoracic Surgery Consultation   PCP is Wendling, Crosby Oyster, DO Referring Provider is Burnell Blanks*  Chief Complaint  Patient presents with  . Thoracic Aortic Aneurysm    new patient consultation, TAA CTA Chest 05/18/18    HPI:  The patient is a 57 year old gentleman with history of hyperlipidemia, GERD, and asthma who was seen in the emergency department in June 2019 with chest pain.  A CTA of the chest showed no evidence of pulmonary embolism but did show aortic root dilatation with a diameter at the sinus level measured at 5.2 cm.  The tubular portion of the aorta was fairly normal for his body size at around 3.5 cm.  There is some atherosclerotic calcification seen along the left main coronary artery.  Granulomatous type calcifications of along the left hilar lymph nodes.  He was referred by his primary physician to Dr. Angelena Form.  He had an echocardiogram done on 09/05/2018 which showed normal left ventricular systolic function.  The aortic valve was trileaflet with normal function and no stenosis or regurgitation.  The aortic root was measured at 48 mm and the ascending aorta was measured at 39 mm.  He has had no further chest pain.  He is married and lives with his wife Phillip [name] who is a Marine scientist in our CV[employer].  He stays fairly active and has had no exertional chest pain or shortness of breath.  His mother has also been found to have an ascending aortic aneurysm.  Hers is not large enough to recommend surgery.  There is no family history of connective tissue disorder or aortic dissection. Past Medical History:  Diagnosis Date  . Allergy   . Asthma    Followed by Pulmonology  . CAD (coronary artery disease)   . Chicken pox   . GERD (gastroesophageal reflux disease)   . Hyperlipidemia   . Thoracic aortic aneurysm [facility])     Past Surgical History:  Procedure Laterality Date  . DENTAL SURGERY    . LEG SURGERY Left 2007  . MASTECTOMY    . WISDOM TOOTH EXTRACTION        Family History  Problem Relation Age of Onset  . Hypertension Mother        Controlled with diet  . Aortic aneurysm Mother   . Hiatal hernia Father   . Hodgkin's lymphoma Child        Remission  . Emphysema Maternal Uncle   . Colon cancer Neg Hx   . Colon polyps Neg Hx     Social History Social History   Tobacco Use  . Smoking status: Never Smoker  . Smokeless tobacco: Never Used  Substance Use Topics  . Alcohol use: Yes    Alcohol/week: 0.0 standard drinks    Comment: socially  . Drug use: No    Current Outpatient Medications  Medication Sig Dispense Refill  . albuterol (VENTOLIN HFA) 108 (90 BASE) MCG/ACT inhaler Inhale 2 puffs into the lungs every 6 (six) hours as needed. 1 Inhaler 4  . amLODipine (NORVASC) 5 MG tablet Take 1 tablet (5 mg total) by mouth daily. 30 tablet 11  . aspirin EC 81 MG tablet Take 1 tablet (81 mg total) by mouth daily. 90 tablet 3  . beclomethasone (QVAR REDIHALER) 80 MCG/ACT inhaler Inhale 2 puffs into the lungs 2 (two) times daily. 1 Inhaler 5  . budesonide-formoterol (SYMBICORT) 160-4.5 MCG/ACT inhaler Inhale 2 puffs into the lungs 2 (two) times daily. 1 Inhaler 0  . levocetirizine (XYZAL)  5 MG tablet Take 5 mg by mouth every evening.    . metoprolol succinate (TOPROL XL) 25 MG 24 hr tablet Take 1 tablet (25 mg total) by mouth daily. 30 tablet 11  . OVER THE COUNTER MEDICATION Omeprazole-As needed    . rosuvastatin (CRESTOR) 5 MG tablet Take 1 tablet (5 mg total) by mouth daily. 30 tablet 11   No current facility-administered medications for this visit.     Allergies  Allergen Reactions  . Penicillins Rash    Childhood reaction    Review of Systems  Constitutional: Negative.   HENT: Negative.   Eyes: Negative.   Respiratory: Negative.   Cardiovascular: Negative.   Gastrointestinal: Negative.   Endocrine: Negative.   Genitourinary: Negative.   Musculoskeletal: Negative.   Allergic/Immunologic: Negative.   Neurological:  Negative.   Hematological: Negative.   Psychiatric/Behavioral: Negative.     BP 140/90 (BP Location: Left Arm, Patient Position: Sitting, Cuff Size: Normal)   Pulse 78   Resp 18   Ht 6\' 1"  (1.854 m)   Wt 217 lb 6.4 oz (98.6 kg)   SpO2 97% Comment: RA  BMI 28.68 kg/m  Physical Exam  Constitutional: He is oriented to person, place, and time. He appears well-developed and well-nourished. No distress.  HENT:  Head: Normocephalic and atraumatic.  Mouth/Throat: Oropharynx is clear and moist.  Eyes: Pupils are equal, round, and reactive to light. Conjunctivae and EOM are normal.  Neck: Neck supple. No JVD present.  Cardiovascular: Normal rate, regular rhythm and normal heart sounds.  No murmur heard. Pulmonary/Chest: Breath sounds normal.  Musculoskeletal: He exhibits no edema.  Neurological: He is alert and oriented to person, place, and time.  Skin: Skin is warm and dry.  Psychiatric: He has a normal mood and affect.     Diagnostic Tests:  CLINICAL DATA:  Shortness of breath  EXAM: CT ANGIOGRAPHY CHEST WITH CONTRAST  TECHNIQUE: Multidetector CT imaging of the chest was performed using the standard protocol during bolus administration of intravenous contrast. Multiplanar CT image reconstructions and MIPs were obtained to evaluate the vascular anatomy.  CONTRAST:  154mL ISOVUE-370 IOPAMIDOL (ISOVUE-370) INJECTION 76%  COMPARISON:  Chest x-ray earlier today  FINDINGS: Cardiovascular: Satisfactory opacification of the pulmonary arteries to the segmental level. No evidence of pulmonary embolism. Normal heart size. No pericardial effusion. Dilated aortic root at the sinuses of Valsalva, measuring 52 mm in diameter. Atherosclerotic calcifications seen along the left main  Mediastinum/Nodes: Granulomatous type calcifications of left hilar lymph nodes. No adenopathy  Lungs/Pleura: Mild atelectasis. There is no edema, consolidation, effusion, or  pneumothorax.  Upper Abdomen: No acute finding.  Hepatic steatosis  Musculoskeletal: No acute or aggressive finding. Dermal inclusion cyst in the left paramedian back, incidental.  Review of the MIP images confirms the above findings.  IMPRESSION: 1. Negative for pulmonary embolism or other acute finding. 2. Low volumes with mild atelectasis. 3. Coronary atherosclerosis at the left main. 4. Dilated aortic root at the sinuses of Valsalva-52 mm. Recommend cardiology follow-up.   Electronically Signed   By: Monte Fantasia M.D.   On: 05/18/2018 07:51       *[facility]*                        [address]                        [place], [place] [address]  [phone]  ------------------------------------------------------------------- Echocardiography  Patient:    Phillip [name], Phillip [name] MR #:       [id number] Study Date: 09/05/2018 Gender:     M Age:        79 Height:     185.4 cm Weight:     97.1 kg BSA:        2.25 m^2 Pt. Status: Room:   ATTENDING    Sonny Dandy, [place], Outpatient  SONOGRAPHER  [facility], RDCS  cc:  ------------------------------------------------------------------- LV EF: 50% -   55%  ------------------------------------------------------------------- Indications:      (I71.2).  ------------------------------------------------------------------- History:   PMH:  Acquired from the patient and from the patient&'s chart.  Coronary artery disease.  Coronary artery disease.  Known aortic aneurysm, involving the ascending aorta.  Asthma.  Risk factors:  Family history of coronary artery disease. Dyslipidemia.   ------------------------------------------------------------------- Study Conclusions  - Left ventricle: The cavity size was normal. There was mild   concentric hypertrophy. Systolic  function was normal. The   estimated ejection fraction was in the range of 50% to 55%. Wall   motion was normal; there were no regional wall motion   abnormalities. - Aorta: Aortic root dimension: 48 mm (ED). Ascending aortic   diameter: 39 mm (S). - Aortic root: The aortic root was moderately dilated. - Ascending aorta: The ascending aorta was mildly dilated. - Mitral valve: Calcified annulus. There was trivial regurgitation. - Atrial septum: There was increased thickness of the septum,   consistent with lipomatous hypertrophy. - Tricuspid valve: There was trivial regurgitation.  ------------------------------------------------------------------- Study data:  No prior study was available for comparison.  Study status:  Routine.  Procedure:  The patient reported no pain pre or post test. Transthoracic echocardiography for diagnosis, for left ventricular function evaluation, for right ventricular function evaluation, and for evaluation of pulmonary pressures. Image quality was adequate.          Echocardiography.  M-mode, complete 2D, spectral Doppler, and color Doppler.  Birthdate:  Patient birthdate: [birth year].  Age:  Patient is 57 yr old.  Sex:  Gender: male.    BMI: 28.2 kg/m^2.  Blood pressure:     120/88  Patient status:  Outpatient.  Study date:  Study date: 09/05/2018. Study time: 07:40 AM.  Location:   [facility]  -------------------------------------------------------------------  ------------------------------------------------------------------- Left ventricle:  The cavity size was normal. There was mild concentric hypertrophy. Systolic function was normal. The estimated ejection fraction was in the range of 50% to 55%. Wall motion was normal; there were no regional wall motion abnormalities.  ------------------------------------------------------------------- Aortic valve:   Trileaflet; normal thickness leaflets. Mobility was not restricted.  Doppler:   Transvalvular velocity was within the normal range. There was no stenosis. There was no regurgitation.   ------------------------------------------------------------------- Aorta:  Aortic root: The aortic root was moderately dilated. Ascending aorta: The ascending aorta was mildly dilated.  ------------------------------------------------------------------- Mitral valve:   Calcified annulus. Leaflet separation was normal. Mobility was not restricted.  Doppler:  Transvalvular velocity was within the normal range. There was no evidence for stenosis. There was trivial regurgitation.  ------------------------------------------------------------------- Left atrium:  The atrium was normal in size.  ------------------------------------------------------------------- Atrial septum:  There was increased thickness of the septum, consistent with lipomatous hypertrophy.  ------------------------------------------------------------------- Right ventricle:  The cavity size was normal. Wall thickness was normal. Systolic function was normal.  ------------------------------------------------------------------- Pulmonic valve:  Doppler:  Transvalvular velocity was within the normal range. There was no evidence for stenosis.  ------------------------------------------------------------------- Tricuspid valve:   Structurally normal valve.    Doppler: Transvalvular velocity was within the normal range. There was trivial regurgitation.  ------------------------------------------------------------------- Pulmonary artery:   The main pulmonary artery was normal-sized. Systolic pressure was within the normal range.  ------------------------------------------------------------------- Right atrium:  The atrium was normal in size.  ------------------------------------------------------------------- Pericardium:  There was no pericardial  effusion.  ------------------------------------------------------------------- Systemic veins: Inferior vena cava: The vessel was mildly dilated.  ------------------------------------------------------------------- Measurements   Left ventricle                         Value        Reference  LV ID, ED, PLAX chordal        (H)     53.9  mm     43 - 52  LV ID, ES, PLAX chordal        (H)     39.2  mm     23 - 38  LV fx shortening, PLAX chordal (L)     27    %      >=29  LV PW thickness, ED                    11.8  mm     ----------  IVS/LV PW ratio, ED                    1.08         <=1.3  Stroke volume, 2D                      100   ml     ----------  Stroke volume/bsa, 2D                  44    ml/m^2 ----------  LV e&', lateral                         11.5  cm/s   ----------  LV E/e&', lateral                       5.1          ----------  LV e&', medial                          6.42  cm/s   ----------  LV E/e&', medial                        9.14         ----------  LV e&', average                         8.96  cm/s   ----------  LV E/e&', average                       6.55         ----------    Ventricular septum                     Value        Reference  IVS thickness, ED  12.7  mm     ----------    LVOT                                   Value        Reference  LVOT ID, S                             26    mm     ----------  LVOT area                              5.31  cm^2   ----------  LVOT ID                                26    mm     ----------  LVOT peak velocity, S                  80.5  cm/s   ----------  LVOT mean velocity, S                  55.6  cm/s   ----------  LVOT VTI, S                            18.9  cm     ----------  Stroke volume (SV), LVOT DP            100.3 ml     ----------  Stroke index (SV/bsa), LVOT DP         44.5  ml/m^2 ----------    Aorta                                  Value        Reference  Aortic root ID, ED                      48    mm     ----------  Ascending aorta ID, A-P, S             39    mm     ----------    Left atrium                            Value        Reference  LA ID, A-P, ES                         37    mm     ----------  LA ID/bsa, A-P                         1.64  cm/m^2 <=2.2  LA volume, S                           56.6  ml     ----------  LA volume/bsa, S  25.1  ml/m^2 ----------  LA volume, ES, 1-p A4C                 50.3  ml     ----------  LA volume/bsa, ES, 1-p A4C             22.3  ml/m^2 ----------  LA volume, ES, 1-p A2C                 62.6  ml     ----------  LA volume/bsa, ES, 1-p A2C             27.8  ml/m^2 ----------    Mitral valve                           Value        Reference  Mitral E-wave peak velocity            58.7  cm/s   ----------  Mitral A-wave peak velocity            42.2  cm/s   ----------  Mitral deceleration time       (H)     342   ms     150 - 230  Mitral E/A ratio, peak                 1.4          ----------    Right atrium                           Value        Reference  RA ID, S-I, ES, A4C            (H)     52.1  mm     34 - 49  RA area, ES, A4C                       17.9  cm^2   8.3 - 19.5  RA volume, ES, A/L                     50.9  ml     ----------  RA volume/bsa, ES, A/L                 22.6  ml/m^2 ----------    Right ventricle                        Value        Reference  TAPSE                                  19.4  mm     ----------  RV s&', lateral, S                      9.7   cm/s   ----------  Legend: (L)  and  (H)  mark values outside specified reference range.  ------------------------------------------------------------------- Prepared and Electronically Authenticated by  Fransico Him, MD 2019-10-08T11:53:39   Impression:  This 57 year old gentleman has an aortic root aneurysm that was measured at 5.2 cm in diameter by CTA of the chest and 48 mm by echocardiogram.  The tubular  portion of his aorta appears fairly normal for his body size.  I have personally reviewed the CTA and echo images.  There is a fair amount of motion artifact on the CTA study and it is not ideal for evaluating the exact size of the aortic root.  Even at 5.2 cm it is still below the surgical threshold of 5.5 cm since he has a trileaflet normal aortic valve without aortic insufficiency.  I reviewed the CTA and echo images with the patient and his wife.  All their questions have been answered.  I think it would be worthwhile doing a gated cardiac CTA for the next follow-up study to get a more accurate size of the aortic root.  I discussed importance of good blood pressure control and avoiding heavy weight lifting of more than 35 pounds with aortic aneurysms.  Discussed avoiding quinolone antibiotics.  Plan:  He is already been scheduled for a CTA of the chest in December 2019 and we will change that to a gated cardiac CTA.  I told him that I would review the study once it is done and we will discuss it with his wife and him by telephone if desired so that he does not need to return to the office that quickly.  If the aneurysm is stable then I would continue to follow that at 86-month intervals and we will have to decide on the best study to do that based on the results of the gated cardiac CTA.  He and his wife are in agreement with that plan.  I spent 20 minutes performing this consultation and > 50% of this time was spent face to face counseling and coordinating the care of this patient's aortic root aneurysm.   Gaye Pollack, MD [facility] [phone]

## 2018-10-09 ENCOUNTER — Other Ambulatory Visit: Payer: Self-pay | Admitting: *Deleted

## 2018-10-09 DIAGNOSIS — I712 Thoracic aortic aneurysm, without rupture, unspecified: Secondary | ICD-10-CM

## 2018-10-19 MED ORDER — METOPROLOL SUCCINATE ER 25 MG PO TB24: 25.0000 mg | ORAL_TABLET | Freq: Every day | ORAL | 3 refills | Status: DC

## 2018-10-19 MED ORDER — ROSUVASTATIN CALCIUM 5 MG PO TABS: 5.0000 mg | ORAL_TABLET | Freq: Every day | ORAL | 3 refills | Status: DC

## 2018-10-19 NOTE — Telephone Encounter (Signed)
Pt's medications were sent to pt's pharmacy as requested. Confirmation received.  

## 2018-10-23 ENCOUNTER — Other Ambulatory Visit: Payer: Self-pay | Admitting: *Deleted

## 2018-10-23 MED ORDER — AMLODIPINE BESYLATE 5 MG PO TABS: 5.0000 mg | ORAL_TABLET | Freq: Every day | ORAL | 2 refills | Status: DC

## 2018-10-24 MED ORDER — BECLOMETHASONE DIPROP HFA 80 MCG/ACT IN AERB: 2.0000 | INHALATION_SPRAY | Freq: Two times a day (BID) | RESPIRATORY_TRACT | 3 refills | Status: DC

## 2018-11-10 DIAGNOSIS — L720 Epidermal cyst: Secondary | ICD-10-CM | POA: Diagnosis not present

## 2018-11-13 ENCOUNTER — Other Ambulatory Visit: Payer: Self-pay | Admitting: *Deleted

## 2018-11-13 ENCOUNTER — Telehealth (HOSPITAL_COMMUNITY): Payer: Self-pay | Admitting: Emergency Medicine

## 2018-11-13 NOTE — Telephone Encounter (Signed)
RN NAV attempted to make contact with patient to answer any questions, ensure successful procedure tomorrow; left voicemail and call back number [place]

## 2018-11-13 NOTE — Telephone Encounter (Signed)
Pt returned phone call, verbalized understanding of procedure, to take metoprolol prior to study, to refrain from eating 4 hr prior to scan, drinking water 1 hr until scan, denies allergy to contrast dye and does not take metformin, states wife is an employee here and will direct him where to go [facility] [phone]

## 2018-11-14 ENCOUNTER — Other Ambulatory Visit: Payer: 59

## 2018-11-14 ENCOUNTER — Ambulatory Visit (HOSPITAL_COMMUNITY)
Admission: RE | Admit: 2018-11-14 | Discharge: 2018-11-14 | Disposition: A | Discharge status: Home / Self Care | Payer: 59 | Source: Ambulatory Visit | Attending: Surgery | Admitting: Surgery

## 2018-11-14 DIAGNOSIS — I712 Thoracic aortic aneurysm, without rupture, unspecified: Secondary | ICD-10-CM

## 2018-11-14 LAB — LIPID PANEL
CHOL/HDL RATIO: 3.7 ratio (ref 0.0–5.0)
Cholesterol, Total: 171 mg/dL (ref 100–199)
HDL: 46 mg/dL (ref >39)
LDL CALC: 100 mg/dL — AB (ref 0–99)
TRIGLYCERIDES: 124 mg/dL (ref 0–149)
VLDL Cholesterol Cal: 25 mg/dL (ref 5–40)

## 2018-11-14 LAB — HEPATIC FUNCTION PANEL
ALT: 30 IU/L (ref 0–44)
AST: 23 IU/L (ref 0–40)
Albumin: 4.6 g/dL (ref 3.5–5.5)
Alkaline Phosphatase: 59 IU/L (ref 39–117)
BILIRUBIN, DIRECT: 0.11 mg/dL (ref 0.00–0.40)
Bilirubin Total: 0.5 mg/dL (ref 0.0–1.2)
Total Protein: 6.8 g/dL (ref 6.0–8.5)

## 2018-11-14 LAB — POCT I-STAT CREATININE: CREATININE: 1.1 mg/dL (ref 0.61–1.24)

## 2018-11-14 MED ORDER — NITROGLYCERIN 0.4 MG SL SUBL
0.8000 mg | SUBLINGUAL_TABLET | SUBLINGUAL | Status: DC | PRN
  Administered 2018-11-14: 0.8 mg via SUBLINGUAL
  Filled 2018-11-14: qty 25

## 2018-11-14 MED ORDER — IOPAMIDOL (ISOVUE-370) INJECTION 76%
100.0000 mL | Freq: Once | INTRAVENOUS | Status: AC | PRN
  Administered 2018-11-14: 100 mL via INTRAVENOUS

## 2018-11-14 MED ORDER — NITROGLYCERIN 0.4 MG SL SUBL
SUBLINGUAL_TABLET | SUBLINGUAL | Status: AC
  Filled 2018-11-14: qty 2

## 2018-11-14 MED ORDER — METOPROLOL TARTRATE 5 MG/5ML IV SOLN
5.0000 mg | INTRAVENOUS | Status: DC | PRN
  Filled 2018-11-14: qty 5

## 2018-11-15 ENCOUNTER — Other Ambulatory Visit: Payer: 59

## 2018-11-15 ENCOUNTER — Inpatient Hospital Stay: Admission: RE | Admit: 2018-11-15 | Payer: 59 | Source: Ambulatory Visit

## 2018-11-17 ENCOUNTER — Ambulatory Visit (HOSPITAL_COMMUNITY)
Admission: RE | Admit: 2018-11-17 | Discharge: 2018-11-17 | Disposition: A | Discharge status: Home / Self Care | Payer: 59 | Source: Ambulatory Visit | Attending: Cardiology | Admitting: Cardiology

## 2018-11-17 ENCOUNTER — Encounter: Payer: Self-pay | Admitting: Surgery

## 2018-11-17 ENCOUNTER — Other Ambulatory Visit (HOSPITAL_COMMUNITY): Payer: 59

## 2018-11-17 DIAGNOSIS — I712 Thoracic aortic aneurysm, without rupture, unspecified: Secondary | ICD-10-CM

## 2018-11-17 NOTE — Addendum Note (Signed)
Addended by: Dorothy Spark on: 11/17/2018 12:45 PM   Modules accepted: Orders

## 2018-11-24 ENCOUNTER — Telehealth: Payer: Self-pay | Admitting: Cardiovascular Disease

## 2018-11-24 DIAGNOSIS — E7800 Pure hypercholesterolemia, unspecified: Secondary | ICD-10-CM

## 2018-11-24 DIAGNOSIS — I2510 Atherosclerotic heart disease of native coronary artery without angina pectoris: Secondary | ICD-10-CM

## 2018-11-24 NOTE — Telephone Encounter (Signed)
I left a patient today for Phillip [name]. He has moderate to severe disease in the LAD on his coronary CTA. He has been seen by Dr. Cyndia Bent and will need a cardiac cath. I spoke to his wife [name] in [place] yesterday. Based on my limited cath lab time in January, I will put him on the schedule for cardiac catheterization with possible PCI on 12/06/18 at 2:30 pm. I will try to call him back today to confirm this time. He will need pre cath BMET, CBC the week of the cath.   [name] 11/24/2018 9:55 AM

## 2018-11-27 MED ORDER — ROSUVASTATIN CALCIUM 10 MG PO TABS: 10.0000 mg | ORAL_TABLET | Freq: Every day | ORAL | 2 refills | Status: DC

## 2018-11-27 MED ORDER — ROSUVASTATIN CALCIUM 10 MG PO TABS: 10.0000 mg | ORAL_TABLET | Freq: Every day | ORAL | 3 refills | Status: DC

## 2018-11-27 NOTE — Addendum Note (Signed)
Addended by: Derl Barrow on: 11/27/2018 01:13 PM   Modules accepted: Orders

## 2018-11-27 NOTE — Addendum Note (Signed)
Addended by: Derl Barrow on: 11/27/2018 01:14 PM   Modules accepted: Orders

## 2018-11-27 NOTE — Addendum Note (Signed)
Addended by: Derl Barrow on: 11/27/2018 01:11 PM   Modules accepted: Orders

## 2018-11-27 NOTE — Addendum Note (Signed)
Addended by: Thompson Grayer on: 11/27/2018 10:57 AM   Modules accepted: Orders

## 2018-11-27 NOTE — Telephone Encounter (Signed)
I spoke with pt and verbally went over cath instructions with him.  Will send copy to him through my chart.  He will come in for lab work on 12/01/18.   I also reviewed recent lipid and liver results with him.  Will send prescription for increased dose of Crestor to [facility].  He will come in for fasting lipid and liver profiles on March 23,2020.

## 2018-12-01 ENCOUNTER — Other Ambulatory Visit: Payer: 59 | Admitting: *Deleted

## 2018-12-01 DIAGNOSIS — I2510 Atherosclerotic heart disease of native coronary artery without angina pectoris: Secondary | ICD-10-CM

## 2018-12-01 LAB — CBC
HEMATOCRIT: 43.7 % (ref 37.5–51.0)
Hemoglobin: 15.1 g/dL (ref 13.0–17.7)
MCH: 30.4 pg (ref 26.6–33.0)
MCHC: 34.6 g/dL (ref 31.5–35.7)
MCV: 88 fL (ref 79–97)
Platelets: 266 10*3/uL (ref 150–450)
RBC: 4.96 x10E6/uL (ref 4.14–5.80)
RDW: 12.9 % (ref 12.3–15.4)
WBC: 6.8 10*3/uL (ref 3.4–10.8)

## 2018-12-01 LAB — BASIC METABOLIC PANEL
BUN / CREAT RATIO: 12 (ref 9–20)
BUN: 11 mg/dL (ref 6–24)
CO2: 24 mmol/L (ref 20–29)
Calcium: 9.3 mg/dL (ref 8.7–10.2)
Chloride: 99 mmol/L (ref 96–106)
Creatinine, Ser: 0.89 mg/dL (ref 0.76–1.27)
GFR calc non Af Amer: 96 mL/min/{1.73_m2} (ref >59)
GFR, EST AFRICAN AMERICAN: 110 mL/min/{1.73_m2} (ref >59)
GLUCOSE: 110 mg/dL — AB (ref 65–99)
Potassium: 4.5 mmol/L (ref 3.5–5.2)
Sodium: 139 mmol/L (ref 134–144)

## 2018-12-04 ENCOUNTER — Telehealth: Payer: Self-pay | Admitting: *Deleted

## 2018-12-04 NOTE — Telephone Encounter (Signed)
Pt contacted pre-catheterization scheduled at [facility] for: Wednesday December 06, 2018 2:30 PM Verified arrival time and place: [place] Entrance A at: 12:30 PM  No solid food after midnight prior to cath, clear liquids until 5 AM day of procedure. Contrast allergy: no Verified no diabetes medications.  AM meds can be  taken pre-cath with sip of water including: ASA 81 mg  Confirmed patient has responsible person to drive home post procedure and for 24 hours after you arrive home: yes

## 2018-12-06 ENCOUNTER — Other Ambulatory Visit: Payer: Self-pay

## 2018-12-06 ENCOUNTER — Ambulatory Visit (HOSPITAL_COMMUNITY)
Admission: RE | Disposition: A | Discharge status: Home / Self Care | Payer: Self-pay | Source: Home / Self Care | Attending: Cardiovascular Disease

## 2018-12-06 ENCOUNTER — Ambulatory Visit (HOSPITAL_COMMUNITY)
Admission: RE | Admit: 2018-12-06 | Discharge: 2018-12-06 | Disposition: A | Discharge status: Home / Self Care | Payer: 59 | Attending: Cardiovascular Disease | Admitting: Cardiovascular Disease

## 2018-12-06 DIAGNOSIS — Z7982 Long term (current) use of aspirin: Secondary | ICD-10-CM | POA: Insufficient documentation

## 2018-12-06 DIAGNOSIS — E785 Hyperlipidemia, unspecified: Secondary | ICD-10-CM | POA: Insufficient documentation

## 2018-12-06 DIAGNOSIS — I2510 Atherosclerotic heart disease of native coronary artery without angina pectoris: Secondary | ICD-10-CM

## 2018-12-06 DIAGNOSIS — J45909 Unspecified asthma, uncomplicated: Secondary | ICD-10-CM | POA: Diagnosis not present

## 2018-12-06 DIAGNOSIS — Z79899 Other long term (current) drug therapy: Secondary | ICD-10-CM | POA: Insufficient documentation

## 2018-12-06 DIAGNOSIS — I712 Thoracic aortic aneurysm, without rupture: Secondary | ICD-10-CM | POA: Insufficient documentation

## 2018-12-06 DIAGNOSIS — K219 Gastro-esophageal reflux disease without esophagitis: Secondary | ICD-10-CM | POA: Diagnosis not present

## 2018-12-06 DIAGNOSIS — Z880 Allergy status to penicillin: Secondary | ICD-10-CM | POA: Insufficient documentation

## 2018-12-06 DIAGNOSIS — Z8249 Family history of ischemic heart disease and other diseases of the circulatory system: Secondary | ICD-10-CM | POA: Diagnosis not present

## 2018-12-06 HISTORY — PX: LEFT HEART CATH AND CORONARY ANGIOGRAPHY: CATH118249

## 2018-12-06 SURGERY — LEFT HEART CATH AND CORONARY ANGIOGRAPHY
Anesthesia: LOCAL

## 2018-12-06 MED ORDER — SODIUM CHLORIDE 0.9 % IV SOLN: 250.0000 mL | INTRAVENOUS | Status: DC | PRN

## 2018-12-06 MED ORDER — ACETAMINOPHEN 325 MG PO TABS: 650.0000 mg | ORAL_TABLET | ORAL | Status: DC | PRN

## 2018-12-06 MED ORDER — IOHEXOL 350 MG/ML SOLN
INTRAVENOUS | Status: DC | PRN
  Administered 2018-12-06: 65 mL via INTRACARDIAC

## 2018-12-06 MED ORDER — LIDOCAINE HCL (PF) 1 % IJ SOLN
INTRAMUSCULAR | Status: DC | PRN
  Administered 2018-12-06: 2 mL

## 2018-12-06 MED ORDER — FENTANYL CITRATE (PF) 100 MCG/2ML IJ SOLN
INTRAMUSCULAR | Status: DC | PRN
  Administered 2018-12-06 (×2): 25 ug via INTRAVENOUS

## 2018-12-06 MED ORDER — SODIUM CHLORIDE 0.9 % WEIGHT BASED INFUSION: 1.0000 mL/kg/h | INTRAVENOUS | Status: DC

## 2018-12-06 MED ORDER — FENTANYL CITRATE (PF) 100 MCG/2ML IJ SOLN
INTRAMUSCULAR | Status: AC
  Filled 2018-12-06: qty 2

## 2018-12-06 MED ORDER — SODIUM CHLORIDE 0.9% FLUSH: 3.0000 mL | Freq: Two times a day (BID) | INTRAVENOUS | Status: DC

## 2018-12-06 MED ORDER — MIDAZOLAM HCL 2 MG/2ML IJ SOLN
INTRAMUSCULAR | Status: DC | PRN
  Administered 2018-12-06 (×2): 1 mg via INTRAVENOUS

## 2018-12-06 MED ORDER — HEPARIN SODIUM (PORCINE) 1000 UNIT/ML IJ SOLN
INTRAMUSCULAR | Status: AC
  Filled 2018-12-06: qty 1

## 2018-12-06 MED ORDER — VERAPAMIL HCL 2.5 MG/ML IV SOLN
INTRAVENOUS | Status: AC
  Filled 2018-12-06: qty 2

## 2018-12-06 MED ORDER — HEPARIN SODIUM (PORCINE) 1000 UNIT/ML IJ SOLN
INTRAMUSCULAR | Status: DC | PRN
  Administered 2018-12-06: 5000 [IU] via INTRAVENOUS

## 2018-12-06 MED ORDER — ASPIRIN 81 MG PO CHEW: 81.0000 mg | CHEWABLE_TABLET | ORAL | Status: DC

## 2018-12-06 MED ORDER — LIDOCAINE HCL (PF) 1 % IJ SOLN
INTRAMUSCULAR | Status: AC
  Filled 2018-12-06: qty 30

## 2018-12-06 MED ORDER — SODIUM CHLORIDE 0.9% FLUSH: 3.0000 mL | INTRAVENOUS | Status: DC | PRN

## 2018-12-06 MED ORDER — ONDANSETRON HCL 4 MG/2ML IJ SOLN: 4.0000 mg | Freq: Four times a day (QID) | INTRAMUSCULAR | Status: DC | PRN

## 2018-12-06 MED ORDER — SODIUM CHLORIDE 0.9 % IV SOLN: INTRAVENOUS | Status: AC

## 2018-12-06 MED ORDER — SODIUM CHLORIDE 0.9 % WEIGHT BASED INFUSION
3.0000 mL/kg/h | INTRAVENOUS | Status: AC
  Administered 2018-12-06: 3 mL/kg/h via INTRAVENOUS

## 2018-12-06 MED ORDER — VERAPAMIL HCL 2.5 MG/ML IV SOLN
INTRAVENOUS | Status: DC | PRN
  Administered 2018-12-06: 10 mL via INTRA_ARTERIAL

## 2018-12-06 MED ORDER — HEPARIN (PORCINE) IN NACL 1000-0.9 UT/500ML-% IV SOLN
INTRAVENOUS | Status: AC
  Filled 2018-12-06: qty 1000

## 2018-12-06 MED ORDER — MIDAZOLAM HCL 2 MG/2ML IJ SOLN
INTRAMUSCULAR | Status: AC
  Filled 2018-12-06: qty 2

## 2018-12-06 SURGICAL SUPPLY — 10 items
CATH 5FR JL3.5 JR4 ANG PIG MP (CATHETERS) ×1 IMPLANT
DEVICE RAD COMP TR BAND LRG (VASCULAR PRODUCTS) ×1 IMPLANT
GLIDESHEATH SLEND SS 6F .021 (SHEATH) ×1 IMPLANT
GUIDEWIRE INQWIRE 1.5J.035X260 (WIRE) IMPLANT
INQWIRE 1.5J .035X260CM (WIRE) ×2
KIT HEART LEFT (KITS) ×2 IMPLANT
PACK CARDIAC CATHETERIZATION (CUSTOM PROCEDURE TRAY) ×2 IMPLANT
SHEATH PROBE COVER 6X72 (BAG) ×1 IMPLANT
TRANSDUCER W/STOPCOCK (MISCELLANEOUS) ×2 IMPLANT
TUBING CIL FLEX 10 FLL-RA (TUBING) ×2 IMPLANT

## 2018-12-06 NOTE — Interval H&P Note (Signed)
History and Physical Interval Note:  12/06/2018 2:35 PM  Phillip [name]  has presented today for cardiac cath with the diagnosis of CAD  The various methods of treatment have been discussed with the patient and family. After consideration of risks, benefits and other options for treatment, the patient has consented to  Procedure(s): LEFT HEART CATH AND CORONARY ANGIOGRAPHY (N/A) as a surgical intervention .  The patient's history has been reviewed, patient examined, no change in status, stable for surgery.  I have reviewed the patient's chart and labs.  Questions were answered to the patient's satisfaction.    Cath Lab Visit (complete for each Cath Lab visit)  Clinical Evaluation Leading to the Procedure:   ACS: No.  Non-ACS:    Anginal Classification: No Symptoms  Anti-ischemic medical therapy: No Therapy  Non-Invasive Test Results: High risk Gated cardiac CTA with evidence of severe LAD stenosis, flow limiting by CT FFR  Prior CABG: No previous CABG         [name]

## 2018-12-06 NOTE — H&P (Signed)
Cardiology Admission History and Physical:   Patient ID: Phillip [name] MRN: [id number]; DOB: [birth year]   Admission date: 12/06/2018  Primary Care Provider: [name], DO Primary Cardiologist: [name], MD   Chief Complaint:  CAD noted on coronary CTA.    History of Present Illness:   Phillip [name] is a 58 yo male with history of ascending thoracic aortic aneurysm that was found in June 2019, hyperlipidemia, GERD, CAD and asthma here today for cardiac cath with probable PCI. I saw him in the office 08/30/18 for discussion regarding his ascending thoracic aortic aneurysm. He was seen in the ED in June 2019 with chest pain. Chest CTA without evidence of PE but his aortic root was noted to be dilated at 5.2 cm. The chest CTA also showed calcification in the left main. Troponin was negative. He denied chest pain. He was evaluated in our CT surgery office by Dr. Cyndia Bent. Gated cardiac CTA with dilated aortic root with maximum diameter of 5.0 cm at the sinus of Valsalva. The RCA had minor luminal irregularities. The LAD had mild proximal disease. The mid LAD had a moderately severe plaque. By [facility] the stenosis was felt to be flow limiting. The Circumflex has mild to moderate disease.   He is here today for cardiac cath. No c/o chest pain.    Past Medical History:  Diagnosis Date  . Allergy   . Asthma    Followed by Pulmonology  . CAD (coronary artery disease)   . Chicken pox   . GERD (gastroesophageal reflux disease)   . Hyperlipidemia   . Thoracic aortic aneurysm [facility])     Past Surgical History:  Procedure Laterality Date  . DENTAL SURGERY    . LEG SURGERY Left 2007  . MASTECTOMY    . WISDOM TOOTH EXTRACTION       Medications Prior to Admission: Prior to Admission medications   Medication Sig Start Date End Date Taking? Authorizing Provider  albuterol (VENTOLIN HFA) 108 (90 BASE) MCG/ACT inhaler Inhale 2 puffs into the lungs every 6 (six) hours as needed.  03/17/15  Yes Clance, Armando Reichert, MD  amLODipine (NORVASC) 10 MG tablet Take 10 mg by mouth daily.   Yes [provider]  aspirin EC 81 MG tablet Take 1 tablet (81 mg total) by mouth daily. 08/30/18  Yes Burnell Blanks, MD  beclomethasone (QVAR REDIHALER) 80 MCG/ACT inhaler Inhale 2 puffs into the lungs 2 (two) times daily. 10/24/18  Yes Juanito Doom, MD  loratadine (CLARITIN) 10 MG tablet Take 10 mg by mouth daily.   Yes [provider]  metoprolol succinate (TOPROL XL) 25 MG 24 hr tablet Take 1 tablet (25 mg total) by mouth daily. 10/19/18  Yes Burnell Blanks, MD  rosuvastatin (CRESTOR) 10 MG tablet Take 1 tablet (10 mg total) by mouth daily. 11/27/18  Yes Burnell Blanks, MD  amLODipine (NORVASC) 5 MG tablet Take 1 tablet (5 mg total) by mouth daily. Patient not taking: Reported on 12/01/2018 10/23/18   Burnell Blanks, MD     Allergies:    Allergies  Allergen Reactions  . Penicillins Rash    DID THE REACTION INVOLVE: Swelling of the face/tongue/throat, SOB, or low BP? No Sudden or severe rash/hives, skin peeling, or the inside of the mouth or nose? No Did it require medical treatment? No When did it last happen?Childhood allergy If all above answers are "NO", may proceed with cephalosporin use.     Social History:  Social History   Socioeconomic History  . Marital status: Married    Spouse name: Not on file  . Number of children: 2  . Years of education: Not on file  . Highest education level: Not on file  Occupational History  . Occupation: management health care  Social Needs  . Financial resource strain: Not on file  . Food insecurity:    Worry: Not on file    Inability: Not on file  . Transportation needs:    Medical: Not on file    Non-medical: Not on file  Tobacco Use  . Smoking status: Never Smoker  . Smokeless tobacco: Never Used  Substance and Sexual Activity  . Alcohol use: Yes    Alcohol/week: 0.0  standard drinks    Comment: socially  . Drug use: No  . Sexual activity: Not on file  Lifestyle  . Physical activity:    Days per week: Not on file    Minutes per session: Not on file  . Stress: Not on file  Relationships  . Social connections:    Talks on phone: Not on file    Gets together: Not on file    Attends religious service: Not on file    Active member of club or organization: Not on file    Attends meetings of clubs or organizations: Not on file    Relationship status: Not on file  . Intimate partner violence:    Fear of current or ex partner: Not on file    Emotionally abused: Not on file    Physically abused: Not on file    Forced sexual activity: Not on file  Other Topics Concern  . Not on file  Social History Narrative  . Not on file    Family History:   The patient's family history includes Aortic aneurysm in his mother; Emphysema in his maternal uncle; Hiatal hernia in his father; Hodgkin's lymphoma in his child; Hypertension in his mother. There is no history of Colon cancer or Colon polyps.    ROS:  Please see the history of present illness.  All other ROS reviewed and negative.     Physical Exam/Data:   Vitals:   12/06/18 1238  BP: 138/87  Pulse: 74  Temp: 98.1 F (36.7 C)  SpO2: 97%  Weight: 94.3 kg  Height: 6\' 1"  (1.854 m)   No intake or output data in the 24 hours ending 12/06/18 1434 Filed Weights   12/06/18 1238  Weight: 94.3 kg   Body mass index is 27.44 kg/m.  General:  Well nourished, well developed, in no acute distress HEENT: normal Lymph: no adenopathy Neck: no JVD Endocrine:  No thryomegaly Vascular: No carotid bruits; FA pulses 2+ bilaterally without bruits  Cardiac:  normal S1, S2; RRR; no murmur  Lungs:  clear to auscultation bilaterally, no wheezing, rhonchi or rales  Abd: soft, nontender, no hepatomegaly  Ext: no LE edema Musculoskeletal:  No deformities, BUE and BLE strength normal and equal Skin: warm and dry    Neuro:  CNs 2-12 intact, no focal abnormalities noted Psych:  Normal affect   Relevant CV Studies: Gated Cardiac CTA:  FINDINGS: A 120 kV prospective scan was triggered in the descending thoracic aorta at 111 HU's. Axial non-contrast 3 mm slices were carried out through the heart. The data set was analyzed on a dedicated work station and scored using the [place]. Gantry rotation speed was 250 msecs and collimation was .6 mm. No beta blockade and 0.8  mg of sl NTG was given. The 3D data set was reconstructed in 5% intervals of the 67-82 % of the R-R cycle. Diastolic phases were analyzed on a dedicated work station using MPR, MIP and VRT modes. The patient received 80 cc of contrast.  Aorta: Dilated aortic aortic root with maximum diameter at the sinus of Valsalva of 50 mm. No calcifications. No dissection.  Sinotubular Junction: 38 x 35 mm  Ascending Thoracic Aorta: 38 x 36 mm  Descending Thoracic Aorta: 24 x 24 mm  Sinus of Valsalva Measurements:  Non-coronary: 44 mm  Right - coronary: 47 mm  Left - coronary: 48 mm  Aortic Valve:  Trileaflet.  No calcifications.  Coronary Arteries:  Normal coronary origin.  Right dominance.  RCA is a very large dominant artery that gives rise to PDA and PLVB. There are only luminal irregularities  Left main is a large lumen short artery that gives rise to LAD and LCX arteries. Left main has no plaque.  LAD is a medium caliber vessel that gives rise to one diagonal artery. Proximal LAD has mild to moderate mixed plaque with stenosis 25-50%. Mid LAD after the takeoff of the 1. diagonal artery has a severe non-calcified plaque with stenosis > 70%.  D1 is a large branch that has mild calcified plaque with stenosis 25-50%.  LCX is a non-dominant artery that gives rise to one OM1 branch. There is mild non-calcified plaque with stenosis 25-50%.  Other findings:  Normal pulmonary vein drainage into the left  atrium.  Normal let atrial appendage without a thrombus.  IMPRESSION: 1. Coronary calcium score of 102. This was 73 percentile for age and sex matched control.  2. Normal coronary origin with right dominance.  3. Mild CAD in the proximal LAD, 1. diagonal artery and non-dominant LCX artery, severe stenosis in the mid LAD. Additional imaging with CT FFR will be submitted.  4. Stable dilatation of the aortic root at the sinus Valsalva level with maximum diameter of 50 mm. Normal size of the ascending aorta.  5. Mildly dilated pulmonary artery measuring 31 mm.  Laboratory Data:  Chemistry Recent Labs  Lab 12/01/18 0735  NA 139  K 4.5  CL 99  CO2 24  GLUCOSE 110*  BUN 11  CREATININE 0.89  CALCIUM 9.3  GFRNONAA 96  GFRAA 110    No results for input(s): PROT, ALBUMIN, AST, ALT, ALKPHOS, BILITOT in the last 168 hours. Hematology Recent Labs  Lab 12/01/18 0735  WBC 6.8  RBC 4.96  HGB 15.1  HCT 43.7  MCV 88  MCH 30.4  MCHC 34.6  RDW 12.9  PLT 266   Cardiac EnzymesNo results for input(s): TROPONINI in the last 168 hours. No results for input(s): TROPIPOC in the last 168 hours.  BNPNo results for input(s): BNP, PROBNP in the last 168 hours.  DDimer No results for input(s): DDIMER in the last 168 hours.  Radiology/Studies:  No results found.  Assessment and Plan:   1. CAD without angina: Severe LAD stenosis noted on gated cardiac CTA. Cardiac cath today with probable PCI.   For questions or updates, please contact [facility] Please consult [email or web address] for contact info under   Signed, [name], MD  12/06/2018 2:34 PM

## 2018-12-06 NOTE — Discharge Instructions (Signed)
Radial Site Care ° °This sheet gives you information about how to care for yourself after your procedure. Your health care provider may also give you more specific instructions. If you have problems or questions, contact your health care provider. °What can I expect after the procedure? °After the procedure, it is common to have: °· Bruising and tenderness at the catheter insertion area. °Follow these instructions at home: °Medicines °· Take over-the-counter and prescription medicines only as told by your health care provider. °Insertion site care °· Follow instructions from your health care provider about how to take care of your insertion site. Make sure you: °? Wash your hands with soap and water before you change your bandage (dressing). If soap and water are not available, use hand sanitizer. °? Change your dressing as told by your health care provider. °? Leave stitches (sutures), skin glue, or adhesive strips in place. These skin closures may need to stay in place for 2 weeks or longer. If adhesive strip edges start to loosen and curl up, you may trim the loose edges. Do not remove adhesive strips completely unless your health care provider tells you to do that. °· Check your insertion site every day for signs of infection. Check for: °? Redness, swelling, or pain. °? Fluid or blood. °? Pus or a bad smell. °? Warmth. °· Do not take baths, swim, or use a hot tub until your health care provider approves. °· You may shower 24-48 hours after the procedure, or as directed by your health care provider. °? Remove the dressing and gently wash the site with plain soap and water. °? Pat the area dry with a clean towel. °? Do not rub the site. That could cause bleeding. °· Do not apply powder or lotion to the site. °Activity ° °· For 24 hours after the procedure, or as directed by your health care provider: °? Do not flex or bend the affected arm. °? Do not push or pull heavy objects with the affected arm. °? Do not  drive yourself home from the hospital or clinic. You may drive 24 hours after the procedure unless your health care provider tells you not to. °? Do not operate machinery or power tools. °· Do not lift anything that is heavier than 10 lb (4.5 kg), or the limit that you are told, until your health care provider says that it is safe. °· Ask your health care provider when it is okay to: °? Return to work or school. °? Resume usual physical activities or sports. °? Resume sexual activity. °General instructions °· If the catheter site starts to bleed, raise your arm and put firm pressure on the site. If the bleeding does not stop, get help right away. This is a medical emergency. °· If you went home on the same day as your procedure, a responsible adult should be with you for the first 24 hours after you arrive home. °· Keep all follow-up visits as told by your health care provider. This is important. °Contact a health care provider if: °· You have a fever. °· You have redness, swelling, or yellow drainage around your insertion site. °Get help right away if: °· You have unusual pain at the radial site. °· The catheter insertion area swells very fast. °· The insertion area is bleeding, and the bleeding does not stop when you hold steady pressure on the area. °· Your arm or hand becomes pale, cool, tingly, or numb. °These symptoms may represent a serious problem   that is an emergency. Do not wait to see if the symptoms will go away. Get medical help right away. Call your local emergency services (911 in the [place] Do not drive yourself to the hospital. °Summary °· After the procedure, it is common to have bruising and tenderness at the site. °· Follow instructions from your health care provider about how to take care of your radial site wound. Check the wound every day for signs of infection. °· Do not lift anything that is heavier than 10 lb (4.5 kg), or the limit that you are told, until your health care provider says  that it is safe. °This information is not intended to replace advice given to you by your health care provider. Make sure you discuss any questions you have with your health care provider. °Document Released: 12/18/2010 Document Revised: 12/21/2017 Document Reviewed: 12/21/2017 °Elsevier Interactive Patient Education © 2019 Elsevier Inc. ° °

## 2018-12-07 ENCOUNTER — Encounter (HOSPITAL_COMMUNITY): Payer: Self-pay | Admitting: Cardiovascular Disease

## 2018-12-07 MED FILL — Heparin Sod (Porcine)-NaCl IV Soln 1000 Unit/500ML-0.9%: INTRAVENOUS | Qty: 1000 | Status: AC

## 2019-01-14 ENCOUNTER — Telehealth: Payer: 59 | Admitting: Family

## 2019-01-14 DIAGNOSIS — R6889 Other general symptoms and signs: Secondary | ICD-10-CM

## 2019-01-14 MED ORDER — OSELTAMIVIR PHOSPHATE 75 MG PO CAPS: 75.0000 mg | ORAL_CAPSULE | Freq: Two times a day (BID) | ORAL | 0 refills | Status: DC

## 2019-01-14 NOTE — Progress Notes (Signed)
E visit for Flu like symptoms   We are sorry that you are not feeling well.  Here is how we plan to help! Based on what you have shared with me it looks like you may have a respiratory virus that may be influenza.  Influenza or "the flu" is   an infection caused by a respiratory virus. The flu virus is highly contagious and persons who did not receive their yearly flu vaccination may "catch" the flu from close contact.  We have anti-viral medications to treat the viruses that cause this infection. They are not a "cure" and only shorten the course of the infection. These prescriptions are most effective when they are given within the first 2 days of "flu" symptoms. Antiviral medication are indicated if you have a high risk of complications from the flu. You should  also consider an antiviral medication if you are in close contact with someone who is at risk. These medications can help patients avoid complications from the flu  but have side effects that you should know. Possible side effects from Tamiflu or oseltamivir include nausea, vomiting, diarrhea, dizziness, headaches, eye redness, sleep problems or other respiratory symptoms. You should not take Tamiflu if you have an allergy to oseltamivir or any to the ingredients in Tamiflu.  Based upon your symptoms and potential risk factors I have prescribed Oseltamivir (Tamiflu).  It has been sent to your designated pharmacy.  You will take one 75 mg capsule orally twice a day for the next 5 days.    Approximately five minutes were spent documenting and reviewing patient's chart.  If your symptoms worsen or you do not improve you will need to be seen face-to-face to be evaluated.  ANYONE WHO HAS FLU SYMPTOMS SHOULD: . Stay home. The flu is highly contagious and going out or to work exposes others! . Be sure to drink plenty of fluids. Water is fine as well as fruit juices, sodas and electrolyte beverages. You may want to stay away from caffeine or  alcohol. If you are nauseated, try taking small sips of liquids. How do you know if you are getting enough fluid? Your urine should be a pale yellow or almost colorless. . Get rest. . Taking a steamy shower or using a humidifier may help nasal congestion and ease sore throat pain. Using a saline nasal spray works much the same way. . Cough drops, hard candies and sore throat lozenges may ease your cough. . Line up a caregiver. Have someone check on you regularly.   GET HELP RIGHT AWAY IF: . You cannot keep down liquids or your medications. . You become short of breath . Your fell like you are going to pass out or loose consciousness. . Your symptoms persist after you have completed your treatment plan MAKE SURE YOU   Understand these instructions.  Will watch your condition.  Will get help right away if you are not doing well or get worse.  Your e-visit answers were reviewed by a board certified advanced clinical practitioner to complete your personal care plan.  Depending on the condition, your plan could have included both over the counter or prescription medications.  If there is a problem please reply  once you have received a response from your provider.  Your safety is important to [place].  If you have drug allergies check your prescription carefully.    You can use MyChart to ask questions about today's visit, request a non-urgent call back, or ask for a  work or school excuse for 24 hours related to this e-Visit. If it has been greater than 24 hours you will need to follow up with your provider, or enter a new e-Visit to address those concerns.  You will get an e-mail in the next two days asking about your experience.  I hope that your e-visit has been valuable and will speed your recovery. Thank you for using e-visits.

## 2019-02-16 DIAGNOSIS — L308 Other specified dermatitis: Secondary | ICD-10-CM | POA: Diagnosis not present

## 2019-02-19 ENCOUNTER — Other Ambulatory Visit: Payer: 59

## 2019-02-20 ENCOUNTER — Encounter: Payer: Self-pay | Admitting: Gastroenterology

## 2019-03-01 ENCOUNTER — Other Ambulatory Visit: Payer: Self-pay | Admitting: *Deleted

## 2019-03-01 DIAGNOSIS — I712 Thoracic aortic aneurysm, without rupture, unspecified: Secondary | ICD-10-CM

## 2019-03-01 NOTE — Progress Notes (Unsigned)
ct 

## 2019-03-08 ENCOUNTER — Telehealth: Payer: Self-pay

## 2019-03-08 NOTE — Telephone Encounter (Signed)
Called left message to call back 

## 2019-03-08 NOTE — Telephone Encounter (Signed)
Last ov 05/2018- overdue for 1 month f/u- virtual visit?

## 2019-03-12 NOTE — Telephone Encounter (Signed)
Called left detailed message 

## 2019-03-13 NOTE — Telephone Encounter (Signed)
Called left message to call back 

## 2019-03-14 NOTE — Telephone Encounter (Signed)
Called left message to call back 

## 2019-03-16 NOTE — Telephone Encounter (Signed)
Have called multiple days and left messages no return call/unable to speak to the patient. Will close note

## 2019-04-24 ENCOUNTER — Other Ambulatory Visit: Payer: 59

## 2019-04-24 ENCOUNTER — Other Ambulatory Visit: Payer: Self-pay | Admitting: Surgery

## 2019-04-25 ENCOUNTER — Ambulatory Visit
Admission: RE | Admit: 2019-04-25 | Discharge: 2019-04-25 | Disposition: A | Discharge status: Home / Self Care | Payer: 59 | Source: Ambulatory Visit | Attending: Surgery | Admitting: Surgery

## 2019-04-25 ENCOUNTER — Other Ambulatory Visit: Payer: Self-pay

## 2019-04-25 ENCOUNTER — Ambulatory Visit: Payer: 59 | Admitting: Surgery

## 2019-04-25 ENCOUNTER — Other Ambulatory Visit: Payer: 59 | Admitting: *Deleted

## 2019-04-25 DIAGNOSIS — E7800 Pure hypercholesterolemia, unspecified: Secondary | ICD-10-CM

## 2019-04-25 DIAGNOSIS — I712 Thoracic aortic aneurysm, without rupture, unspecified: Secondary | ICD-10-CM

## 2019-04-25 DIAGNOSIS — I2510 Atherosclerotic heart disease of native coronary artery without angina pectoris: Secondary | ICD-10-CM

## 2019-04-25 LAB — HEPATIC FUNCTION PANEL
ALT: 26 IU/L (ref 0–44)
AST: 18 IU/L (ref 0–40)
Albumin: 4.6 g/dL (ref 3.8–4.9)
Alkaline Phosphatase: 59 IU/L (ref 39–117)
Bilirubin Total: 0.3 mg/dL (ref 0.0–1.2)
Bilirubin, Direct: 0.09 mg/dL (ref 0.00–0.40)
Total Protein: 6.8 g/dL (ref 6.0–8.5)

## 2019-04-25 LAB — LIPID PANEL
Chol/HDL Ratio: 3.4 ratio (ref 0.0–5.0)
Cholesterol, Total: 168 mg/dL (ref 100–199)
HDL: 50 mg/dL (ref >39)
LDL Calculated: 101 mg/dL — ABNORMAL HIGH (ref 0–99)
Triglycerides: 85 mg/dL (ref 0–149)
VLDL Cholesterol Cal: 17 mg/dL (ref 5–40)

## 2019-04-25 MED ORDER — IOPAMIDOL (ISOVUE-370) INJECTION 76%
75.0000 mL | Freq: Once | INTRAVENOUS | Status: AC | PRN
  Administered 2019-04-25: 75 mL via INTRAVENOUS

## 2019-04-26 ENCOUNTER — Encounter: Payer: Self-pay | Admitting: Surgery

## 2019-04-26 ENCOUNTER — Ambulatory Visit: Payer: 59 | Admitting: Surgery

## 2019-04-26 VITALS — BP 132/84 | HR 71 | Temp 97.7°F | Resp 20 | Ht 73.0 in | Wt 217.0 lb

## 2019-04-26 DIAGNOSIS — I712 Thoracic aortic aneurysm, without rupture, unspecified: Secondary | ICD-10-CM

## 2019-04-26 NOTE — Progress Notes (Signed)
HPI:  The patient is a 58 year old gentleman who returns today for follow-up of a 5.0 cm aortic root aneurysm.  He had a gated cardiac CTA in December 2019 which showed the aortic root diameter at the sinus of Valsalva level to be 5.0 cm.  He had a CT coronary fractional flow reserve which suggested some significant stenosis in the LAD.  He subsequently underwent cardiac catheterization which showed about 50% mid LAD stenosis and a relatively small vessel.  This was not felt to require any intervention.  The patient continues to do well without chest pain or shortness of breath.  Current Outpatient Medications  Medication Sig Dispense Refill  . albuterol (VENTOLIN HFA) 108 (90 BASE) MCG/ACT inhaler Inhale 2 puffs into the lungs every 6 (six) hours as needed. 1 Inhaler 4  . amLODipine (NORVASC) 5 MG tablet Take 1 tablet (5 mg total) by mouth daily. 90 tablet 2  . aspirin EC 81 MG tablet Take 1 tablet (81 mg total) by mouth daily. 90 tablet 3  . beclomethasone (QVAR REDIHALER) 80 MCG/ACT inhaler Inhale 2 puffs into the lungs 2 (two) times daily. 3 Inhaler 3  . loratadine (CLARITIN) 10 MG tablet Take 10 mg by mouth daily.    . metoprolol succinate (TOPROL XL) 25 MG 24 hr tablet Take 1 tablet (25 mg total) by mouth daily. 90 tablet 3  . oseltamivir (TAMIFLU) 75 MG capsule Take 1 capsule (75 mg total) by mouth 2 (two) times daily. 10 capsule 0  . rosuvastatin (CRESTOR) 10 MG tablet Take 1 tablet (10 mg total) by mouth daily. 90 tablet 2   No current facility-administered medications for this visit.      Physical Exam: BP 132/84   Pulse 71   Temp 97.7 F (36.5 C) (Skin)   Resp 20   Ht 6\' 1"  (1.854 m)   Wt 217 lb (98.4 kg)   SpO2 96% Comment: RA  BMI 28.63 kg/m  He looks well. Cardiac exam shows a regular rate and rhythm with normal heart sounds.  There is no murmur. Lungs are clear.  Diagnostic Tests:  CLINICAL DATA:  Aneurysm of the aortic root.  EXAM: CT ANGIOGRAPHY CHEST  WITH CONTRAST  TECHNIQUE: Multidetector CT imaging of the chest was performed using the standard protocol during bolus administration of intravenous contrast. Multiplanar CT image reconstructions and MIPs were obtained to evaluate the vascular anatomy.  CONTRAST:  34mL ISOVUE-370 IOPAMIDOL (ISOVUE-370) INJECTION 76%  COMPARISON:  11/14/2018, CTA  FINDINGS: Cardiovascular: Measuring in the same orientation as comparison CT from 11/14/2018, there is aneurysm dilatation of the root of the aorta measuring 50 mm at the sinus of Valsalva bowel level (image 55/8). No interval change from 50 mm on comparison exam.  The ascending aorta is normal caliber at 33 mm. Great vessels are normal.  No pericardial effusion.  Coronary artery calcifications noted.  Mediastinum/Nodes: No axillary or supraclavicular lymphadenopathy. No mediastinal hilar adenopathy. No pericardial effusion. Esophagus normal.  Lungs/Pleura: No suspicious pulmonary nodules. Mild bronchial thickening in lower lobes.  Upper Abdomen: Limited view of the liver, kidneys, pancreas are unremarkable. Normal adrenal glands.  Musculoskeletal: No aggressive osseous lesion.  Review of the MIP images confirms the above findings.  IMPRESSION: 1. Stable aneurysmal dilatation of the aortic root measuring 50 mm at the sinus of Valsalva bowel level compared to CT 11/14/2018. 2. Coronary artery calcifications quantified on CT 11/14/2018.   Electronically Signed   By: Suzy Bouchard M.D.   On:  04/25/2019 13:09   Impression:  This 58 year old gentleman has a stable 5.0 cm aortic root aneurysm at the sinus of Valsalva level.  His sinotubular junction is 38 x 35 mm in the ascending aorta 38 x 36 mm.  This is still below the surgical threshold of 5.5 cm.  He had an echocardiogram which showed a normal trileaflet aortic valve without stenosis or insufficiency.  I reviewed the CT images with him and answered his  questions.  I stressed the importance of good blood pressure control and his is in the office today.  I recommended that we repeat his CTA in 1 year.  Plan:  He will return to see me in 1 year with a CTA of the chest.  I spent 10 minutes performing this established patient evaluation and > 50% of this time was spent face to face counseling and coordinating the care of this patient's aortic aneurysm.    Gaye Pollack, MD [facility] [phone]

## 2019-05-01 ENCOUNTER — Telehealth: Payer: Self-pay | Admitting: *Deleted

## 2019-05-01 DIAGNOSIS — E7800 Pure hypercholesterolemia, unspecified: Secondary | ICD-10-CM

## 2019-05-01 MED ORDER — ROSUVASTATIN CALCIUM 20 MG PO TABS: 20.0000 mg | ORAL_TABLET | Freq: Every day | ORAL | 3 refills | Status: DC

## 2019-05-01 NOTE — Telephone Encounter (Signed)
-----   Message from Burnell Blanks, MD sent at 04/25/2019  4:05 PM EDT ----- Goal LDL is 70. His LDL is still 101 on Crestor 10 mg daily. LFTs are ok. I would recommend Crestor 20 mg daily and repeat lipids and LFTs in 12 weeks. [name]

## 2019-05-01 NOTE — Addendum Note (Signed)
Addended by: Thompson Grayer on: 05/01/2019 11:24 AM   Modules accepted: Orders

## 2019-05-01 NOTE — Telephone Encounter (Signed)
I spoke with pt. He read my chart message and increased Rosuvastatin to 20 mg.  He will come in for fasting lab work on August 17,2020 at 7:45.  Will send new prescription to [facility].

## 2019-06-04 ENCOUNTER — Telehealth: Payer: Self-pay | Admitting: *Deleted

## 2019-06-04 NOTE — Telephone Encounter (Signed)
Pt is scheduled to see Dr. Angelena Form for virtual visit on July 20.  Dr. Angelena Form will be in office that day and pt can be seen in office.  I placed call to pt to see if he wanted appointment changed to in office visit.  Left message to call office.

## 2019-06-12 NOTE — Telephone Encounter (Signed)
Left message to call office

## 2019-06-12 NOTE — Telephone Encounter (Signed)
Follow up     Patient is returning call and did agree to in office appt

## 2019-06-15 ENCOUNTER — Telehealth: Payer: Self-pay | Admitting: Cardiovascular Disease

## 2019-06-15 NOTE — Telephone Encounter (Signed)
If the person awaiting results has results back, can be seen otherwise should delay appointment.

## 2019-06-15 NOTE — Telephone Encounter (Signed)
Left message to call office today

## 2019-06-15 NOTE — Telephone Encounter (Signed)
Discussed with Dr.hochrein in Dr. Camillia Herter absence.  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  The employee had a negative rapid result but his MD wanted him to have the nasopharyngeal swab to be 100% positive. Patient is aware and will be at his office visit on Monday.  He thanked me for my call.

## 2019-06-15 NOTE — Telephone Encounter (Signed)
I spoke with pt. Will have management team contact him regarding upcoming appointment.

## 2019-06-15 NOTE — Telephone Encounter (Signed)
Pt is scheduled for 8 AM appointment on July 20 with Dr. Angelena Form. Dr. Angelena Form is not in office this week Will send to Dr. Curt Bears (DOD) to see if appointment should be changed to virtual based on answers to screening questions.

## 2019-06-15 NOTE — Telephone Encounter (Signed)
New Message         COVID-19 Pre-Screening Questions:   In the past 7 to 10 days have you had a cough,  shortness of breath, headache, congestion, fever (100 or greater) body aches, chills, sore throat, or sudden loss of taste or sense of smell? NO  Have you been around anyone with known Covid 19. NO  Have you been around anyone who is awaiting Covid 19 test results in the past 7 to 10 days? Yes but at acceptable social distance, due to his job   Have you been around anyone who has been exposed to Covid 19, or has mentioned symptoms of Covid 19 within the past 7 to 10 days? NO  If you have any concerns/questions about symptoms patients report during screening (either on the phone or at threshold). Contact the provider seeing the patient or DOD for further guidance.  If neither are available contact a member of the leadership team.

## 2019-06-18 ENCOUNTER — Encounter: Payer: Self-pay | Admitting: Cardiovascular Disease

## 2019-06-18 ENCOUNTER — Other Ambulatory Visit: Payer: Self-pay

## 2019-06-18 ENCOUNTER — Ambulatory Visit: Payer: 59 | Admitting: Cardiovascular Disease

## 2019-06-18 VITALS — BP 132/86 | HR 73 | Ht 73.0 in | Wt 216.1 lb

## 2019-06-18 DIAGNOSIS — I712 Thoracic aortic aneurysm, without rupture, unspecified: Secondary | ICD-10-CM

## 2019-06-18 DIAGNOSIS — N522 Drug-induced erectile dysfunction: Secondary | ICD-10-CM

## 2019-06-18 DIAGNOSIS — T148XXA Other injury of unspecified body region, initial encounter: Secondary | ICD-10-CM

## 2019-06-18 DIAGNOSIS — E7800 Pure hypercholesterolemia, unspecified: Secondary | ICD-10-CM

## 2019-06-18 DIAGNOSIS — I2510 Atherosclerotic heart disease of native coronary artery without angina pectoris: Secondary | ICD-10-CM | POA: Diagnosis not present

## 2019-06-18 MED ORDER — SILDENAFIL CITRATE 100 MG PO TABS: 100.0000 mg | ORAL_TABLET | Freq: Every day | ORAL | 0 refills | Status: DC | PRN

## 2019-06-18 NOTE — Progress Notes (Signed)
Chief Complaint  Patient presents with  . Follow-up    Thoracic aortic aneurysm   History of Present Illness: 58 yo male with history of ascending thoracic aortic aneurysm, CAD, GERD, hyperlipidemia and asthma here today for follow up.  He was seen in the ED in June 2019 with chest pain. Chest CTA without evidence of PE but his aortic root was noted to be dilated at 5.2 cm. The chest CTA also showed calcification in the left main. Gated cardiac CT December 2019 with possible flow limiting lesion in the mid LAD. Cardiac cath 12/06/18 with moderate disease in the small mid LAD and mild disease in the RCA and Circumflex. Chest CTA May 2020 showed a 5.0 cm aneurysm of the ascending aorta at the level of the sinuses of valsalva. He is now being followed in our CT surgery office by Dr. Cyndia Bent. Echo October 2019 with normal LV size and function, no valve disease.  His wife is a Marine scientist in our CVICU.   He is here today for follow up. The patient denies any chest pain, dyspnea, palpitations, lower extremity edema, orthopnea, PND, dizziness, near syncope or syncope.   Primary Care Physician: [name], DO  Past Medical History:  Diagnosis Date  . Allergy   . Asthma    Followed by Pulmonology  . CAD (coronary artery disease)   . Chicken pox   . GERD (gastroesophageal reflux disease)   . Hyperlipidemia   . Thoracic aortic aneurysm [facility])     Past Surgical History:  Procedure Laterality Date  . DENTAL SURGERY    . LEFT HEART CATH AND CORONARY ANGIOGRAPHY N/A 12/06/2018   Procedure: LEFT HEART CATH AND CORONARY ANGIOGRAPHY;  Surgeon: Burnell Blanks, MD;  Location: [place] [facility];  Service: Cardiovascular;  Laterality: N/A;  . LEG SURGERY Left 2007  . MASTECTOMY    . WISDOM TOOTH EXTRACTION      Current Outpatient Medications  Medication Sig Dispense Refill  . albuterol (VENTOLIN HFA) 108 (90 BASE) MCG/ACT inhaler Inhale 2 puffs into the lungs every 6 (six) hours as  needed. 1 Inhaler 4  . amLODipine (NORVASC) 5 MG tablet Take 1 tablet (5 mg total) by mouth daily. 90 tablet 2  . aspirin EC 81 MG tablet Take 1 tablet (81 mg total) by mouth daily. 90 tablet 3  . beclomethasone (QVAR REDIHALER) 80 MCG/ACT inhaler Inhale 2 puffs into the lungs 2 (two) times daily. 3 Inhaler 3  . loratadine (CLARITIN) 10 MG tablet Take 10 mg by mouth daily.    . metoprolol succinate (TOPROL XL) 25 MG 24 hr tablet Take 1 tablet (25 mg total) by mouth daily. 90 tablet 3  . oseltamivir (TAMIFLU) 75 MG capsule Take 1 capsule (75 mg total) by mouth 2 (two) times daily. 10 capsule 0  . rosuvastatin (CRESTOR) 20 MG tablet Take 1 tablet (20 mg total) by mouth daily. 90 tablet 3  . sildenafil (VIAGRA) 100 MG tablet Take 1 tablet (100 mg total) by mouth daily as needed for erectile dysfunction. 10 tablet 0   No current facility-administered medications for this visit.     Allergies  Allergen Reactions  . Penicillins Rash    DID THE REACTION INVOLVE: Swelling of the face/tongue/throat, SOB, or low BP? No Sudden or severe rash/hives, skin peeling, or the inside of the mouth or nose? No Did it require medical treatment? No When did it last happen?Childhood allergy If all above answers are "NO", may proceed with  cephalosporin use.     Social History   Socioeconomic History  . Marital status: Married    Spouse name: Not on file  . Number of children: 2  . Years of education: Not on file  . Highest education level: Not on file  Occupational History  . Occupation: management health care  Social Needs  . Financial resource strain: Not on file  . Food insecurity    Worry: Not on file    Inability: Not on file  . Transportation needs    Medical: Not on file    Non-medical: Not on file  Tobacco Use  . Smoking status: Never Smoker  . Smokeless tobacco: Never Used  Substance and Sexual Activity  . Alcohol use: Yes    Alcohol/week: 0.0 standard drinks    Comment: socially  .  Drug use: No  . Sexual activity: Not on file  Lifestyle  . Physical activity    Days per week: Not on file    Minutes per session: Not on file  . Stress: Not on file  Relationships  . Social Herbalist on phone: Not on file    Gets together: Not on file    Attends religious service: Not on file    Active member of club or organization: Not on file    Attends meetings of clubs or organizations: Not on file    Relationship status: Not on file  . Intimate partner violence    Fear of current or ex partner: Not on file    Emotionally abused: Not on file    Physically abused: Not on file    Forced sexual activity: Not on file  Other Topics Concern  . Not on file  Social History Narrative  . Not on file    Family History  Problem Relation Age of Onset  . Hypertension Mother        Controlled with diet  . Aortic aneurysm Mother   . Hiatal hernia Father   . Hodgkin's lymphoma Child        Remission  . Emphysema Maternal Uncle   . Colon cancer Neg Hx   . Colon polyps Neg Hx     Review of Systems:  As stated in the HPI and otherwise negative.   BP 132/86   Pulse 73   Ht 6\' 1"  (1.854 m)   Wt 216 lb 1.9 oz (98 kg)   SpO2 96%   BMI 28.51 kg/m   Physical Examination:  General: Well developed, well nourished, NAD  HEENT: OP clear, mucus membranes moist  SKIN: warm, dry. No rashes. Neuro: No focal deficits  Musculoskeletal: Muscle strength 5/5 all ext  Psychiatric: Mood and affect normal  Neck: No JVD, no carotid bruits, no thyromegaly, no lymphadenopathy.  Lungs:Clear bilaterally, no wheezes, rhonci, crackles Cardiovascular: Regular rate and rhythm. No murmurs, gallops or rubs. Abdomen:Soft. Bowel sounds present. Non-tender.  Extremities: No lower extremity edema. Pulses are 2 + in the bilateral DP/PT.  EKG:  EKG is ordered today. The ekg ordered today demonstrates sinus, rate 73 bpm.   Echo October 2019: - Left ventricle: The cavity size was normal. There  was mild   concentric hypertrophy. Systolic function was normal. The   estimated ejection fraction was in the range of 50% to 55%. Wall   motion was normal; there were no regional wall motion   abnormalities. - Aorta: Aortic root dimension: 48 mm (ED). Ascending aortic   diameter: 39 mm (S). -  Aortic root: The aortic root was moderately dilated. - Ascending aorta: The ascending aorta was mildly dilated. - Mitral valve: Calcified annulus. There was trivial regurgitation. - Atrial septum: There was increased thickness of the septum,   consistent with lipomatous hypertrophy. - Tricuspid valve: There was trivial regurgitation.  Recent Labs: 12/01/2018: BUN 11; Creatinine, Ser 0.89; Hemoglobin 15.1; Platelets 266; Potassium 4.5; Sodium 139 04/25/2019: ALT 26   Lipid Panel    Component Value Date/Time   CHOL 168 04/25/2019 0811   TRIG 85 04/25/2019 0811   HDL 50 04/25/2019 0811   CHOLHDL 3.4 04/25/2019 0811   CHOLHDL 5 09/01/2017 0820   VLDL 27.0 09/01/2017 0820   LDLCALC 101 (H) 04/25/2019 0811     Wt Readings from Last 3 Encounters:  06/18/19 216 lb 1.9 oz (98 kg)  04/26/19 217 lb (98.4 kg)  12/06/18 208 lb (94.3 kg)     Other studies Reviewed: Additional studies/ records that were reviewed today include: ED records, CT scan report Review of the above records demonstrates:    Assessment and Plan:   1. Thoracic aortic aneurysm: Chest CTA May 2020 with 5.0 cm dilated aortic root.  Followed by Dr. Cyndia Bent  2. CAD: No chest pain. Continue ASA, statin and beta blocker.   3. Hyperlipidemia: Last LDL not at goal of 70.  Crestor increased to 20 mg daily in May 2020. Will check lipids and LFTs now.   4. Bruising: Likely due to ASA. Will check CBC.   5. Erectile dysfunction: Will start Viagra.   Current medicines are reviewed at length with the patient today.  The patient does not have concerns regarding medicines.  The following changes have been made:  no change  Labs/ tests  ordered today include:   Orders Placed This Encounter  Procedures  . Lipid panel  . Hepatic function panel  . CBC  . EKG 12-Lead     Disposition:   FU with me in 12 months.    Signed, [name], MD 06/18/2019 10:56 AM    [facility] [address], [place], [place]  [address] Phone: [phone]; Fax: [phone]

## 2019-06-18 NOTE — Patient Instructions (Signed)
Medication Instructions:  1) You may take Sildenafil 100mg - take one half tablet by mouth daily as needed  If you need a refill on your cardiac medications before your next appointment, please call your pharmacy.   Lab work: Your physician recommends that you return for lab work when you are fasting.  If you have labs (blood work) drawn today and your tests are completely normal, you will receive your results only by: Marland Kitchen MyChart Message (if you have MyChart) OR . A paper copy in the mail If you have any lab test that is abnormal or we need to change your treatment, we will call you to review the results.  Testing/Procedures: None  Follow-Up: At [facility], you and your health needs are our priority.  As part of our continuing mission to provide you with exceptional heart care, we have created designated Provider Care Teams.  These Care Teams include your primary Cardiologist (physician) and Advanced Practice Providers (APPs -  Physician Assistants and Nurse Practitioners) who all work together to provide you with the care you need, when you need it. You will need a follow up appointment in 12 months.  Please call our office 2 months in advance to schedule this appointment.  You may see [name], MD or one of the following Advanced Practice Providers on your designated Care Team:   [place], PA-C Melina Copa, PA-C . Ermalinda Barrios, PA-C  Any Other Special Instructions Will Be Listed Below (If Applicable).

## 2019-06-22 ENCOUNTER — Other Ambulatory Visit: Payer: Self-pay | Admitting: Cardiovascular Disease

## 2019-07-02 ENCOUNTER — Other Ambulatory Visit: Payer: 59

## 2019-07-16 ENCOUNTER — Other Ambulatory Visit: Payer: 59

## 2019-10-08 ENCOUNTER — Telehealth: Payer: Self-pay | Admitting: Gastroenterology

## 2019-10-08 NOTE — Telephone Encounter (Signed)
Danis patient, scheduled with PG on 11/11 at 9:00 am, sent to PM DOD Pyrtle:   Spoke to the patient who reports over the past 2-3 years, he has started to experience worsening dysphagia. Stating this dysphagia/food bolus/globus sensation intermittently happened may twice yearly, then quarterly, now monthly. He reports this often happens when eating meat specifically. The patient reports he ate a piece of [employer] yesterday, immediately after eating, he reports he "couldn't swallow." Started to immediately regurgitate any liquid he attempted to swallow to remove the food bolus. He reports he feels better today and is able to swallow food and liquid without regurgitation. He reports pain behind the sternum that he equates with yesterday's food bolus. Patient is in no respiratory distress, the patient spoke in complete sentences with ease, denies SOB or wheezing.

## 2019-10-08 NOTE — Telephone Encounter (Signed)
If he is able to swallow liquids and food then he does not need to return to the emergency room emergently and can wait for his appointment with Tye Savoy, NP on 10/10/2019 His recent symptoms are consistent with what sounds like transient food impaction I would recommend that he begin over-the-counter omeprazole, 20 mg, once daily until his appointment He should eat a very soft diet, avoid tough foods like meats until this appointment If he has recurrent esophageal obstruction where he cannot swallow liquid or solid food and he should go to the ER.

## 2019-10-08 NOTE — Telephone Encounter (Signed)
Patient notified of Dr. Vena Rua recommendations. Patient states he has not had any more problems with food impactions and has continued to chew food completely along with maintaining a soft diet. Patient notified to go to the ED if he has another food impaction. Verbalized understanding.

## 2019-10-10 ENCOUNTER — Encounter: Payer: Self-pay | Admitting: Nurse Practitioner

## 2019-10-10 ENCOUNTER — Other Ambulatory Visit: Payer: Self-pay

## 2019-10-10 ENCOUNTER — Ambulatory Visit (INDEPENDENT_AMBULATORY_CARE_PROVIDER_SITE_OTHER): Payer: 59 | Admitting: Nurse Practitioner

## 2019-10-10 VITALS — BP 118/82 | HR 76 | Temp 97.4°F | Ht 72.0 in | Wt 217.2 lb

## 2019-10-10 DIAGNOSIS — Z1159 Encounter for screening for other viral diseases: Secondary | ICD-10-CM | POA: Diagnosis not present

## 2019-10-10 DIAGNOSIS — R1310 Dysphagia, unspecified: Secondary | ICD-10-CM | POA: Diagnosis not present

## 2019-10-10 NOTE — Patient Instructions (Signed)
If you are age 57 or older, your body mass index should be between 23-30. Your Body mass index is 29.46 kg/m. If this is out of the aforementioned range listed, please consider follow up with your Primary Care Provider.  If you are age 54 or younger, your body mass index should be between 19-25. Your Body mass index is 29.46 kg/m. If this is out of the aformentioned range listed, please consider follow up with your Primary Care Provider.   You have been scheduled for an endoscopy. Please follow written instructions given to you at your visit today. If you use inhalers (even only as needed), please bring them with you on the day of your procedure. Your physician has requested that you go to www.startemmi.com and enter the access code given to you at your visit today. This web site gives a general overview about your procedure. However, you should still follow specific instructions given to you by our office regarding your preparation for the procedure.  Thank you for choosing me and [facility].   Tye Savoy, NP

## 2019-10-10 NOTE — Progress Notes (Signed)
____________________________________________________________  Attending physician addendum:  Thank you for sending this case to me. I have reviewed the entire note, and the outlined plan seems appropriate.  Kathlynn Swofford Danis, MD  ____________________________________________________________  

## 2019-10-10 NOTE — Progress Notes (Signed)
ASSESSMENT / PLAN:   86.  58 yo male with intermittent solid food dysphagia over last five years Now occurring more frequent.  Meat is the main culprit.  He describes what sounds like a temporary food impaction last weekend. Rule out esophageal stricture.  Rule out eosinophilic esophagitis -Further evaluation and treatment patient was scheduled for an EGD . The risks and benefits of EGD were discussed and the patient agrees to proceed.  -Advised patient to eat small bites, chew well with liquids in between bites to avoid food impaction.  2. Hypertension, controlled with medications  3. Aortic root aneurysm, stable on CT angiogram May 2020.   4. History of adenomatous colon polyps March 2017.  Surveillance colonoscopy due March 2022  HPI:        Chief Complaint:   Food getting stuck  Patient is a 58 year old male with PMH significant for hypertension, aortic root aneurysm, history of adenomatous colon polyps.  Roughly 5 years ago he had an episode of steak getting hung up in his esophagus.  He was able to vomit the steak with resolution of symptoms.  He had a recurrent episode a year later and is now having episodes every couple of months.  This past weekend he had a piece of meat get stuck, he was able to cough it up.  It happened a second time over the weekend but that time he wasn't able to get the meat up nor would it pass. He went to bed, woke up during the night coughing up clear secretions.  He was unable to tolerate fluids.  Symptoms lasted on into the next day around 11 AM.  At that time he was able to slowly tolerate fluids.  When the food got stuck in his esophagus on Sunday there was some chest pressure.  Other than that no chest pain.  He has no odynophagia.  Weight stable.  Phillip [name] has no significant GERD symptoms,  Just occasion heartburn relieved with OTC med. He occasionally has indigestion described as epigastric discomfort.  He relates indigestion to  overeating or certain foods like green peppers.  No other GI complaints.  Past Medical History:  Diagnosis Date  . Allergy   . Asthma    Followed by Pulmonology  . CAD (coronary artery disease)   . Chicken pox   . GERD (gastroesophageal reflux disease)   . Hyperlipidemia   . Thoracic aortic aneurysm [facility])      Past Surgical History:  Procedure Laterality Date  . DENTAL SURGERY    . LEFT HEART CATH AND CORONARY ANGIOGRAPHY N/A 12/06/2018   Procedure: LEFT HEART CATH AND CORONARY ANGIOGRAPHY;  Surgeon: Burnell Blanks, MD;  Location: [place] [facility];  Service: Cardiovascular;  Laterality: N/A;  . LEG SURGERY Left 2007  . MASTECTOMY    . WISDOM TOOTH EXTRACTION     Family History  Problem Relation Age of Onset  . Hypertension Mother        Controlled with diet  . Aortic aneurysm Mother   . Hiatal hernia Father   . Hodgkin's lymphoma Child        Remission  . Emphysema Maternal Uncle   . Colon cancer Neg Hx   . Colon polyps Neg Hx    Social History   Tobacco Use  . Smoking status: Never Smoker  . Smokeless tobacco: Never Used  Substance Use Topics  . Alcohol use:  Yes    Alcohol/week: 0.0 standard drinks    Comment: socially  . Drug use: No   Current Outpatient Medications  Medication Sig Dispense Refill  . albuterol (VENTOLIN HFA) 108 (90 BASE) MCG/ACT inhaler Inhale 2 puffs into the lungs every 6 (six) hours as needed. 1 Inhaler 4  . amLODipine (NORVASC) 5 MG tablet Take 1 tablet (5 mg total) by mouth daily. 90 tablet 3  . aspirin EC 81 MG tablet Take 1 tablet (81 mg total) by mouth daily. 90 tablet 3  . beclomethasone (QVAR REDIHALER) 80 MCG/ACT inhaler Inhale 2 puffs into the lungs 2 (two) times daily. 3 Inhaler 3  . loratadine (CLARITIN) 10 MG tablet Take 10 mg by mouth daily.    . metoprolol succinate (TOPROL XL) 25 MG 24 hr tablet Take 1 tablet (25 mg total) by mouth daily. 90 tablet 3  . rosuvastatin (CRESTOR) 20 MG tablet Take 1 tablet (20 mg total)  by mouth daily. 90 tablet 3  . sildenafil (VIAGRA) 100 MG tablet Take 1 tablet (100 mg total) by mouth daily as needed for erectile dysfunction. 10 tablet 0   No current facility-administered medications for this visit.    Allergies  Allergen Reactions  . Penicillins Rash    DID THE REACTION INVOLVE: Swelling of the face/tongue/throat, SOB, or low BP? No Sudden or severe rash/hives, skin peeling, or the inside of the mouth or nose? No Did it require medical treatment? No When did it last happen?Childhood allergy If all above answers are "NO", may proceed with cephalosporin use.     Review of Systems: All systems reviewed and negative except where noted in HPI.    Physical Exam:    Wt Readings from Last 3 Encounters:  10/10/19 217 lb 4 oz (98.5 kg)  06/18/19 216 lb 1.9 oz (98 kg)  04/26/19 217 lb (98.4 kg)    BP 118/82 (BP Location: Left Arm, Patient Position: Sitting, Cuff Size: Normal)   Pulse 76   Temp (!) 97.4 F (36.3 C) (Other (Comment))   Ht 6' (1.829 m)   Wt 217 lb 4 oz (98.5 kg)   BMI 29.46 kg/m  Constitutional:  Pleasant emale in no acute distress. Psychiatric: Normal mood and affect. Behavior is normal. EENT: Pupils normal.  Conjunctivae are normal. No scleral icterus. Neck supple.  Cardiovascular: Normal rate, regular rhythm. No edema Pulmonary/chest: Effort normal and breath sounds normal. No wheezing, rales or rhonchi. Abdominal: Soft, nondistended, nontender. Bowel sounds active throughout. There are no masses palpable. No hepatomegaly. Neurological: Alert and oriented to person place and time. Skin: Skin is warm and dry. No rashes noted.  Tye Savoy, NP  10/10/2019, 9:12 AM

## 2019-10-17 ENCOUNTER — Other Ambulatory Visit: Payer: Self-pay | Admitting: Cardiovascular Disease

## 2019-11-05 ENCOUNTER — Other Ambulatory Visit: Payer: Self-pay | Admitting: Gastroenterology

## 2019-11-05 ENCOUNTER — Encounter: Payer: Self-pay | Admitting: Gastroenterology

## 2019-11-05 ENCOUNTER — Ambulatory Visit (INDEPENDENT_AMBULATORY_CARE_PROVIDER_SITE_OTHER): Payer: 59

## 2019-11-05 DIAGNOSIS — Z1159 Encounter for screening for other viral diseases: Secondary | ICD-10-CM

## 2019-11-05 LAB — SARS CORONAVIRUS 2 (TAT 6-24 HRS): SARS Coronavirus 2: NEGATIVE

## 2019-11-07 ENCOUNTER — Ambulatory Visit (AMBULATORY_SURGERY_CENTER): Payer: 59 | Admitting: Gastroenterology

## 2019-11-07 ENCOUNTER — Encounter: Payer: Self-pay | Admitting: Gastroenterology

## 2019-11-07 ENCOUNTER — Other Ambulatory Visit: Payer: Self-pay

## 2019-11-07 VITALS — BP 128/78 | HR 64 | Temp 97.5°F | Resp 26 | Ht 72.0 in | Wt 217.0 lb

## 2019-11-07 DIAGNOSIS — R1319 Other dysphagia: Secondary | ICD-10-CM

## 2019-11-07 DIAGNOSIS — K200 Eosinophilic esophagitis: Secondary | ICD-10-CM

## 2019-11-07 DIAGNOSIS — R1310 Dysphagia, unspecified: Secondary | ICD-10-CM | POA: Diagnosis not present

## 2019-11-07 DIAGNOSIS — K228 Other specified diseases of esophagus: Secondary | ICD-10-CM | POA: Diagnosis not present

## 2019-11-07 MED ORDER — SODIUM CHLORIDE 0.9 % IV SOLN: 500.0000 mL | Freq: Once | INTRAVENOUS | Status: DC

## 2019-11-07 NOTE — Progress Notes (Signed)
VS-DT Temp-JB 

## 2019-11-07 NOTE — Progress Notes (Signed)
Pt tolerated well. VSS. Awake and to recovery. 

## 2019-11-07 NOTE — Op Note (Signed)
[place] Patient Name: Phillip [name] Procedure Date: 11/07/2019 8:45 AM MRN: [id number] Endoscopist: Mallie Mussel L. Phillip [name] , MD Age: 58 Referring MD:  Date of Birth: [birth year] Gender: Male Account #: [id number] Procedure:                Upper GI endoscopy Indications:              Esophageal dysphagia Medicines:                Monitored Anesthesia Care Procedure:                Pre-Anesthesia Assessment:                           - Prior to the procedure, a History and Physical                            was performed, and patient medications and                            allergies were reviewed. The patient's tolerance of                            previous anesthesia was also reviewed. The risks                            and benefits of the procedure and the sedation                            options and risks were discussed with the patient.                            All questions were answered, and informed consent                            was obtained. Prior Anticoagulants: The patient has                            taken no previous anticoagulant or antiplatelet                            agents. ASA Grade Assessment: III - A patient with                            severe systemic disease. After reviewing the risks                            and benefits, the patient was deemed in                            satisfactory condition to undergo the procedure.                           After obtaining informed consent, the endoscope was  passed under direct vision. Throughout the                            procedure, the patient's blood pressure, pulse, and                            oxygen saturations were monitored continuously. The                            Endoscope was introduced through the mouth, and                            advanced to the second part of duodenum. The upper                            GI endoscopy was accomplished  without difficulty.                            The patient tolerated the procedure well. Scope In: Scope Out: Findings:                 Mucosal changes including longitudinal furrows were                            found in the middle third of the esophagus and in                            the lower third of the esophagus. Esophageal                            findings were graded using the Eosinophilic                            Esophagitis Endoscopic Reference Score (EoE-EREFS)                            as: Edema Grade 0 Normal (distinct vascular                            markings), Rings Grade 0 None (no ridges or rings                            seen), Exudates Grade 0 None (no white lesions                            seen), Furrows Grade 1 Present (vertical lines with                            or without visible depth) and Stricture none (no                            stricture found). Four biopsies were obtained with  cold forceps for histology in the lower third of                            the esophagus, as well as four biopsies in the                            middle third of the esophagus.                           The exam of the esophagus was otherwise normal.                           The stomach was normal.                           The cardia and gastric fundus were normal on                            retroflexion.                           The examined duodenum was normal. Complications:            No immediate complications. Estimated Blood Loss:     Estimated blood loss was minimal. Impression:               - Esophageal mucosal changes suggestive of                            eosinophilic esophagitis.                           - Normal stomach.                           - Normal examined duodenum.                           - Biopsies performed in the lower third of the                            esophagus and in the middle third of the  esophagus. Recommendation:           - Patient has a contact number available for                            emergencies. The signs and symptoms of potential                            delayed complications were discussed with the                            patient. Return to normal activities tomorrow.                            Written discharge instructions were provided to the  patient.                           - Resume previous diet.                           - Continue present medications.                           - Await pathology results. Shaquanna Lycan L. Phillip Carrow, MD 11/07/2019 9:05:35 AM This report has been signed electronically.

## 2019-11-07 NOTE — Patient Instructions (Signed)
THE BIOPSIES TAKEN TODAY HAVE BEEN SENT FOR PATHOLOGY.  THE RESULTS CAN TAKE 2-3 WEEKS TO RECEIVE.    YOU MAY RESUME YOUR PREVIOUS DIET AND MEDICATION SCHEDULE.  Vining YOU FOR ALLOWING [place] TO CARE FOR YOU TODAY!!!  YOU HAD AN ENDOSCOPIC PROCEDURE TODAY AT [facility]:   Refer to the procedure report that was given to you for any specific questions about what was found during the examination.  If the procedure report does not answer your questions, please call your gastroenterologist to clarify.  If you requested that your care partner not be given the details of your procedure findings, then the procedure report has been included in a sealed envelope for you to review at your convenience later.  Please Note:  You might notice some irritation and congestion in your nose or some drainage.  This is from the oxygen used during your procedure.  There is no need for concern and it should clear up in a day or so.  SYMPTOMS TO REPORT IMMEDIATELY:   Following upper endoscopy (EGD)  Vomiting of blood or coffee ground material  New chest pain or pain under the shoulder blades  Painful or persistently difficult swallowing  New shortness of breath  Fever of 100F or higher  Black, tarry-looking stools  For urgent or emergent issues, a gastroenterologist can be reached at any hour by calling [phone].   DIET:  We do recommend a small meal at first, but then you may proceed to your regular diet.  Drink plenty of fluids but you should avoid alcoholic beverages for 24 hours.  ACTIVITY:  You should plan to take it easy for the rest of today and you should NOT DRIVE or use heavy machinery until tomorrow (because of the sedation medicines used during the test).    FOLLOW UP: Our staff will call the number listed on your records 48-72 hours following your procedure to check on you and address any questions or concerns that you may have regarding the information given to you following  your procedure. If we do not reach you, we will leave a message.  We will attempt to reach you two times.  During this call, we will ask if you have developed any symptoms of COVID 19. If you develop any symptoms (ie: fever, flu-like symptoms, shortness of breath, cough etc.) before then, please call [phone].  If you test positive for Covid 19 in the 2 weeks post procedure, please call and report this information to [place].    If any biopsies were taken you will be contacted by phone or by letter within the next 1-3 weeks.  Please call us at [phone] if you have not heard about the biopsies in 3 weeks.    SIGNATURES/CONFIDENTIALITY: You and/or your care partner have signed paperwork which will be entered into your electronic medical record.  These signatures attest to the fact that that the information above on your After Visit Summary has been reviewed and is understood.  Full responsibility of the confidentiality of this discharge information lies with you and/or your care-partner.

## 2019-11-07 NOTE — Progress Notes (Signed)
Called to room to assist during endoscopic procedure.  Patient ID and intended procedure confirmed with present staff. Received instructions for my participation in the procedure from the performing physician.  

## 2019-11-09 ENCOUNTER — Telehealth: Payer: Self-pay | Admitting: *Deleted

## 2019-11-09 ENCOUNTER — Telehealth: Payer: Self-pay

## 2019-11-09 NOTE — Telephone Encounter (Signed)
Message left

## 2019-11-09 NOTE — Telephone Encounter (Signed)
LVM

## 2019-11-13 ENCOUNTER — Other Ambulatory Visit: Payer: Self-pay | Admitting: *Deleted

## 2019-11-13 ENCOUNTER — Telehealth: Payer: Self-pay | Admitting: Gastroenterology

## 2019-11-13 MED ORDER — OMEPRAZOLE 40 MG PO CPDR: DELAYED_RELEASE_CAPSULE | ORAL | 2 refills | Status: DC

## 2019-11-13 NOTE — Telephone Encounter (Signed)
See result note.  

## 2019-12-11 ENCOUNTER — Other Ambulatory Visit: Payer: Self-pay | Admitting: Pulmonary Disease

## 2019-12-26 ENCOUNTER — Other Ambulatory Visit: Payer: Self-pay

## 2019-12-26 ENCOUNTER — Encounter: Payer: Self-pay | Admitting: Gastroenterology

## 2019-12-26 ENCOUNTER — Ambulatory Visit: Payer: 59 | Admitting: Gastroenterology

## 2019-12-26 VITALS — BP 128/84 | HR 76 | Temp 97.6°F | Ht 72.0 in | Wt 223.5 lb

## 2019-12-26 DIAGNOSIS — R1310 Dysphagia, unspecified: Secondary | ICD-10-CM

## 2019-12-26 DIAGNOSIS — R1319 Other dysphagia: Secondary | ICD-10-CM

## 2019-12-26 DIAGNOSIS — K2100 Gastro-esophageal reflux disease with esophagitis, without bleeding: Secondary | ICD-10-CM | POA: Diagnosis not present

## 2019-12-26 NOTE — Progress Notes (Signed)
     [facility] Progress Note  Chief Complaint: Esophageal dysphagia  Subjective  History: Seen in November clinic visit for progressive esophageal dysphagia.  Upper endoscopy early December without stricture or mass.  Distal esophageal appearance with some linear furrows suggestive of eosinophilic esophagitis, but biopsies showed only 20 eos/hpf in distal esophagus, 5 eos in the midesophagus.  This looked more consistent with reflux than true EOE.  Recommended twice daily PPI for about 4 weeks, then decrease to once daily, diet and lifestyle antireflux measures, and office follow-up with me. Mr. Severs is glad to report he is feeling much better.  His dysphagia has resolved, he is being more careful about his intake of meat and bread.  He is trying to control portions and not eat late in the evening, though sometimes difficult with his work schedule.  He denies heartburn or odynophagia.  Has decreased his omeprazole down to 40 mg once daily as advised.  ROS: Cardiovascular:  no chest pain Respiratory: no dyspnea  The patient's Past Medical, Family and Social History were reviewed and are on file in the EMR.  Objective:  Med list reviewed  Current Outpatient Medications:  .  albuterol (VENTOLIN HFA) 108 (90 BASE) MCG/ACT inhaler, Inhale 2 puffs into the lungs every 6 (six) hours as needed., Disp: 1 Inhaler, Rfl: 4 .  amLODipine (NORVASC) 5 MG tablet, Take 1 tablet (5 mg total) by mouth daily., Disp: 90 tablet, Rfl: 3 .  aspirin EC 81 MG tablet, Take 1 tablet (81 mg total) by mouth daily., Disp: 90 tablet, Rfl: 3 .  loratadine (CLARITIN) 10 MG tablet, Take 10 mg by mouth daily., Disp: , Rfl:  .  metoprolol succinate (TOPROL-XL) 25 MG 24 hr tablet, TAKE 1 TABLET DAILY, Disp: 90 tablet, Rfl: 1 .  omeprazole (PRILOSEC) 40 MG capsule, Take 1 tablet twice a day (30-60 minutes before meals) for 30 days then decrease to 1 tablet every morning., Disp: 60 capsule, Rfl: 2 .  QVAR REDIHALER 80  MCG/ACT inhaler, TAKE 2 PUFFS BY MOUTH TWICE A DAY, Disp: 31.8 g, Rfl: 3 .  rosuvastatin (CRESTOR) 20 MG tablet, Take 1 tablet (20 mg total) by mouth daily., Disp: 90 tablet, Rfl: 3 .  sildenafil (VIAGRA) 100 MG tablet, Take 1 tablet (100 mg total) by mouth daily as needed for erectile dysfunction., Disp: 10 tablet, Rfl: 0   No exam-entire 20-minute visit spent in discussion with patient. Recent Labs:  Esophageal biopsy results as noted above   @ASSESSMENTPLANBEGIN @ Assessment: Encounter Diagnoses  Name Primary?  . Esophageal dysphagia Yes  . Gastroesophageal reflux disease with esophagitis without hemorrhage    GERD without classic heartburn or regurgitation, but apparently causing a dysmotility with solid food dysphagia, primarily to meat and bread and some preendoscopy episodes of what sound like transient food impaction.  Since dysphagia has resolved, this does not seem like a more serious motility disorder such as achalasia.  Endoscopic findings were not consistent with that either.  Plan: Focus efforts on diet and lifestyle antireflux measures. Decrease omeprazole to 40 mg 1 tablet every other day for about 2 weeks, then call us with an update.  If doing well, will most likely de-escalate to H2 blocker therapy and have him stay on that chronically to hopefully avoid escalation of symptoms to what they had been months ago.  Phillip [name]

## 2019-12-26 NOTE — Patient Instructions (Signed)
If you are age 58 or older, your body mass index should be between 23-30. Your Body mass index is 30.31 kg/m. If this is out of the aforementioned range listed, please consider follow up with your Primary Care Provider.  If you are age 39 or younger, your body mass index should be between 19-25. Your Body mass index is 30.31 kg/m. If this is out of the aformentioned range listed, please consider follow up with your Primary Care Provider.   Follow up as needed. [phone]  Call with an update in a few weeks.   It was a pleasure to see you today!  Dr. Loletha Carrow

## 2020-01-07 ENCOUNTER — Other Ambulatory Visit: Payer: Self-pay | Admitting: Cardiovascular Disease

## 2020-01-08 MED ORDER — SILDENAFIL CITRATE 100 MG PO TABS: ORAL_TABLET | ORAL | 3 refills | Status: DC

## 2020-01-08 NOTE — Telephone Encounter (Signed)
Hey Dr. Angelena Form, how many tables are you dispensing and how many refills? Please advise

## 2020-01-08 NOTE — Telephone Encounter (Signed)
10 tablets with 3 refills is good with me.

## 2020-01-08 NOTE — Telephone Encounter (Signed)
OK to refill. Phillip [name] 

## 2020-01-08 NOTE — Telephone Encounter (Signed)
Pt's pharmacy is requesting a refill on Sildenafil. Would Dr. Angelena Form like to refill this medication? Please address

## 2020-01-08 NOTE — Telephone Encounter (Signed)
Pt's medication was sent to pt's pharmacy as requested. Confirmation received.  °

## 2020-01-08 NOTE — Addendum Note (Signed)
Addended by: Derl Barrow on: 01/08/2020 04:22 PM   Modules accepted: Orders

## 2020-02-05 ENCOUNTER — Other Ambulatory Visit: Payer: Self-pay | Admitting: Gastroenterology

## 2020-03-06 ENCOUNTER — Other Ambulatory Visit: Payer: Self-pay | Admitting: *Deleted

## 2020-03-06 DIAGNOSIS — I712 Thoracic aortic aneurysm, without rupture, unspecified: Secondary | ICD-10-CM

## 2020-03-21 ENCOUNTER — Other Ambulatory Visit: Payer: Self-pay | Admitting: Gastroenterology

## 2020-03-21 ENCOUNTER — Other Ambulatory Visit: Payer: Self-pay | Admitting: Cardiovascular Disease

## 2020-04-07 ENCOUNTER — Other Ambulatory Visit: Payer: Self-pay | Admitting: Cardiovascular Disease

## 2020-04-23 ENCOUNTER — Encounter: Payer: Self-pay | Admitting: Surgery

## 2020-04-23 ENCOUNTER — Ambulatory Visit
Admission: RE | Admit: 2020-04-23 | Discharge: 2020-04-23 | Disposition: A | Discharge status: Home / Self Care | Payer: 59 | Source: Ambulatory Visit | Attending: Surgery | Admitting: Surgery

## 2020-04-23 ENCOUNTER — Other Ambulatory Visit: Payer: Self-pay

## 2020-04-23 ENCOUNTER — Ambulatory Visit: Payer: 59 | Admitting: Surgery

## 2020-04-23 VITALS — BP 133/86 | HR 71 | Temp 98.2°F | Resp 16 | Ht 72.0 in | Wt 223.6 lb

## 2020-04-23 DIAGNOSIS — I712 Thoracic aortic aneurysm, without rupture, unspecified: Secondary | ICD-10-CM

## 2020-04-23 MED ORDER — IOPAMIDOL (ISOVUE-370) INJECTION 76%
75.0000 mL | Freq: Once | INTRAVENOUS | Status: AC | PRN
  Administered 2020-04-23: 75 mL via INTRAVENOUS

## 2020-04-23 NOTE — Progress Notes (Signed)
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  His last echocardiogram and October 2019 showed a trileaflet aortic valve with no regurgitation or stenosis.  Cardiac catheterization on 12/06/2018 showed 50% mid LAD stenosis and a relatively small vessel that did not reach the apex and has been treated medically.  He continues to do well without chest pain or shortness of breath.  Current Outpatient Medications  Medication Sig Dispense Refill  . albuterol (VENTOLIN HFA) 108 (90 BASE) MCG/ACT inhaler Inhale 2 puffs into the lungs every 6 (six) hours as needed. 1 Inhaler 4  . amLODipine (NORVASC) 5 MG tablet TAKE 1 TABLET DAILY 90 tablet 1  . aspirin EC 81 MG tablet Take 1 tablet (81 mg total) by mouth daily. 90 tablet 3  . loratadine (CLARITIN) 10 MG tablet Take 10 mg by mouth daily.    . metoprolol succinate (TOPROL-XL) 25 MG 24 hr tablet TAKE 1 TABLET DAILY 90 tablet 0  . omeprazole (PRILOSEC) 40 MG capsule TAKE 1 CAP TWICE A DAY 30-60 MINUTES BEFORE MEALS FOR 30 DAYS THEN DECREASE TO 1 CAP EVERY MORNING. 90 capsule 1  . QVAR REDIHALER 80 MCG/ACT inhaler TAKE 2 PUFFS BY MOUTH TWICE A DAY 31.8 g 3  . rosuvastatin (CRESTOR) 20 MG tablet Take 1 tablet (20 mg total) by mouth daily. 90 tablet 1  . sildenafil (VIAGRA) 100 MG tablet TAKE 1 TABLET BY MOUTH DAILY AS NEEDED FOR ERECTILE DYSFUNCTION 10 tablet 3   No current facility-administered medications for this visit.     Physical Exam: BP 133/86 (BP Location: Right Arm, Patient Position: Sitting, Cuff Size: Large)   Pulse 71   Temp 98.2 F (36.8 C)   Resp 16   Ht 6' (1.829 m)   Wt 223 lb 9.6 oz (101.4 kg)   SpO2 94% Comment: RA  BMI 30.33 kg/m  He looks well. Cardiac exam shows a regular rate and rhythm with normal heart sounds.  There is no murmur.  Diagnostic Tests:  CLINICAL DATA:  Thoracic aorta aneurysm follow-up.  EXAM: CT ANGIOGRAPHY CHEST WITH  CONTRAST  TECHNIQUE: Multidetector CT imaging of the chest was performed using the standard protocol during bolus administration of intravenous contrast. Multiplanar CT image reconstructions and MIPs were obtained to evaluate the vascular anatomy.  CONTRAST:  63mL ISOVUE-370 IOPAMIDOL (ISOVUE-370) INJECTION 76%  COMPARISON:  None.  FINDINGS: Cardiovascular: On coronal projection (series 9), there is again demonstrated dilatation of the aortic root at the level of the sinus of Valsalva. Dilated root measures 50 mm (image 74/series 9) unchanged from 50 mm on comparison exam from 04/25/2019 and 11/14/2018.  The ascending aorta remains normal caliber at 35 mm (coronal image 71/series 69).  Ascending aorta measures 38 x 36 mm on axial image just above the sino-tubular junction (image 73/series 4) .  Normal anatomy of the great vessels. No coronary artery aortic calcification.  Mediastinum/Nodes: No axillary or supraclavicular adenopathy. Mediastinal hilar adenopathy.  Lungs/Pleura: No pulmonary nodularity.  Upper Abdomen: Limited view of the liver, kidneys, pancreas are unremarkable. Normal adrenal glands.  Musculoskeletal: No aggressive osseous lesion  Review of the MIP images confirms the above findings.  IMPRESSION: 1. Stable dilatation of the aortic root compared to CT 1 year prior. 2. Normal ascending and transverse aorta.   Electronically Signed   By: Suzy Bouchard M.D.   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  His blood pressure is under good control and he has no risk factors that would increase his risk of aortic dissection including no family history of aortic aneurysm or aortic dissection or connective tissue disorders.  His echocardiogram showed a trileaflet aortic valve with no insufficiency or stenosis.  His aneurysm is still below the surgical threshold of 5.5 cm and I have  recommended continued follow-up.  I reviewed the CTA images with him and answered all of his questions.  Plan:  I will see him back in 1 year with a CTA of the chest.  I spent 15 minutes performing this established patient evaluation and > 50% of this time was spent face to face counseling and coordinating the care of this patient's aortic aneurysm.    Gaye Pollack, MD [facility] [phone]

## 2020-06-25 ENCOUNTER — Other Ambulatory Visit: Payer: Self-pay | Admitting: Cardiovascular Disease

## 2020-08-20 ENCOUNTER — Ambulatory Visit: Payer: 59 | Admitting: Pulmonary Disease

## 2020-09-10 ENCOUNTER — Ambulatory Visit: Payer: 59 | Admitting: Pulmonary Disease

## 2020-09-18 ENCOUNTER — Ambulatory Visit: Payer: 59 | Admitting: Pulmonary Disease

## 2020-09-18 ENCOUNTER — Telehealth: Payer: Self-pay | Admitting: Cardiovascular Disease

## 2020-09-18 NOTE — Telephone Encounter (Signed)
New Message:     Pt wants to know if he needs lab work before his appt on Monday(09-22-20)?

## 2020-09-18 NOTE — Telephone Encounter (Signed)
Patient will come fasting to appointment on Monday.  Lipids last checked May 2020.

## 2020-09-21 NOTE — Progress Notes (Signed)
Chief Complaint  Patient presents with  . Follow-up    CAD   History of Present Illness: 59 yo male with history of ascending thoracic aortic aneurysm, CAD, GERD, hyperlipidemia and asthma here today for follow up.  He was seen in the ED in June 2019 with chest pain. Chest CTA without evidence of PE but his aortic root was noted to be dilated at 5.2 cm. The chest CTA also showed calcification in the left main. Gated cardiac CT December 2019 with possible flow limiting lesion in the mid LAD. Cardiac cath 12/06/18 with moderate disease in the small mid LAD and mild disease in the RCA and Circumflex. Chest CTA May 2021 showed a stable 5.0 cm aneurysm of the ascending aorta at the level of the sinuses of valsalva. He is now being followed in our CT surgery office by Dr. Cyndia Bent. Echo October 2019 with normal LV size and function, no valve disease.  His wife is a Marine scientist in our CVICU.   He is here today for follow up. The patient denies any chest pain, dyspnea, palpitations, lower extremity edema, orthopnea, PND, dizziness, near syncope or syncope.    Primary Care Physician: [name], DO  Past Medical History:  Diagnosis Date  . Allergy   . Arthritis   . Asthma    Followed by Pulmonology  . CAD (coronary artery disease)   . Chicken pox   . GERD (gastroesophageal reflux disease)   . Hyperlipidemia   . Hypertension   . Thoracic aortic aneurysm [facility])     Past Surgical History:  Procedure Laterality Date  . DENTAL SURGERY    . LEFT HEART CATH AND CORONARY ANGIOGRAPHY N/A 12/06/2018   Procedure: LEFT HEART CATH AND CORONARY ANGIOGRAPHY;  Surgeon: Burnell Blanks, MD;  Location: [facility];  Service: Cardiovascular;  Laterality: N/A;  . LEG SURGERY Left 2007  . MASTECTOMY    . WISDOM TOOTH EXTRACTION      Current Outpatient Medications  Medication Sig Dispense Refill  . albuterol (VENTOLIN HFA) 108 (90 BASE) MCG/ACT inhaler Inhale 2 puffs into the lungs every 6  (six) hours as needed. 1 Inhaler 4  . amLODipine (NORVASC) 5 MG tablet TAKE 1 TABLET DAILY 90 tablet 1  . aspirin EC 81 MG tablet Take 1 tablet (81 mg total) by mouth daily. 90 tablet 3  . loratadine (CLARITIN) 10 MG tablet Take 10 mg by mouth daily.    . metoprolol succinate (TOPROL-XL) 25 MG 24 hr tablet Take 1 tablet (25 mg total) by mouth daily. 90 tablet 3  . omeprazole (PRILOSEC) 40 MG capsule Take 40 mg by mouth every other day.    Marland Kitchen QVAR REDIHALER 80 MCG/ACT inhaler TAKE 2 PUFFS BY MOUTH TWICE A DAY 31.8 g 3  . rosuvastatin (CRESTOR) 20 MG tablet Take 1 tablet (20 mg total) by mouth daily. 90 tablet 3  . sildenafil (VIAGRA) 100 MG tablet TAKE 1 TABLET BY MOUTH DAILY AS NEEDED FOR ERECTILE DYSFUNCTION 10 tablet 3   No current facility-administered medications for this visit.    Allergies  Allergen Reactions  . Penicillins Rash    DID THE REACTION INVOLVE: Swelling of the face/tongue/throat, SOB, or low BP? No Sudden or severe rash/hives, skin peeling, or the inside of the mouth or nose? No Did it require medical treatment? No When did it last happen?Childhood allergy If all above answers are "NO", may proceed with cephalosporin use.     Social History  Socioeconomic History  . Marital status: Married    Spouse name: Not on file  . Number of children: 2  . Years of education: Not on file  . Highest education level: Not on file  Occupational History  . Occupation: management health care  Tobacco Use  . Smoking status: Never Smoker  . Smokeless tobacco: Never Used  Vaping Use  . Vaping Use: Never used  Substance and Sexual Activity  . Alcohol use: Yes    Alcohol/week: 0.0 standard drinks    Comment: socially  . Drug use: No  . Sexual activity: Not on file  Other Topics Concern  . Not on file  Social History Narrative  . Not on file   Social Determinants of Health   Financial Resource Strain:   . Difficulty of Paying Living Expenses: Not on file  Food  Insecurity:   . Worried About Charity fundraiser in the Last Year: Not on file  . Ran Out of Food in the Last Year: Not on file  Transportation Needs:   . Lack of Transportation (Medical): Not on file  . Lack of Transportation (Non-Medical): Not on file  Physical Activity:   . Days of Exercise per Week: Not on file  . Minutes of Exercise per Session: Not on file  Stress:   . Feeling of Stress : Not on file  Social Connections:   . Frequency of Communication with Friends and Family: Not on file  . Frequency of Social Gatherings with Friends and Family: Not on file  . Attends Religious Services: Not on file  . Active Member of Clubs or Organizations: Not on file  . Attends Archivist Meetings: Not on file  . Marital Status: Not on file  Intimate Partner Violence:   . Fear of Current or Ex-Partner: Not on file  . Emotionally Abused: Not on file  . Physically Abused: Not on file  . Sexually Abused: Not on file    Family History  Problem Relation Age of Onset  . Hypertension Mother        Controlled with diet  . Aortic aneurysm Mother   . Hiatal hernia Father   . Hodgkin's lymphoma Child        Remission  . Emphysema Maternal Uncle   . Colon cancer Neg Hx   . Colon polyps Neg Hx   . Esophageal cancer Neg Hx   . Rectal cancer Neg Hx   . Stomach cancer Neg Hx     Review of Systems:  As stated in the HPI and otherwise negative.   BP 138/88   Pulse 77   Ht 6' (1.829 m)   Wt 224 lb 9.6 oz (101.9 kg)   SpO2 97%   BMI 30.46 kg/m   Physical Examination:  General: Well developed, well nourished, NAD  HEENT: OP clear, mucus membranes moist  SKIN: warm, dry. No rashes. Neuro: No focal deficits  Musculoskeletal: Muscle strength 5/5 all ext  Psychiatric: Mood and affect normal  Neck: No JVD, no carotid bruits, no thyromegaly, no lymphadenopathy.  Lungs:Clear bilaterally, no wheezes, rhonci, crackles Cardiovascular: Regular rate and rhythm. No murmurs, gallops or  rubs. Abdomen:Soft. Bowel sounds present. Non-tender.  Extremities: No lower extremity edema. Pulses are 2 + in the bilateral DP/PT.  EKG:  EKG is ordered today. The ekg ordered today demonstrates sinus rhythm  Echo October 2019: - Left ventricle: The cavity size was normal. There was mild   concentric hypertrophy. Systolic function was normal.  The   estimated ejection fraction was in the range of 50% to 55%. Wall   motion was normal; there were no regional wall motion   abnormalities. - Aorta: Aortic root dimension: 48 mm (ED). Ascending aortic   diameter: 39 mm (S). - Aortic root: The aortic root was moderately dilated. - Ascending aorta: The ascending aorta was mildly dilated. - Mitral valve: Calcified annulus. There was trivial regurgitation. - Atrial septum: There was increased thickness of the septum,   consistent with lipomatous hypertrophy. - Tricuspid valve: There was trivial regurgitation.  Recent Labs: No results found for requested labs within last 8760 hours.   Lipid Panel    Component Value Date/Time   CHOL 168 04/25/2019 0811   TRIG 85 04/25/2019 0811   HDL 50 04/25/2019 0811   CHOLHDL 3.4 04/25/2019 0811   CHOLHDL 5 09/01/2017 0820   VLDL 27.0 09/01/2017 0820   LDLCALC 101 (H) 04/25/2019 0811     Wt Readings from Last 3 Encounters:  09/22/20 224 lb 9.6 oz (101.9 kg)  04/23/20 223 lb 9.6 oz (101.4 kg)  12/26/19 223 lb 8 oz (101.4 kg)     Other studies Reviewed: Additional studies/ records that were reviewed today include: ED records, CT scan report Review of the above records demonstrates:    Assessment and Plan:   1. Thoracic aortic aneurysm: Chest CTA May 2021 with 5.0 cm dilated aortic root.  Followed by Dr. Cyndia Bent  2. CAD without angina: He has no chest pain. Continue ASA, beta blocker and statin.   3. Hyperlipidemia: Last LDL 101 in May 2020. Crestor was increased in May 2020 to 20 mg daily but follow up lab work was not completed. Continue  statin Check lipids and LFTS today  4. Erectile dysfunction: Continue Viagra.   Current medicines are reviewed at length with the patient today.  The patient does not have concerns regarding medicines.  The following changes have been made:  no change  Labs/ tests ordered today include:   Orders Placed This Encounter  Procedures  . Hepatic function panel  . Lipid panel  . EKG 12-Lead     Disposition:   FU with me in 12 months.    Signed, [name], MD 09/22/2020 10:37 AM    [facility] [place], [place], [place]  [address] Phone: [phone]; Fax: [phone]

## 2020-09-22 ENCOUNTER — Ambulatory Visit: Payer: 59 | Admitting: Cardiovascular Disease

## 2020-09-22 ENCOUNTER — Other Ambulatory Visit: Payer: Self-pay

## 2020-09-22 ENCOUNTER — Encounter: Payer: Self-pay | Admitting: Cardiovascular Disease

## 2020-09-22 VITALS — BP 138/88 | HR 77 | Ht 72.0 in | Wt 224.6 lb

## 2020-09-22 DIAGNOSIS — I712 Thoracic aortic aneurysm, without rupture, unspecified: Secondary | ICD-10-CM

## 2020-09-22 DIAGNOSIS — Z79899 Other long term (current) drug therapy: Secondary | ICD-10-CM

## 2020-09-22 DIAGNOSIS — I2510 Atherosclerotic heart disease of native coronary artery without angina pectoris: Secondary | ICD-10-CM

## 2020-09-22 DIAGNOSIS — E7800 Pure hypercholesterolemia, unspecified: Secondary | ICD-10-CM | POA: Diagnosis not present

## 2020-09-22 LAB — LIPID PANEL
Chol/HDL Ratio: 3.5 ratio (ref 0.0–5.0)
Cholesterol, Total: 171 mg/dL (ref 100–199)
HDL: 49 mg/dL (ref >39)
LDL Chol Calc (NIH): 102 mg/dL — ABNORMAL HIGH (ref 0–99)
Triglycerides: 112 mg/dL (ref 0–149)
VLDL Cholesterol Cal: 20 mg/dL (ref 5–40)

## 2020-09-22 LAB — HEPATIC FUNCTION PANEL
ALT: 34 IU/L (ref 0–44)
AST: 23 IU/L (ref 0–40)
Albumin: 4.6 g/dL (ref 3.8–4.9)
Alkaline Phosphatase: 63 IU/L (ref 44–121)
Bilirubin Total: 0.6 mg/dL (ref 0.0–1.2)
Bilirubin, Direct: 0.15 mg/dL (ref 0.00–0.40)
Total Protein: 7.2 g/dL (ref 6.0–8.5)

## 2020-09-22 MED ORDER — ROSUVASTATIN CALCIUM 20 MG PO TABS
20.0000 mg | ORAL_TABLET | Freq: Every day | ORAL | 3 refills | Status: DC
Start: 2020-09-22 — End: 2020-10-03

## 2020-09-22 MED ORDER — METOPROLOL SUCCINATE ER 25 MG PO TB24
25.0000 mg | ORAL_TABLET | Freq: Every day | ORAL | 3 refills | Status: DC
Start: 2020-09-22 — End: 2021-07-27

## 2020-09-22 NOTE — Patient Instructions (Signed)
Medication Instructions:  The current medical regimen is effective;  continue present plan and medications.  *If you need a refill on your cardiac medications before your next appointment, please call your pharmacy*  Lab Work: Please have blood work today (Lipid and hepatic panel) If you have labs (blood work) drawn today and your tests are completely normal, you will receive your results only by: Marland Kitchen MyChart Message (if you have MyChart) OR . A paper copy in the mail If you have any lab test that is abnormal or we need to change your treatment, we will call you to review the results.  Follow-Up: At [facility], you and your health needs are our priority.  As part of our continuing mission to provide you with exceptional heart care, we have created designated Provider Care Teams.  These Care Teams include your primary Cardiologist (physician) and Advanced Practice Providers (APPs -  Physician Assistants and Nurse Practitioners) who all work together to provide you with the care you need, when you need it.  We recommend signing up for the patient portal called "MyChart".  Sign up information is provided on this After Visit Summary.  MyChart is used to connect with patients for Virtual Visits (Telemedicine).  Patients are able to view lab/test results, encounter notes, upcoming appointments, etc.  Non-urgent messages can be sent to your provider as well.   To learn more about what you can do with MyChart, go to [email or web address].    Your next appointment:   12 month(s)  The format for your next appointment:   In Person  Provider:   [name], MD   Thank you for choosing [facility]!!

## 2020-10-03 ENCOUNTER — Telehealth: Payer: Self-pay | Admitting: *Deleted

## 2020-10-03 DIAGNOSIS — I2510 Atherosclerotic heart disease of native coronary artery without angina pectoris: Secondary | ICD-10-CM

## 2020-10-03 DIAGNOSIS — E7800 Pure hypercholesterolemia, unspecified: Secondary | ICD-10-CM

## 2020-10-03 MED ORDER — ROSUVASTATIN CALCIUM 40 MG PO TABS: 40.0000 mg | ORAL_TABLET | Freq: Every day | ORAL | 3 refills | Status: DC

## 2020-10-03 NOTE — Telephone Encounter (Signed)
Spoke w patient. He will increase Crestor to 40 mg daily. He is going to call back and schedule labs for in 3 months.  Orders in [place].

## 2020-10-03 NOTE — Telephone Encounter (Signed)
-----   Message from Burnell Blanks, MD sent at 09/22/2020  4:48 PM EDT ----- LDL is still not at goal on Crestor 20 mg daily. I would recommend increasing the Crestor to 40 mg daily and then repeat lipids and LFTs in 12 weeks. cdm

## 2020-10-20 ENCOUNTER — Other Ambulatory Visit: Payer: Self-pay

## 2020-10-20 ENCOUNTER — Ambulatory Visit: Payer: 59 | Admitting: Pulmonary Disease

## 2020-10-20 VITALS — BP 120/80 | HR 92 | Temp 98.0°F | Ht 72.0 in | Wt 227.6 lb

## 2020-10-20 DIAGNOSIS — J301 Allergic rhinitis due to pollen: Secondary | ICD-10-CM

## 2020-10-20 DIAGNOSIS — J4540 Moderate persistent asthma, uncomplicated: Secondary | ICD-10-CM | POA: Diagnosis not present

## 2020-10-20 MED ORDER — ALBUTEROL SULFATE HFA 108 (90 BASE) MCG/ACT IN AERS: 2.0000 | INHALATION_SPRAY | Freq: Four times a day (QID) | RESPIRATORY_TRACT | 4 refills | Status: DC | PRN

## 2020-10-20 MED ORDER — QVAR REDIHALER 80 MCG/ACT IN AERB
INHALATION_SPRAY | RESPIRATORY_TRACT | 3 refills | Status: DC
Start: 2020-10-20 — End: 2021-02-26

## 2020-10-20 NOTE — Patient Instructions (Signed)
Continue with Qvar Continue albuterol as needed  Call us as needed  We will follow-up with you in a year

## 2020-10-20 NOTE — Progress Notes (Signed)
   Subjective:    Patient ID: Phillip [name], male    DOB: [birth year], 59 y.o.   MRN: [id number]  Patient with a history of allergic rhinitis asthma  HPI   No significant complaints today Cough and congestion is chronic  Qvar works the best for him, he had tried Advair and Symbicort in the past  Takes Claritin for his allergies  Compliant with Qvar He does have occasional cough and congestion  His health is unchanged from recent  Past Medical History:  Diagnosis Date  . Allergy   . Arthritis   . Asthma    Followed by Pulmonology  . CAD (coronary artery disease)   . Chicken pox   . GERD (gastroesophageal reflux disease)   . Hyperlipidemia   . Hypertension   . Thoracic aortic aneurysm (HCC)       Review of Systems  HENT:       Allergies  Respiratory:       History of asthma       Objective:   Physical Exam Constitutional:      Appearance: He is obese.  HENT:     Head: Atraumatic.     Nose: No congestion or rhinorrhea.  Eyes:     General:        Right eye: No discharge.        Left eye: No discharge.  Cardiovascular:     Rate and Rhythm: Normal rate and regular rhythm.     Heart sounds: No murmur heard.  No friction rub.  Pulmonary:     Effort: No respiratory distress.     Breath sounds: No stridor. No wheezing or rhonchi.  Musculoskeletal:     Cervical back: No rigidity or tenderness.  Neurological:     Mental Status: He is alert.  Psychiatric:        Mood and Affect: Mood normal.    Vitals:   10/20/20 1623  BP: 120/80  Pulse: 92  Temp: 98 F (36.7 C)  TempSrc: Temporal  SpO2: 94%  Weight: 227 lb 9.6 oz (103.2 kg)  Height: 6' (1.829 m)  [facility]    Component Value Date/Time   WBC 6.8 12/01/2018 0735   WBC 8.6 05/18/2018 0351   RBC 4.96 12/01/2018 0735   RBC 5.17 05/18/2018 0351   HGB 15.1 12/01/2018 0735   HCT 43.7 12/01/2018 0735   PLT 266 12/01/2018 0735   MCV 88 12/01/2018 0735   MCH 30.4 12/01/2018 0735   MCH 31.9 05/18/2018  0351   MCHC 34.6 12/01/2018 0735   MCHC 35.0 05/18/2018 0351   RDW 12.9 12/01/2018 0735   LYMPHSABS 1.7 05/18/2018 0351   MONOABS 0.6 05/18/2018 0351   EOSABS 0.7 05/18/2018 0351   BASOSABS 0.1 05/18/2018 0351      Assessment & Plan:   Asthma -Controlled with Qvar  Environmental allergies -Controlled with Claritin  History of GERD -On PPI  Discussion: Symptoms appear to be stable at the present time and no changes need to be made to his inhalers  Encouraged to call with any significant concerns  Follow-up in a year

## 2020-11-05 MED ORDER — ROSUVASTATIN CALCIUM 20 MG PO TABS: 20.0000 mg | ORAL_TABLET | Freq: Every day | ORAL | 3 refills | Status: DC

## 2020-11-06 MED ORDER — ROSUVASTATIN CALCIUM 40 MG PO TABS: 40.0000 mg | ORAL_TABLET | Freq: Every day | ORAL | 3 refills | Status: DC

## 2020-11-06 NOTE — Telephone Encounter (Signed)
Medication list updated. Pt staying on Crestor 40 mg unless symptoms worsen.

## 2020-12-08 ENCOUNTER — Other Ambulatory Visit: Payer: Self-pay | Admitting: Cardiovascular Disease

## 2021-01-19 MED ORDER — AMLODIPINE BESYLATE 5 MG PO TABS: 5.0000 mg | ORAL_TABLET | Freq: Every day | ORAL | 3 refills | Status: DC

## 2021-01-27 ENCOUNTER — Other Ambulatory Visit: Payer: Self-pay

## 2021-01-27 ENCOUNTER — Encounter: Payer: Self-pay | Admitting: Family Medicine

## 2021-01-27 ENCOUNTER — Ambulatory Visit: Payer: 59 | Admitting: Family Medicine

## 2021-01-27 VITALS — BP 118/68 | HR 68 | Temp 97.8°F | Ht 72.0 in | Wt 224.2 lb

## 2021-01-27 DIAGNOSIS — R300 Dysuria: Secondary | ICD-10-CM | POA: Diagnosis not present

## 2021-01-27 LAB — POC URINALSYSI DIPSTICK (AUTOMATED)
Bilirubin, UA: NEGATIVE
Glucose, UA: NEGATIVE
Ketones, UA: NEGATIVE
Nitrite, UA: NEGATIVE
Protein, UA: NEGATIVE
Spec Grav, UA: 1.01 (ref 1.010–1.025)
Urobilinogen, UA: 0.2 E.U./dL
pH, UA: 6 (ref 5.0–8.0)

## 2021-01-27 MED ORDER — SULFAMETHOXAZOLE-TRIMETHOPRIM 800-160 MG PO TABS: 1.0000 | ORAL_TABLET | Freq: Two times a day (BID) | ORAL | 0 refills | Status: DC

## 2021-01-27 NOTE — Patient Instructions (Signed)
Stay hydrated.   Warning signs/symptoms: Uncontrollable nausea/vomiting, fevers, worsening symptoms despite treatment, confusion.  Give us around 2 business days to get culture back to you.  Let us know if you need anything. 

## 2021-01-27 NOTE — Progress Notes (Signed)
Chief Complaint  Patient presents with  . urine odor    Urine urgency  Dysuria     Phillip [name] is a 60 y.o. male here for possible UTI.  Duration: 2 days. Symptoms: Dysuria, urinary frequency and urgency Denies: urinary frequency, hematuria, fever, nausea, vomiting, flank pain, discharge Hx of recurrent UTI? No Denies new sexual partners.  Past Medical History:  Diagnosis Date  . Allergy   . Arthritis   . Asthma    Followed by Pulmonology  . CAD (coronary artery disease)   . Chicken pox   . GERD (gastroesophageal reflux disease)   . Hyperlipidemia   . Hypertension   . Thoracic aortic aneurysm (HCC)      BP 118/68 (BP Location: Left Arm, Patient Position: Sitting, Cuff Size: Normal)   Pulse 68   Temp 97.8 F (36.6 C) (Oral)   Ht 6' (1.829 m)   Wt 224 lb 4 oz (101.7 kg)   SpO2 96%   BMI 30.41 kg/m  General: Awake, alert, appears stated age Heart: RRR Lungs: CTAB, normal respiratory effort, no accessory muscle usage Abd: BS+, soft, NT, ND, no masses or organomegaly MSK: No CVA tenderness, neg Lloyd's sign Psych: Age appropriate judgment and insight  Dysuria - Plan: POCT Urinalysis Dipstick (Automated), Urine Culture, sulfamethoxazole-trimethoprim (BACTRIM DS) 800-160 MG tablet  7 d course, bid. He knows what prostatitis feels like and this is not similar.  Seek immediate care if pt starts to develop fevers, new/worsening symptoms, uncontrollable N/V. F/u for CPE at convenience. The patient voiced understanding and agreement to the plan.  [place], DO 01/27/21 2:56 PM

## 2021-01-30 LAB — URINE CULTURE
MICRO NUMBER:: 11592513
SPECIMEN QUALITY:: ADEQUATE

## 2021-01-30 MED ORDER — CEPHALEXIN 500 MG PO CAPS: 500.0000 mg | ORAL_CAPSULE | Freq: Three times a day (TID) | ORAL | 0 refills | Status: AC

## 2021-01-30 NOTE — Telephone Encounter (Signed)
Patient call he state he has been taking Bactrim for the past 3 day 1/2 and now he is experiencing  Left shoulder pain right wrist and both ankle pain, he would appreciate a follow phone call  Please advice

## 2021-02-03 ENCOUNTER — Other Ambulatory Visit: Payer: Self-pay | Admitting: Family Medicine

## 2021-02-05 NOTE — Telephone Encounter (Signed)
Patient called and appointment made

## 2021-02-06 ENCOUNTER — Ambulatory Visit: Payer: 59 | Admitting: Family Medicine

## 2021-02-06 ENCOUNTER — Encounter: Payer: Self-pay | Admitting: Family Medicine

## 2021-02-06 ENCOUNTER — Other Ambulatory Visit: Payer: Self-pay

## 2021-02-06 VITALS — BP 110/68 | HR 75 | Temp 98.1°F | Ht 72.0 in | Wt 223.0 lb

## 2021-02-06 DIAGNOSIS — R300 Dysuria: Secondary | ICD-10-CM

## 2021-02-06 MED ORDER — NITROFURANTOIN MONOHYD MACRO 100 MG PO CAPS: 100.0000 mg | ORAL_CAPSULE | Freq: Two times a day (BID) | ORAL | 0 refills | Status: AC

## 2021-02-06 NOTE — Patient Instructions (Signed)
Send me a message in 1 week if we aren't turning the corner. I will send in another medicine and get you set up with the urology team.  Let us know if you need anything.

## 2021-02-06 NOTE — Progress Notes (Signed)
Chief Complaint  Patient presents with  . Dysuria    F/u and he is not feeling better from last week.     Subjective: Patient is a 60 y.o. male here for f/u urination issues.  Patient was seen around a week and a half ago and treated for a urinary tract infection.  His urine culture came back positive for over 100,000 colony-forming units of E. coli.  It was pansensitive.  He was started on Bactrim but had a rash and joint pain so he was transitioned to a 7-day course of Keflex which did not particularly help.  He is still having burning.  Denies any perineal pressure.  He does have a history of prostatitis.  He denies any new sexual partners.  Past Medical History:  Diagnosis Date  . Allergy   . Arthritis   . Asthma    Followed by Pulmonology  . CAD (coronary artery disease)   . Chicken pox   . GERD (gastroesophageal reflux disease)   . Hyperlipidemia   . Hypertension   . Thoracic aortic aneurysm (HCC)     Objective: BP 110/68   Pulse 75   Temp 98.1 F (36.7 C) (Oral)   Ht 6' (1.829 m)   Wt 223 lb (101.2 kg)   SpO2 98%   BMI 30.24 kg/m  General: Awake, appears stated age Abdomen: Bowel sounds present, soft, nontender, nondistended, no masses or organomegaly Rectal: Sphincter of good tone, prostate is midline and of normal tone, slightly enlarged, no nodules appreciated, no tenderness to palpation or bogginess MSK: No CVA tenderness bilaterally Heart: RRR Lungs: CTAB, no rales, wheezes or rhonchi. No accessory muscle use Psych: Age appropriate judgment and insight, normal affect and mood  Assessment and Plan: Dysuria - Plan: nitrofurantoin, macrocrystal-monohydrate, (MACROBID) 100 MG capsule  We will treat with a 7-day course of Macrobid 100 mg twice daily.  He will send a message next week if no improvement.  At that point we will treat for prostatitis with a 10-day course of ciprofloxacin 500 mg twice daily and refer to the urology team.  Follow-up as originally  scheduled. The patient voiced understanding and agreement to the plan.  [place], DO 02/06/21  4:00 PM

## 2021-02-16 ENCOUNTER — Other Ambulatory Visit: Payer: Self-pay | Admitting: Family Medicine

## 2021-02-16 DIAGNOSIS — R300 Dysuria: Secondary | ICD-10-CM

## 2021-02-24 ENCOUNTER — Other Ambulatory Visit: Payer: Self-pay | Admitting: Pulmonary Disease

## 2021-03-10 ENCOUNTER — Other Ambulatory Visit: Payer: Self-pay | Admitting: Surgery

## 2021-03-10 DIAGNOSIS — I712 Thoracic aortic aneurysm, without rupture, unspecified: Secondary | ICD-10-CM

## 2021-04-15 ENCOUNTER — Ambulatory Visit: Payer: 59 | Admitting: Surgery

## 2021-04-15 ENCOUNTER — Other Ambulatory Visit: Payer: 59

## 2021-04-22 ENCOUNTER — Ambulatory Visit: Payer: 59 | Admitting: Surgery

## 2021-04-22 ENCOUNTER — Ambulatory Visit
Admission: RE | Admit: 2021-04-22 | Discharge: 2021-04-22 | Disposition: A | Discharge status: Home / Self Care | Payer: 59 | Source: Ambulatory Visit | Attending: Surgery | Admitting: Surgery

## 2021-04-22 ENCOUNTER — Other Ambulatory Visit: Payer: Self-pay

## 2021-04-22 ENCOUNTER — Encounter: Payer: Self-pay | Admitting: Surgery

## 2021-04-22 VITALS — BP 143/93 | HR 90 | Resp 20 | Ht 72.0 in | Wt 223.0 lb

## 2021-04-22 DIAGNOSIS — I712 Thoracic aortic aneurysm, without rupture, unspecified: Secondary | ICD-10-CM

## 2021-04-22 MED ORDER — IOPAMIDOL (ISOVUE-370) INJECTION 76%
75.0000 mL | Freq: Once | INTRAVENOUS | Status: AC | PRN
  Administered 2021-04-22: 75 mL via INTRAVENOUS

## 2021-04-22 NOTE — Progress Notes (Addendum)
HPI:  The patient is a 60 year old gentleman who returns today for follow-up of a 5.0 cm aortic root aneurysm.  His last echocardiogram and October 2019 showed a trileaflet aortic valve with no regurgitation or stenosis.  Cardiac catheterization on 12/06/2018 showed 50% mid LAD stenosis and a relatively small vessel that did not reach the apex and has been treated medically.  He continues to do well without chest pain or shortness of breath.  He does report that he has been treated for recurrent urinary tract infection and was diagnosed with a colovesical fistula.  He is scheduled for colonoscopy in the near future.  He has been treated with several courses of antibiotics and is getting ready to start a prescription of Cipro.  Current Outpatient Medications  Medication Sig Dispense Refill  . albuterol (VENTOLIN HFA) 108 (90 Base) MCG/ACT inhaler Inhale 2 puffs into the lungs every 6 (six) hours as needed. 18 g 4  . amLODipine (NORVASC) 5 MG tablet Take 1 tablet (5 mg total) by mouth daily. 90 tablet 3  . aspirin EC 81 MG tablet Take 1 tablet (81 mg total) by mouth daily. 90 tablet 3  . loratadine (CLARITIN) 10 MG tablet Take 10 mg by mouth daily.    . metoprolol succinate (TOPROL-XL) 25 MG 24 hr tablet Take 1 tablet (25 mg total) by mouth daily. 90 tablet 3  . omeprazole (PRILOSEC) 40 MG capsule Take 40 mg by mouth every other day.    Marland Kitchen QVAR REDIHALER 80 MCG/ACT inhaler TAKE 2 PUFFS BY MOUTH TWICE A DAY 31.8 g 3  . rosuvastatin (CRESTOR) 40 MG tablet Take 1 tablet (40 mg total) by mouth daily. 90 tablet 3  . sildenafil (VIAGRA) 100 MG tablet TAKE 1 TABLET BY MOUTH DAILY AS NEEDED FOR ERECTILE DYSFUNCTION 10 tablet 3   No current facility-administered medications for this visit.     Physical Exam: BP (!) 143/93 (BP Location: Right Arm, Patient Position: Sitting)   Pulse 90   Resp 20   Ht 6' (1.829 m)   Wt 223 lb (101.2 kg)   SpO2 96% Comment: RA  BMI 30.24 kg/m  He looks  well. Cardiac exam shows a regular rate and rhythm with normal heart sounds.  There is no murmur. Lungs are clear.   Diagnostic Tests:  Narrative & Impression  CLINICAL DATA:  Follow-up thoracic aortic aneurysm  EXAM: CT ANGIOGRAPHY CHEST WITH CONTRAST  TECHNIQUE: Multidetector CT imaging of the chest was performed using the standard protocol during bolus administration of intravenous contrast. Multiplanar CT image reconstructions and MIPs were obtained to evaluate the vascular anatomy.  CONTRAST:  31mL ISOVUE-370 IOPAMIDOL (ISOVUE-370) INJECTION 76%  COMPARISON:  04/23/2020  FINDINGS: Cardiovascular: Dilatation of the aortic root at the level of the sinus of Valsalva is again identified to 5 cm. Sino-tubular junction measures 3.4 cm and the ascending aorta measures approximately 3.7 cm. No evidence of dissection is noted. No cardiac enlargement is seen. Mild coronary calcifications are noted. The pulmonary artery as visualized is within normal limits.  Mediastinum/Nodes: Thoracic inlet is within normal limits. No hilar or mediastinal adenopathy is noted. Calcified hilar nodes are noted on the left consistent with prior granulomatous disease. The esophagus as visualized is within normal limits.  Lungs/Pleura: Lungs are well aerated bilaterally. A few scattered calcified granulomas are noted. No focal infiltrate or sizable effusion is seen.  Upper Abdomen: Upper abdomen shows fatty infiltration of the liver.  Musculoskeletal: No chest wall abnormality. No  acute or significant osseous findings.  Review of the MIP images confirms the above findings.  IMPRESSION: Stable dilatation of the aortic root at the level of the sinus of Valsalva.  Ascending aorta and descending aorta show no dilatation or acute abnormality.   Electronically Signed   By: Inez Catalina M.D.   On: 04/22/2021 09:15      Impression:  He has a stable 5.0 cm aortic root  aneurysm at the sinus level.  His blood pressure is under good control and he has no risk factors that would increase his risk of aortic dissection including no family history of aortic aneurysm or aortic dissection or connective tissue disorders.  His echocardiogram showed a trileaflet aortic valve with no insufficiency or stenosis.  His aneurysm is still below the surgical threshold of 5.5 cm and I have recommended continued follow-up.  I reviewed the CTA images with him and answered all of his questions.  I did caution him about taking Cipro since fluoroquinolones have been linked to increased risk of aortic aneurysm progression and aortic dissection in humans.  I think it would probably be best to try a different antibiotic if possible.  Plan:  He will return to see me in 1 year with a CTA of the chest.  He will continue to follow-up with his PCP and urology concerning his colovesical fistula.  I spent 20 minutes performing this established patient evaluation and > 50% of this time was spent face to face counseling and coordinating the care of this patient's aortic aneurysm.    Gaye Pollack, MD [facility] [phone]

## 2021-04-30 ENCOUNTER — Telehealth: Payer: Self-pay | Admitting: Gastroenterology

## 2021-04-30 NOTE — Telephone Encounter (Signed)
Phillip, [name] [phone]  [name] 19 minutes ago (11:20 AM)   Pt calling wants to schedule colon appt with Dr. Redgie Grayer from [facility] ASAP. CT scan ordered via Urologist.. Plz advise thanks     Lm on vm for patient to return call.   Records placed in Dr. Loletha Carrow IN box for review.

## 2021-04-30 NOTE — Telephone Encounter (Signed)
He needs to be seen in clinic first.  I currently have an opening next Tuesday, June 7 at 8:40 AM (830 arrival)  Please contact him today and put him in that clinic slot.  I will also try to reach his urologist before then to discuss the case.

## 2021-04-30 NOTE — Telephone Encounter (Signed)
Spoke with patient, he states that he had a CT scan at [facility] on 03/29/21. CT showed that he may have a possible fistula. Pt was seen yesterday for cystoscopy at [facility] which seemed fine and they are requesting that patient have a colonoscopy. Patient was told that this was an urgent matter and needed a colonoscopy prior to seeing [facility]. Advised that we received the office note from yesterday but have not received a copy of the CT report. Patient is having [facility] fax [place] a copy of the CT report for Dr. Loletha Carrow to review. Office note from yesterday placed in your box for review. Please advise, thanks.

## 2021-04-30 NOTE — Telephone Encounter (Signed)
Spoke with patient in regards to recommendations. Patient has been scheduled for a follow up with Dr. Loletha Carrow on Tuesday, 05/05/21 at 8:40am, arriving at 8:30 am. Patient is aware that Dr. Loletha Carrow will discuss his case further with his Urologist. Patient verbalized understanding and had no concerns at the end of the call.

## 2021-05-05 ENCOUNTER — Ambulatory Visit: Payer: 59 | Admitting: Gastroenterology

## 2021-05-05 ENCOUNTER — Other Ambulatory Visit: Payer: Self-pay

## 2021-05-05 ENCOUNTER — Encounter: Payer: Self-pay | Admitting: Gastroenterology

## 2021-05-05 VITALS — BP 138/80 | HR 72 | Ht 72.0 in | Wt 226.0 lb

## 2021-05-05 DIAGNOSIS — N321 Vesicointestinal fistula: Secondary | ICD-10-CM

## 2021-05-05 MED ORDER — PLENVU 140 G PO SOLR: 140.0000 g | ORAL | 0 refills | Status: DC

## 2021-05-05 NOTE — Patient Instructions (Signed)
If you are age 59 or older, your body mass index should be between 23-30. Your Body mass index is 30.65 kg/m. If this is out of the aforementioned range listed, please consider follow up with your Primary Care Provider.  If you are age 32 or younger, your body mass index should be between 19-25. Your Body mass index is 30.65 kg/m. If this is out of the aformentioned range listed, please consider follow up with your Primary Care Provider.   __________________________________________________________  The [facility] providers would like to encourage you to use [facility] to communicate with providers for non-urgent requests or questions.  Due to long hold times on the telephone, sending your provider a message by [facility] may be a faster and more efficient way to get a response.  Please allow 48 business hours for a response.  Please remember that this is for non-urgent requests.   You have been scheduled for a colonoscopy. Please follow written instructions given to you at your visit today.  Please pick up your prep supplies at the pharmacy within the next 1-3 days. If you use inhalers (even only as needed), please bring them with you on the day of your procedure.  It was a pleasure to see you today!  Thank you for trusting me with your gastrointestinal care!

## 2021-05-05 NOTE — Progress Notes (Signed)
[facility] Consult Note:  History: Phillip [name] 05/05/2021  Referring provider: Link Snuffer, MD ([facility])  Reason for consult/chief complaint: Discuss colonoscopy (Overdue, per his urologist Dr Phillip [name] he has a fistula found because he had cystoscope last week.)   Subjective  HPI: Phillip [name] was referred by Dr. 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  He has had recurrent pansensitive E. coli UTIs this year, and a CT scan in late April had sigmoid and bladder findings suggestive of fistula.  Recent cystoscopy showed area on posterior bladder wall dome c/w fistula. I saw Phillip [name] for a screening colonoscopy in March 2017, with finding of terminal ileal ulcers felt to be related to the patient's chronic NSAID use.  He also had 3 subcentimeter adenomatous polyps and sigmoid diverticulosis.  Phillip [name] has generally felt well, and denies chronic abdominal pain.  He has not had episodes that sound like diverticulitis.  He denies rectal bleeding, his appetite is good and weight stable.  He was also referred to Dr. Dema [name] of colorectal surgery, and got a call from their office to schedule appointment but was waiting to hear our plan in the office today before doing so.   ROS:  Review of Systems  Constitutional: Negative for appetite change and unexpected weight change.  HENT: Negative for mouth sores and voice change.   Eyes: Negative for pain and redness.  Respiratory: Negative for cough and shortness of breath.   Cardiovascular: Negative for chest pain and palpitations.  Genitourinary: Positive for dysuria. Negative for hematuria.  Musculoskeletal: Negative for arthralgias and myalgias.  Skin: Negative for pallor and rash.  Neurological: Negative for weakness and headaches.  Hematological: Negative for adenopathy.   No overt hematuria ( reportedly had microhematuria)  Past Medical History: Past Medical History:  Diagnosis Date  . Allergy    . Arthritis   . Asthma    Followed by Pulmonology  . CAD (coronary artery disease)   . Chicken pox   . Fistula   . GERD (gastroesophageal reflux disease)   . Hyperlipidemia   . Hypertension   . Thoracic aortic aneurysm [facility])      Past Surgical History: Past Surgical History:  Procedure Laterality Date  . COLONOSCOPY    . CYSTOSCOPY     to check bladder  . DENTAL SURGERY    . ESOPHAGOGASTRODUODENOSCOPY    . LEFT HEART CATH AND CORONARY ANGIOGRAPHY N/A 12/06/2018   Procedure: LEFT HEART CATH AND CORONARY ANGIOGRAPHY;  Surgeon: Burnell Blanks, MD;  Location: [facility];  Service: Cardiovascular;  Laterality: N/A;  . LEG SURGERY Left 2007  . MASTECTOMY    . WISDOM TOOTH EXTRACTION       Family History: Family History  Problem Relation Age of Onset  . Hypertension Mother        Controlled with diet  . Aortic aneurysm Mother   . Hiatal hernia Father   . Hodgkin's lymphoma Child        Remission  . Emphysema Maternal Uncle   . Colon cancer Neg Hx   . Colon polyps Neg Hx   . Esophageal cancer Neg Hx   . Rectal cancer Neg Hx   . Stomach cancer Neg Hx     Social History: Social History   Socioeconomic History  . Marital status: Married    Spouse name: Not on file  . Number of children: 2  . Years of education: Not on file  . Highest education level: Not on file  Occupational History  . Occupation: management health care  Tobacco Use  . Smoking status: Never Smoker  . Smokeless tobacco: Never Used  Vaping Use  . Vaping Use: Never used  Substance and Sexual Activity  . Alcohol use: Yes    Alcohol/week: 0.0 standard drinks    Comment: socially  . Drug use: No  . Sexual activity: Not on file  Other Topics Concern  . Not on file  Social History Narrative  . Not on file   Social Determinants of Health   Financial Resource Strain: Not on file  Food Insecurity: Not on file  Transportation Needs: Not on file  Physical Activity: Not on file   Stress: Not on file  Social Connections: Not on file    Allergies: Allergies  Allergen Reactions  . Bactrim [Sulfamethoxazole-Trimethoprim]     Joint pain and skin rash   . Penicillins Rash    DID THE REACTION INVOLVE: Swelling of the face/tongue/throat, SOB, or low BP? No Sudden or severe rash/hives, skin peeling, or the inside of the mouth or nose? No Did it require medical treatment? No When did it last happen?Childhood allergy If all above answers are "NO", may proceed with cephalosporin use.     Outpatient Meds: Current Outpatient Medications  Medication Sig Dispense Refill  . albuterol (VENTOLIN HFA) 108 (90 Base) MCG/ACT inhaler Inhale 2 puffs into the lungs every 6 (six) hours as needed. 18 g 4  . amLODipine (NORVASC) 5 MG tablet Take 1 tablet (5 mg total) by mouth daily. 90 tablet 3  . aspirin EC 81 MG tablet Take 1 tablet (81 mg total) by mouth daily. 90 tablet 3  . cephALEXin (KEFLEX) 750 MG capsule Take 750 mg by mouth 3 (three) times daily.    Marland Kitchen loratadine (CLARITIN) 10 MG tablet Take 10 mg by mouth daily.    . metoprolol succinate (TOPROL-XL) 25 MG 24 hr tablet Take 1 tablet (25 mg total) by mouth daily. 90 tablet 3  . omeprazole (PRILOSEC) 40 MG capsule Take 40 mg by mouth every other day.    Marland Kitchen PEG-KCl-NaCl-NaSulf-Na Asc-C (PLENVU) 140 g SOLR Take 140 g by mouth as directed. Manufacturer's coupon Universal coupon code:BIN: P2366821; GROUP: [id number]; PCN: [id number]; ID: [id number]; PAY NO MORE $50 1 each 0  . QVAR REDIHALER 80 MCG/ACT inhaler TAKE 2 PUFFS BY MOUTH TWICE A DAY 31.8 g 3  . rosuvastatin (CRESTOR) 40 MG tablet Take 1 tablet (40 mg total) by mouth daily. 90 tablet 3  . sildenafil (VIAGRA) 100 MG tablet TAKE 1 TABLET BY MOUTH DAILY AS NEEDED FOR ERECTILE DYSFUNCTION 10 tablet 3  . Cephalexin 250 MG tablet Take 250 mg by mouth daily as needed.     No current facility-administered medications for this visit.    Urology has put him on chronic suppressive  cephalexin.  ___________________________________________________________________ Objective   Exam:  BP 138/80   Pulse 72   Ht 6' (1.829 m)   Wt 226 lb (102.5 kg)   BMI 30.65 kg/m  Wt Readings from Last 3 Encounters:  05/05/21 226 lb (102.5 kg)  04/22/21 223 lb (101.2 kg)  02/06/21 223 lb (101.2 kg)     General: He is well-appearing  Eyes: sclera anicteric, no redness  ENT: oral mucosa moist without lesions, no cervical or supraclavicular lymphadenopathy  CV: RRR without murmur, S1/S2, no JVD, no peripheral edema  Resp: clear to auscultation bilaterally, normal RR and effort noted  GI: soft, no tenderness, with active bowel sounds. No  guarding or palpable organomegaly noted.  Skin; warm and dry, no rash or jaundice noted  Neuro: awake, alert and oriented x 3. Normal gross motor function and fluent speech  Radiologic Studies:  CLINICAL DATA: Microhematuria.  EXAM: CT ABDOMEN AND PELVIS WITHOUT AND WITH CONTRAST  TECHNIQUE: Multidetector CT imaging of the abdomen and pelvis was performed following the standard protocol before and following the bolus administration of intravenous contrast.  CONTRAST: 125 cc Omnipaque 300  COMPARISON: None.  FINDINGS: Lower chest: Unremarkable.  Hepatobiliary: Very subtle 11 mm hypoattenuating lesion is identified in the posterior right liver, seen only on portal venous phase imaging (image 28/series 5). Subtle lesion also visible on coronal image 100/604. Small stones are seen in the gallbladder. No intrahepatic or extrahepatic biliary dilation.  Pancreas: No focal mass lesion. No dilatation of the main duct. No intraparenchymal cyst. No peripancreatic edema.  Spleen: No splenomegaly. No focal mass lesion.  Adrenals/Urinary Tract: No adrenal nodule or mass.  Precontrast imaging shows no stones in either kidney or ureter. No bladder stones no secondary changes in either kidney or ureter.  R. imaging after IV contrast  administration shows no suspicious enhancing lesion in either kidney.  Delayed post-contrast imaging shows no wall thickening or soft tissue filling defect in either intrarenal collecting system or renal pelvis. Both ureters are well opacified without evidence for wall thickening, soft tissue lesion or focal dilatation.  Bladder wall is tethered to the inferior wall of the sigmoid colon with subtle focal wall thickening of the bladder wall at this location. There is a tiny gas bubble that appears to be intramural in the region of focal bladder wall thickening (visible on axial 84/9 but best demonstrated on coronal images 79 and 80 of series 604 and sagittal image 120 of series 605).. Imaging appearance is highly suggestive of colovesical fistula.  Stomach/Bowel: Stomach is nondistended. Duodenum is normally positioned as is the ligament of Treitz. No small bowel wall thickening. No small bowel dilatation. The terminal ileum is normal. The appendix is normal. Diffuse diverticular disease is seen in the left colon in there is some stranding around the sigmoid segment, likely related to prior episodes of inflammation although acute diverticulitis cannot be excluded (see deep to the rectus fascia on axial image 70 of series 5 corresponding to image 56 of coronal 604).  Vascular/Lymphatic: There is abdominal aortic atherosclerosis without aneurysm. There is no gastrohepatic or hepatoduodenal ligament lymphadenopathy. No retroperitoneal or mesenteric lymphadenopathy. Upper normal left pelvic sidewall lymph nodes evident.  Reproductive: The prostate gland and seminal vesicles are unremarkable.  Other: No intraperitoneal free fluid.  Musculoskeletal: No worrisome lytic or sclerotic osseous abnormality.  IMPRESSION: 1. Focal bladder wall thickening left dome which is tethered by soft tissue bridge to the inferior wall of the adjacent sigmoid colon. Associated tiny gas bubble appears  to be intramural in the bladder wall at this location. Imaging appearance is highly suggestive of colovesical fistula. 2. Diffuse diverticular disease in the left colon with some stranding around the sigmoid segment, likely related to prior episodes of inflammation although there is some hazy edema along the proximal sigmoid colon raising the question of superimposed acute diverticulitis. No evidence for perforation or abscess. 3. Upper normal left pelvic sidewall lymph nodes, likely reactive. 4. Very subtle 11 mm hypoattenuating lesion in the posterior right liver. While likely benign, MRI of the abdomen without and with contrast recommended to further evaluate. 5. Cholelithiasis. 6. Aortic Atherosclerosis (ICD10-I70.0).   Electronically Signed By: Misty Stanley  M.D. On: 03/26/2021 10:00   ____________________________-- Surveillance CT scan for thoracic aortic aneurysm  Narrative & Impression CLINICAL DATA:  Follow-up thoracic aortic aneurysm   EXAM: CT ANGIOGRAPHY CHEST WITH CONTRAST   TECHNIQUE: Multidetector CT imaging of the chest was performed using the standard protocol during bolus administration of intravenous contrast. Multiplanar CT image reconstructions and MIPs were obtained to evaluate the vascular anatomy.   CONTRAST:  45mL ISOVUE-370 IOPAMIDOL (ISOVUE-370) INJECTION 76%   COMPARISON:  04/23/2020   FINDINGS: Cardiovascular: Dilatation of the aortic root at the level of the sinus of Valsalva is again identified to 5 cm. Sino-tubular junction measures 3.4 cm and the ascending aorta measures approximately 3.7 cm. No evidence of dissection is noted. No cardiac enlargement is seen. Mild coronary calcifications are noted. The pulmonary artery as visualized is within normal limits.   Mediastinum/Nodes: Thoracic inlet is within normal limits. No hilar or mediastinal adenopathy is noted. Calcified hilar nodes are noted on the left consistent with prior  granulomatous disease. The esophagus as visualized is within normal limits.   Lungs/Pleura: Lungs are well aerated bilaterally. A few scattered calcified granulomas are noted. No focal infiltrate or sizable effusion is seen.   Upper Abdomen: Upper abdomen shows fatty infiltration of the liver.   Musculoskeletal: No chest wall abnormality. No acute or significant osseous findings.   Review of the MIP images confirms the above findings.   IMPRESSION: Stable dilatation of the aortic root at the level of the sinus of Valsalva.   Ascending aorta and descending aorta show no dilatation or acute abnormality.     Electronically Signed   By: Inez Catalina M.D.   On: 04/22/2021 09:15   Assessment: Encounter Diagnosis  Name Primary?  . Colovesical fistula Yes    The diagnosis appears clear on testing so far.  The role of colonoscopy here is to rule out neoplasia or Crohn's disease.  I believe his TI ulcers seen on last colonoscopy were NSAID related rather than Crohn's. He will need surgical resection and I encouraged him to make the appointment to see Dr. Dema [name] so plans can be made for that. Phillip [name] was agreeable to a colonoscopy with me, and it is scheduled for next week.  The benefits and risks of the planned procedure were described in detail with the patient or (when appropriate) their health care proxy.  Risks were outlined as including, but not limited to, bleeding, infection, perforation, adverse medication reaction leading to cardiac or pulmonary decompensation, pancreatitis (if ERCP).  The limitation of incomplete mucosal visualization was also discussed.  No guarantees or warranties were given.  I messaged to Dr. Dema [name] about Phillip [name] last week, I will send him a copy of this note and also communicated with Dr. Gloriann [name] about it.  Thank you for the courtesy of this consult.  Please call me with any questions or concerns.  Nelida Meuse III  CC: Referring provider noted  above [name], MD Nadeen Landau, MD

## 2021-05-13 ENCOUNTER — Telehealth: Payer: Self-pay | Admitting: Gastroenterology

## 2021-05-13 ENCOUNTER — Other Ambulatory Visit: Payer: Self-pay | Admitting: Gastroenterology

## 2021-05-13 ENCOUNTER — Encounter: Payer: Self-pay | Admitting: Gastroenterology

## 2021-05-13 NOTE — Telephone Encounter (Signed)
Patient called said his insurance is not covering the Plenvu prep medication

## 2021-05-13 NOTE — Telephone Encounter (Signed)
Left a detailed message to inform Phillip [name] that the pharmacy must run the electronic coupon attached to the Rx for a no more than a $ 60 copay

## 2021-05-15 ENCOUNTER — Other Ambulatory Visit: Payer: Self-pay

## 2021-05-15 ENCOUNTER — Encounter: Payer: Self-pay | Admitting: Gastroenterology

## 2021-05-15 ENCOUNTER — Ambulatory Visit (AMBULATORY_SURGERY_CENTER): Payer: 59 | Admitting: Gastroenterology

## 2021-05-15 VITALS — BP 132/74 | HR 65 | Temp 97.5°F | Resp 13 | Ht 72.0 in | Wt 226.0 lb

## 2021-05-15 DIAGNOSIS — N321 Vesicointestinal fistula: Secondary | ICD-10-CM

## 2021-05-15 DIAGNOSIS — K5730 Diverticulosis of large intestine without perforation or abscess without bleeding: Secondary | ICD-10-CM

## 2021-05-15 DIAGNOSIS — D122 Benign neoplasm of ascending colon: Secondary | ICD-10-CM

## 2021-05-15 MED ORDER — SODIUM CHLORIDE 0.9 % IV SOLN: 500.0000 mL | Freq: Once | INTRAVENOUS | Status: DC

## 2021-05-15 NOTE — Progress Notes (Signed)
History reviewed today 

## 2021-05-15 NOTE — Op Note (Signed)
[facility] Patient Name: Phillip [name] Procedure Date: 05/15/2021 7:19 AM MRN: [id number] Endoscopist: Mallie Mussel L. Loletha Carrow , MD Age: 60 Referring MD:  Date of Birth: February 02, 1961 Gender: Male Account #: [id number] Procedure:                Colonoscopy Indications:              Colovesical fistula (see on CT and cystoscopy,                            recurrent UTI) Medicines:                Monitored Anesthesia Care Procedure:                Pre-Anesthesia Assessment:                           - Prior to the procedure, a History and Physical                            was performed, and patient medications and                            allergies were reviewed. The patient's tolerance of                            previous anesthesia was also reviewed. The risks                            and benefits of the procedure and the sedation                            options and risks were discussed with the patient.                            All questions were answered, and informed consent                            was obtained. Prior Anticoagulants: The patient has                            taken no previous anticoagulant or antiplatelet                            agents. ASA Grade Assessment: III - A patient with                            severe systemic disease. After reviewing the risks                            and benefits, the patient was deemed in                            satisfactory condition to undergo the procedure.  After obtaining informed consent, the colonoscope                            was passed under direct vision. Throughout the                            procedure, the patient's blood pressure, pulse, and                            oxygen saturations were monitored continuously. The                            Colonoscope was introduced through the anus and                            advanced to the the cecum, identified by                             appendiceal orifice and ileocecal valve. The                            colonoscopy was performed without difficulty. The                            patient tolerated the procedure well. The quality                            of the bowel preparation was good. The ileocecal                            valve, appendiceal orifice, and rectum were                            photographed. The bowel preparation used was Plenvu. Scope In: 7:32:37 AM Scope Out: 7:45:35 AM Scope Withdrawal Time: 0 hours 8 minutes 36 seconds  Total Procedure Duration: 0 hours 12 minutes 58 seconds  Findings:                 The perianal and digital rectal examinations were                            normal.                           A 1 mm polyp was found in the proximal ascending                            colon. The polyp was sessile. The polyp was removed                            with a cold snare. Resection and retrieval were                            complete.  A few diverticula were found in the right colon.                           Many diverticula were found in the left colon. At                            the rectosigmoid junction, there was mild patchy                            erythema, some of which had a polypoid appearance.                           The exam was otherwise without abnormality on                            direct and retroflexion views. Complications:            No immediate complications. Estimated Blood Loss:     Estimated blood loss was minimal. Impression:               - One 1 mm polyp in the proximal ascending colon,                            removed with a cold snare. Resected and retrieved.                           - Diverticulosis in the right colon.                           - Diverticulosis in the left colon.                           - The examination was otherwise normal on direct                            and  retroflexion views. Recommendation:           - Patient has a contact number available for                            emergencies. The signs and symptoms of potential                            delayed complications were discussed with the                            patient. Return to normal activities tomorrow.                            Written discharge instructions were provided to the                            patient.                           -  Resume previous diet.                           - Continue present medications.                           - Await pathology results.                           - Repeat colonoscopy is recommended for                            surveillance. The colonoscopy date will be                            determined after pathology results from today's                            exam become available for review.                           - See Colo-rectal surgeon as scheduled to plan                            sigmoid resection. Majesty Oehlert L. Loletha Carrow, MD 05/15/2021 7:56:02 AM This report has been signed electronically.

## 2021-05-15 NOTE — Progress Notes (Signed)
pt tolerated well. VSS. awake and to recovery. Report given to RN.  

## 2021-05-15 NOTE — Patient Instructions (Signed)
Resume previous diet and medications. Awaiting pathology results. Repeat colonoscopy for surveillance. Colonoscopy date to be determined after pathology results available for review. See Colo-rectal Surgeon for Sigmoid resection.  YOU HAD AN ENDOSCOPIC PROCEDURE TODAY AT [facility]:   Refer to the procedure report that was given to you for any specific questions about what was found during the examination.  If the procedure report does not answer your questions, please call your gastroenterologist to clarify.  If you requested that your care partner not be given the details of your procedure findings, then the procedure report has been included in a sealed envelope for you to review at your convenience later.  YOU SHOULD EXPECT: Some feelings of bloating in the abdomen. Passage of more gas than usual.  Walking can help get rid of the air that was put into your GI tract during the procedure and reduce the bloating. If you had a lower endoscopy (such as a colonoscopy or flexible sigmoidoscopy) you may notice spotting of blood in your stool or on the toilet paper. If you underwent a bowel prep for your procedure, you may not have a normal bowel movement for a few days.  Please Note:  You might notice some irritation and congestion in your nose or some drainage.  This is from the oxygen used during your procedure.  There is no need for concern and it should clear up in a day or so.  SYMPTOMS TO REPORT IMMEDIATELY:  Following lower endoscopy (colonoscopy or flexible sigmoidoscopy):  Excessive amounts of blood in the stool  Significant tenderness or worsening of abdominal pains  Swelling of the abdomen that is new, acute  Fever of 100F or higher  For urgent or emergent issues, a gastroenterologist can be reached at any hour by calling [phone]. Do not use MyChart messaging for urgent concerns.    DIET:  We do recommend a small meal at first, but then you may proceed to your  regular diet.  Drink plenty of fluids but you should avoid alcoholic beverages for 24 hours.  ACTIVITY:  You should plan to take it easy for the rest of today and you should NOT DRIVE or use heavy machinery until tomorrow (because of the sedation medicines used during the test).    FOLLOW UP: Our staff will call the number listed on your records 48-72 hours following your procedure to check on you and address any questions or concerns that you may have regarding the information given to you following your procedure. If we do not reach you, we will leave a message.  We will attempt to reach you two times.  During this call, we will ask if you have developed any symptoms of COVID 19. If you develop any symptoms (ie: fever, flu-like symptoms, shortness of breath, cough etc.) before then, please call [phone].  If you test positive for Covid 19 in the 2 weeks post procedure, please call and report this information to [place].    If any biopsies were taken you will be contacted by phone or by letter within the next 1-3 weeks.  Please call us at [phone] if you have not heard about the biopsies in 3 weeks.    SIGNATURES/CONFIDENTIALITY: You and/or your care partner have signed paperwork which will be entered into your electronic medical record.  These signatures attest to the fact that that the information above on your After Visit Summary has been reviewed and is understood.  Full responsibility of the confidentiality  of this discharge information lies with you and/or your care-partner.  

## 2021-05-15 NOTE — Progress Notes (Signed)
Called to room to assist during endoscopic procedure.  Patient ID and intended procedure confirmed with present staff. Received instructions for my participation in the procedure from the performing physician.  

## 2021-05-19 ENCOUNTER — Telehealth: Payer: Self-pay

## 2021-05-19 NOTE — Telephone Encounter (Signed)
  Follow up Call-  Call back number 05/15/2021 11/07/2019  Post procedure Call Back phone  # [phone]  Permission to leave phone message Yes Yes  Some recent data might be hidden     Patient questions:  Do you have a fever, pain , or abdominal swelling? No. Pain Score  0 *  Have you tolerated food without any problems? Yes.    Have you been able to return to your normal activities? Yes.    Do you have any questions about your discharge instructions: Diet   No. Medications  No. Follow up visit  No.  Do you have questions or concerns about your Care? No.  Actions: * If pain score is 4 or above: No action needed, pain <4.  Have you developed a fever since your procedure? no  2.   Have you had an respiratory symptoms (SOB or cough) since your procedure? no  3.   Have you tested positive for COVID 19 since your procedure no  4.   Have you had any family members/close contacts diagnosed with the COVID 19 since your procedure?  no   If yes to any of these questions please route to Joylene John, RN and Joella Prince, RN

## 2021-05-21 ENCOUNTER — Encounter: Payer: Self-pay | Admitting: Gastroenterology

## 2021-06-08 ENCOUNTER — Telehealth: Payer: Self-pay | Admitting: *Deleted

## 2021-06-08 NOTE — Telephone Encounter (Signed)
   [facility] Pre-operative Risk Assessment    Patient Name: Phillip [name]  DOB: 22-Feb-1961 MRN: [id number]  HEARTCARE STAFF:  - IMPORTANT!!!!!! Under Visit Info/Reason for Call, type in Other and utilize the format Clearance MM/DD/YY or Clearance TBD. Do not use dashes or single digits. - Please review there is not already an duplicate clearance open for this procedure. - If request is for dental extraction, please clarify the # of teeth to be extracted. - If the patient is currently at the dentist's office, call Pre-Op Callback Staff (MA/nurse) to input urgent request.  - If the patient is not currently in the dentist office, please route to the Pre-Op pool.  Request for surgical clearance:  What type of surgery is being performed? COLON SURGERY  When is this surgery scheduled? TBD  What type of clearance is required (medical clearance vs. Pharmacy clearance to hold med vs. Both)? MEDICAL  Are there any medications that need to be held prior to surgery and how long?  ASA   Practice name and name of physician performing surgery? [facility]; DR. Harrell Gave WHITE  What is the office phone number? [phone]   7.   What is the office fax number? [phone] ATTNMammie Lorenzo, LPN  8.   Anesthesia type (None, local, MAC, general) ? GENERAL   Julaine Hua 06/08/2021, 4:33 PM  _________________________________________________________________   (provider comments below)

## 2021-06-10 NOTE — Telephone Encounter (Signed)
Phillip [name] is returning pre op's call. Please advise.

## 2021-06-11 NOTE — Telephone Encounter (Signed)
   Name: Phillip [name]  DOB: 02-23-1961  MRN: [id number]   Primary Cardiologist: [name], MD  Chart reviewed as part of pre-operative protocol coverage.   Left a voicemail for ongoing preop assessment.   Abigail Butts, PA-C 06/11/2021, 1:14 PM

## 2021-06-12 NOTE — Telephone Encounter (Signed)
   Name: Phillip [name]  DOB: 09/17/1961  MRN: [id number]   Primary Cardiologist: [name], MD  Chart reviewed as part of pre-operative protocol coverage. Patient was contacted 06/12/2021 in reference to pre-operative risk assessment for pending surgery as outlined below.  Phillip [name] was last seen on 09/22/20 by Dr. Angelena Form.  Since that day, Phillip [name] has done well from a cardiac standpoint. He reports good blood pressure control and stable aortic aneurysm on recent CTA chest followed by Dr. Cyndia Bent. He can easily complete 4 METs without anginal complaints.  Therefore, based on ACC/AHA guidelines, the patient would be at acceptable risk for the planned procedure without further cardiovascular testing.   The patient was advised that if he develops new symptoms prior to surgery to contact our office to arrange for a follow-up visit, and he verbalized understanding.  If needed, patient can hold aspirin 5-7 days prior to his upcoming surgery with plans to restart when cleared to do so by his surgeon.  I will route this recommendation to the requesting party via Epic fax function and remove from pre-op pool. Please call with questions.  Abigail Butts, PA-C 06/12/2021, 10:27 AM

## 2021-07-27 ENCOUNTER — Other Ambulatory Visit: Payer: Self-pay | Admitting: Cardiovascular Disease

## 2021-07-28 NOTE — Progress Notes (Signed)
Sent message, via epic in basket, requesting orders in epic from surgeon.  

## 2021-07-30 ENCOUNTER — Ambulatory Visit: Payer: Self-pay | Admitting: Surgery

## 2021-07-30 NOTE — Patient Instructions (Signed)
DUE TO COVID-19 ONLY ONE VISITOR IS ALLOWED TO COME WITH YOU AND STAY IN THE WAITING ROOM ONLY DURING PRE OP AND PROCEDURE.   **NO VISITORS ARE ALLOWED IN THE SHORT STAY AREA OR RECOVERY ROOM!!**  IF YOU WILL BE ADMITTED INTO THE HOSPITAL YOU ARE ALLOWED ONLY TWO SUPPORT PEOPLE DURING VISITATION HOURS ONLY (10AM -8PM)   The support person(s) may change daily. The support person(s) must pass our screening, gel in and out, and wear a mask at all times, including in the patient's room. Patients must also wear a mask when staff or their support person are in the room.  No visitors under the age of 39. Any visitor under the age of 63 must be accompanied by an adult.    COVID SWAB TESTING MUST BE COMPLETED ON:  08/17/21 **MUST PRESENT COMPLETED FORM AT TESTING SITE**    [address] [place] (backside of the building) You are not required to quarantine, however you are required to wear a well-fitted mask when you are out and around people not in your household.  Hand Hygiene often Do NOT share personal items Notify your provider if you are in close contact with someone who has COVID or you develop fever 100.4 or greater, new onset of sneezing, cough, sore throat, shortness of breath or body aches.  [address], [address], must go inside of the hospital, NOT A DRIVE THRU!  (Must self quarantine after testing. Follow instructions on handout.)       Your procedure is scheduled on: 08/19/21   Report to [facility] Main  Entrance    Report to admitting at : 6:15 AM   Call this number if you have problems the morning of surgery [phone]   DRINK 2 PRESURGERY ENSURE DRINKS THE NIGHT BEFORE SURGERY AT:   1000 PM AND 1 PRESURGERY DRINK THE DAY OF THE PROCEDURE 3 HOURS PRIOR TO SCHEDULED SURGERY. NO SOLIDS AFTER MIDNIGHT THE DAY PRIOR TO THE SURGERY. NOTHING BY MOUTH EXCEPT CLEAR LIQUIDS UNTIL THREE HOURS PRIOR TO  SCHEDULED SURGERY. PLEASE FINISH PRESURGERY ENSURE DRINK PER SURGEON ORDER 3 HOURS PRIOR TO SCHEDULED SURGERY TIME WHICH NEEDS TO BE COMPLETED AT: 5:30 AM.   CLEAR LIQUID DIET  Foods Allowed                                                                     Foods Excluded  Water, Black Coffee and tea, regular and decaf                             liquids that you cannot  Plain Jell-O in any flavor  (No red)                                           see through such as: Fruit ices (not with fruit pulp)  milk, soups, orange juice              Iced Popsicles (No red)                                    All solid food                                   Apple juices Sports drinks like Gatorade (No red) Lightly seasoned clear broth or consume(fat free) Sugar,  Sample Menu Breakfast                                Lunch                                     Supper Cranberry juice                    Beef broth                            Chicken broth Jell-O                                     Grape juice                           Apple juice Coffee or tea                        Jell-O                                      Popsicle                                                Coffee or tea                        Coffee or tea        Drink plenty liquids the day of the prep.    Oral Hygiene is also important to reduce your risk of infection.                                    Remember - BRUSH YOUR TEETH THE MORNING OF SURGERY WITH YOUR REGULAR TOOTHPASTE   Do NOT smoke after Midnight   Take these medicines the morning of surgery with A SIP OF WATER: cetirizine,metoprolol,amlodipine,flomax,omeprazole.  DO NOT TAKE ANY ORAL DIABETIC MEDICATIONS DAY OF YOUR SURGERY                              You may not have any metal on your body including hair pins, jewelry, and body piercing  Do not wear lotions, powders, perfumes/cologne, or deodorant               Men may shave face and neck.   Do not bring valuables to the hospital. [place].   Contacts, dentures or bridgework may not be worn into surgery.   Bring small overnight bag day of surgery.    Patients discharged the day of surgery will not be allowed to drive home.   Special Instructions: Bring a copy of your healthcare power of attorney and living will documents         the day of surgery if you haven't scanned them in before.              Please read over the following fact sheets you were given: IF YOU HAVE QUESTIONS ABOUT YOUR PRE OP INSTRUCTIONS PLEASE CALL [phone]   [facility] - Preparing for Surgery Before surgery, you can play an important role.  Because skin is not sterile, your skin needs to be as free of germs as possible.  You can reduce the number of germs on your skin by washing with CHG (chlorahexidine gluconate) soap before surgery.  CHG is an antiseptic cleaner which kills germs and bonds with the skin to continue killing germs even after washing. Please DO NOT use if you have an allergy to CHG or antibacterial soaps.  If your skin becomes reddened/irritated stop using the CHG and inform your nurse when you arrive at Short Stay. Do not shave (including legs and underarms) for at least 48 hours prior to the first CHG shower.  You may shave your face/neck. Please follow these instructions carefully:  1.  Shower with CHG Soap the night before surgery and the  morning of Surgery.  2.  If you choose to wash your hair, wash your hair first as usual with your  normal  shampoo.  3.  After you shampoo, rinse your hair and body thoroughly to remove the  shampoo.                           4.  Use CHG as you would any other liquid soap.  You can apply chg directly  to the skin and wash                       Gently with a scrungie or clean washcloth.  5.  Apply the CHG Soap to your body ONLY FROM THE NECK DOWN.   Do not use on  face/ open                           Wound or open sores. Avoid contact with eyes, ears mouth and genitals (private parts).                       Wash face,  Genitals (private parts) with your normal soap.             6.  Wash thoroughly, paying special attention to the area where your surgery  will be performed.  7.  Thoroughly rinse your body with warm water from the neck down.  8.  DO NOT shower/wash with your normal soap after using and rinsing off  the CHG Soap.  9.  Pat yourself dry with a clean towel.            10.  Wear clean pajamas.            11.  Place clean sheets on your bed the night of your first shower and do not  sleep with pets. Day of Surgery : Do not apply any lotions/deodorants the morning of surgery.  Please wear clean clothes to the hospital/surgery center.  FAILURE TO FOLLOW THESE INSTRUCTIONS MAY RESULT IN THE CANCELLATION OF YOUR SURGERY PATIENT SIGNATURE_________________________________  NURSE SIGNATURE__________________________________  ________________________________________________________________________

## 2021-07-31 ENCOUNTER — Other Ambulatory Visit: Payer: Self-pay

## 2021-07-31 ENCOUNTER — Encounter (HOSPITAL_COMMUNITY)
Admission: RE | Admit: 2021-07-31 | Discharge: 2021-07-31 | Disposition: A | Discharge status: Home / Self Care | Payer: 59 | Source: Ambulatory Visit | Attending: Surgery | Admitting: Surgery

## 2021-07-31 ENCOUNTER — Encounter (HOSPITAL_COMMUNITY): Payer: Self-pay

## 2021-07-31 DIAGNOSIS — Z01812 Encounter for preprocedural laboratory examination: Secondary | ICD-10-CM | POA: Diagnosis not present

## 2021-07-31 LAB — COMPREHENSIVE METABOLIC PANEL
ALT: 37 U/L (ref 0–44)
AST: 29 U/L (ref 15–41)
Albumin: 4.4 g/dL (ref 3.5–5.0)
Alkaline Phosphatase: 54 U/L (ref 38–126)
Anion gap: 9 (ref 5–15)
BUN: 15 mg/dL (ref 6–20)
CO2: 26 mmol/L (ref 22–32)
Calcium: 9.4 mg/dL (ref 8.9–10.3)
Chloride: 106 mmol/L (ref 98–111)
Creatinine, Ser: 1.35 mg/dL — ABNORMAL HIGH (ref 0.61–1.24)
GFR, Estimated: >60 mL/min (ref >60)
Glucose, Bld: 91 mg/dL (ref 70–99)
Potassium: 3.8 mmol/L (ref 3.5–5.1)
Sodium: 141 mmol/L (ref 135–145)
Total Bilirubin: 0.8 mg/dL (ref 0.3–1.2)
Total Protein: 7.6 g/dL (ref 6.5–8.1)

## 2021-07-31 LAB — CBC WITH DIFFERENTIAL/PLATELET
Abs Immature Granulocytes: 0.05 10*3/uL (ref 0.00–0.07)
Basophils Absolute: 0.1 10*3/uL (ref 0.0–0.1)
Basophils Relative: 1 %
Eosinophils Absolute: 0.4 10*3/uL (ref 0.0–0.5)
Eosinophils Relative: 5 %
HCT: 46.3 % (ref 39.0–52.0)
Hemoglobin: 15.3 g/dL (ref 13.0–17.0)
Immature Granulocytes: 1 %
Lymphocytes Relative: 23 %
Lymphs Abs: 1.9 10*3/uL (ref 0.7–4.0)
MCH: 30.2 pg (ref 26.0–34.0)
MCHC: 33 g/dL (ref 30.0–36.0)
MCV: 91.5 fL (ref 80.0–100.0)
Monocytes Absolute: 0.7 10*3/uL (ref 0.1–1.0)
Monocytes Relative: 8 %
Neutro Abs: 5.3 10*3/uL (ref 1.7–7.7)
Neutrophils Relative %: 62 %
Platelets: 260 10*3/uL (ref 150–400)
RBC: 5.06 MIL/uL (ref 4.22–5.81)
RDW: 13 % (ref 11.5–15.5)
WBC: 8.3 10*3/uL (ref 4.0–10.5)
nRBC: 0 % (ref 0.0–0.2)

## 2021-07-31 LAB — HEMOGLOBIN A1C
Hgb A1c MFr Bld: 6 % — ABNORMAL HIGH (ref 4.8–5.6)
Mean Plasma Glucose: 125.5 mg/dL

## 2021-07-31 NOTE — Progress Notes (Signed)
COVID Vaccine Completed: Yes Date COVID Vaccine completed: 2022. X 4 COVID vaccine manufacturer: [place] test: 08/17/21  PCP - Dr. Nolene Ebbs Cardiologist - Dr. Roney Mans. LOV: 09/22/20. Clearance: Daleen Snook Kneger: PAC: 06/12/21: Epic.  Chest x-ray - CT chest: 04/22/21 EKG - 09/22/20 Stress Test -  ECHO - 09/05/18 Cardiac Cath - 12/06/18 Pacemaker/ICD device last checked:  Sleep Study -  CPAP -   Fasting Blood Sugar -  Checks Blood Sugar _____ times a day  Blood Thinner Instructions: Aspirin Instructions: Will be held: 5 days before surgery as per Dr. Dema Severin and Dr. Wannetta Sender instructions Last Dose:  Anesthesia review: Hx: HTN,CAD  Patient denies shortness of breath, fever, cough and chest pain at PAT appointment   Patient verbalized understanding of instructions that were given to them at the PAT appointment. Patient was also instructed that they will need to review over the PAT instructions again at home before surgery.

## 2021-08-10 NOTE — Anesthesia Preprocedure Evaluation (Addendum)
Anesthesia Evaluation  Patient identified by MRN, date of birth, ID band Patient awake    Reviewed: Allergy & Precautions, NPO status , Patient's Chart, lab work & pertinent test results  Airway Mallampati: II  TM Distance: >3 FB     Dental   Pulmonary asthma ,    breath sounds clear to auscultation       Cardiovascular hypertension, + CAD   Rhythm:Regular Rate:Normal     Neuro/Psych    GI/Hepatic Neg liver ROS, GERD  ,  Endo/Other  negative endocrine ROS  Renal/GU negative Renal ROS     Musculoskeletal   Abdominal   Peds  Hematology   Anesthesia Other Findings   Reproductive/Obstetrics                           Anesthesia Physical Anesthesia Plan  ASA: 3  Anesthesia Plan: General   Post-op Pain Management:    Induction: Intravenous  PONV Risk Score and Plan: 2 and Ondansetron, Propofol infusion and Midazolam  Airway Management Planned: Oral ETT  Additional Equipment:   Intra-op Plan:   Post-operative Plan:   Informed Consent: I have reviewed the patients History and Physical, chart, labs and discussed the procedure including the risks, benefits and alternatives for the proposed anesthesia with the patient or authorized representative who has indicated his/her understanding and acceptance.     Dental advisory given  Plan Discussed with: Anesthesiologist and CRNA  Anesthesia Plan Comments: (See APP note by Durel Salts, FNP )      Anesthesia Quick Evaluation

## 2021-08-10 NOTE — Progress Notes (Signed)
Anesthesia Chart Review:   Case: [id number] Date/Time: 08/19/21 0815   Procedures:      ROBOTIC SIGMOIDECTOMY     LOW ANTERIOR BOWEL RESECTION     FLEXIBLE SIGMOIDOSCOPY   Anesthesia type: General   Pre-op diagnosis: colovesical fistula   Location: [facility] ROOM 02 / [facility]   Surgeons: Ileana Roup, MD       DISCUSSION: Pt is 60 years old with hx CAD (mild-mod by 2020 cath), thoracic aortic aneurysm (5.0 cm by 04/22/21 CT), HTN, asthma  VS: Pulse 88   Temp 36.6 C (Oral)   Ht 6' (1.829 m)   Wt 104.3 kg   SpO2 100%   BMI 31.19 kg/m   PROVIDERS: - PCP is [name], DO - Cardiologist is [name], MD. Last office visit 09/22/20. Cleared for surgery at acceptable risk by Roby Lofts, PA on 06/12/21 - CT surgeon is Gilford Raid, MD who follows aortic root aneurysm. Last office visit 04/22/21    LABS: Labs reviewed: Acceptable for surgery. (all labs ordered are listed, but only abnormal results are displayed)  Labs Reviewed  HEMOGLOBIN A1C - Abnormal; Notable for the following components:      Result Value   Hgb A1c MFr Bld 6.0 (*)    All other components within normal limits  COMPREHENSIVE METABOLIC PANEL - Abnormal; Notable for the following components:   Creatinine, Ser 1.35 (*)    All other components within normal limits  CBC WITH DIFFERENTIAL/PLATELET     IMAGES: CT angio chest aorta 04/22/21:  - Stable dilatation of the aortic root at the level of the sinus of Valsalva (5.0 cm) - Ascending aorta and descending aorta show no dilatation or acute abnormality.   EKG 09/22/20:  sinus rhythm   CV: Cardiac cath 12/06/18:  Mid RCA lesion is 10% stenosed. Prox Cx lesion is 20% stenosed. Mid LAD lesion is 50% stenosed.  1. Moderate non-obstructive appearing stenosis in the small caliber mid LAD. The mid LAD beyond the takeoff of the moderate caliber diagonal branch is a small vessel (1.75-2.0 mm). The stenosis is smooth and eccentric.  2.  Mild non-obstructive plaque in the RCA and Circumflex.  3. Normal filling pressures.   Echo 09/05/18:  - Left ventricle: The cavity size was normal. There was mild concentric hypertrophy. Systolic function was normal. The estimated ejection fraction was in the range of 50% to 55%. Wall motion was normal; there were no regional wall motion abnormalities.  - Aorta: Aortic root dimension: 48 mm (ED). Ascending aortic diameter: 39 mm (S).  - Aortic root: The aortic root was moderately dilated.  - Ascending aorta: The ascending aorta was mildly dilated.  - Mitral valve: Calcified annulus. There was trivial regurgitation.  - Atrial septum: There was increased thickness of the septum, consistent with lipomatous hypertrophy.  - Tricuspid valve: There was trivial regurgitation.   Past Medical History:  Diagnosis Date   Allergy    Arthritis    Asthma    Followed by Pulmonology   CAD (coronary artery disease)    Chicken pox    Fistula    GERD (gastroesophageal reflux disease)    Hyperlipidemia    Hypertension    Thoracic aortic aneurysm ([place])     Past Surgical History:  Procedure Laterality Date   COLONOSCOPY     CYSTOSCOPY     to check bladder   DENTAL SURGERY     ESOPHAGOGASTRODUODENOSCOPY     LEFT HEART CATH AND CORONARY ANGIOGRAPHY N/A  12/06/2018   Procedure: LEFT HEART CATH AND CORONARY ANGIOGRAPHY;  Surgeon: Burnell Blanks, MD;  Location: [facility];  Service: Cardiovascular;  Laterality: N/A;   LEG SURGERY Left 2007   MASTECTOMY     WISDOM TOOTH EXTRACTION      MEDICATIONS:  albuterol (VENTOLIN HFA) 108 (90 Base) MCG/ACT inhaler   amLODipine (NORVASC) 5 MG tablet   aspirin EC 81 MG tablet   Cephalexin 250 MG tablet   cetirizine (ZYRTEC) 10 MG tablet   metoprolol succinate (TOPROL-XL) 25 MG 24 hr tablet   Omeprazole 20 MG TBEC   QVAR REDIHALER 80 MCG/ACT inhaler   rosuvastatin (CRESTOR) 40 MG tablet   sildenafil (VIAGRA) 100 MG tablet   tamsulosin  (FLOMAX) 0.4 MG CAPS capsule    0.9 %  sodium chloride infusion    If no changes, I anticipate pt can proceed with surgery as scheduled.   Phillip Cass, PhD, FNP-BC [facility]/Anesthesiology Phone: [phone] 08/10/2021 3:49 PM

## 2021-08-17 ENCOUNTER — Other Ambulatory Visit: Payer: Self-pay | Admitting: Surgery

## 2021-08-18 ENCOUNTER — Encounter (HOSPITAL_COMMUNITY): Payer: Self-pay | Admitting: Surgery

## 2021-08-18 LAB — SARS CORONAVIRUS 2 (TAT 6-24 HRS): SARS Coronavirus 2: NEGATIVE

## 2021-08-19 ENCOUNTER — Inpatient Hospital Stay (HOSPITAL_COMMUNITY)
Admission: RE | Admit: 2021-08-19 | Discharge: 2021-08-21 | DRG: 982 | Disposition: A | Discharge status: Home / Self Care | Payer: 59 | Attending: Surgery | Admitting: Surgery

## 2021-08-19 ENCOUNTER — Encounter (HOSPITAL_COMMUNITY)
Admission: RE | Disposition: A | Discharge status: Home / Self Care | Payer: Self-pay | Source: Home / Self Care | Attending: Surgery

## 2021-08-19 ENCOUNTER — Encounter (HOSPITAL_COMMUNITY): Payer: Self-pay | Admitting: Surgery

## 2021-08-19 ENCOUNTER — Inpatient Hospital Stay (HOSPITAL_COMMUNITY): Payer: 59 | Admitting: Emergency Medicine

## 2021-08-19 ENCOUNTER — Other Ambulatory Visit: Payer: Self-pay

## 2021-08-19 ENCOUNTER — Inpatient Hospital Stay (HOSPITAL_COMMUNITY): Payer: 59 | Admitting: Anesthesiology

## 2021-08-19 DIAGNOSIS — J45909 Unspecified asthma, uncomplicated: Secondary | ICD-10-CM | POA: Diagnosis present

## 2021-08-19 DIAGNOSIS — Z9049 Acquired absence of other specified parts of digestive tract: Secondary | ICD-10-CM

## 2021-08-19 DIAGNOSIS — Z880 Allergy status to penicillin: Secondary | ICD-10-CM | POA: Diagnosis not present

## 2021-08-19 DIAGNOSIS — Z8249 Family history of ischemic heart disease and other diseases of the circulatory system: Secondary | ICD-10-CM

## 2021-08-19 DIAGNOSIS — I2510 Atherosclerotic heart disease of native coronary artery without angina pectoris: Secondary | ICD-10-CM | POA: Diagnosis present

## 2021-08-19 DIAGNOSIS — I10 Essential (primary) hypertension: Secondary | ICD-10-CM | POA: Diagnosis present

## 2021-08-19 DIAGNOSIS — Z87440 Personal history of urinary (tract) infections: Secondary | ICD-10-CM

## 2021-08-19 DIAGNOSIS — Z807 Family history of other malignant neoplasms of lymphoid, hematopoietic and related tissues: Secondary | ICD-10-CM

## 2021-08-19 DIAGNOSIS — N390 Urinary tract infection, site not specified: Secondary | ICD-10-CM | POA: Diagnosis present

## 2021-08-19 DIAGNOSIS — K219 Gastro-esophageal reflux disease without esophagitis: Secondary | ICD-10-CM | POA: Diagnosis present

## 2021-08-19 DIAGNOSIS — N321 Vesicointestinal fistula: Principal | ICD-10-CM | POA: Diagnosis present

## 2021-08-19 DIAGNOSIS — Z825 Family history of asthma and other chronic lower respiratory diseases: Secondary | ICD-10-CM

## 2021-08-19 DIAGNOSIS — Z882 Allergy status to sulfonamides status: Secondary | ICD-10-CM

## 2021-08-19 DIAGNOSIS — K5790 Diverticulosis of intestine, part unspecified, without perforation or abscess without bleeding: Secondary | ICD-10-CM | POA: Diagnosis present

## 2021-08-19 DIAGNOSIS — Z800 Family history of malignant neoplasm of digestive organs: Secondary | ICD-10-CM | POA: Diagnosis not present

## 2021-08-19 HISTORY — PX: FLEXIBLE SIGMOIDOSCOPY: SHX5431

## 2021-08-19 HISTORY — PX: BOWEL RESECTION: SHX1257

## 2021-08-19 LAB — TYPE AND SCREEN
ABO/RH(D): B POS
Antibody Screen: NEGATIVE

## 2021-08-19 LAB — ABO/RH: ABO/RH(D): B POS

## 2021-08-19 SURGERY — COLECTOMY, SIGMOID, ROBOT-ASSISTED
Anesthesia: General | Site: Abdomen

## 2021-08-19 MED ORDER — PROPOFOL 10 MG/ML IV BOLUS
INTRAVENOUS | Status: DC | PRN
  Administered 2021-08-19: 180 mg via INTRAVENOUS

## 2021-08-19 MED ORDER — BISACODYL 5 MG PO TBEC: 20.0000 mg | DELAYED_RELEASE_TABLET | Freq: Once | ORAL | Status: DC

## 2021-08-19 MED ORDER — ROCURONIUM BROMIDE 10 MG/ML (PF) SYRINGE
PREFILLED_SYRINGE | INTRAVENOUS | Status: DC | PRN
  Administered 2021-08-19: 30 mg via INTRAVENOUS
  Administered 2021-08-19: 70 mg via INTRAVENOUS
  Administered 2021-08-19: 30 mg via INTRAVENOUS

## 2021-08-19 MED ORDER — BUPIVACAINE LIPOSOME 1.3 % IJ SUSP
INTRAMUSCULAR | Status: DC | PRN
  Administered 2021-08-19: 20 mL

## 2021-08-19 MED ORDER — ENSURE SURGERY PO LIQD
237.0000 mL | Freq: Two times a day (BID) | ORAL | Status: DC
  Administered 2021-08-19 – 2021-08-20 (×3): 237 mL via ORAL

## 2021-08-19 MED ORDER — LACTATED RINGERS IR SOLN
Status: DC | PRN
  Administered 2021-08-19: 1000 mL

## 2021-08-19 MED ORDER — ROSUVASTATIN CALCIUM 20 MG PO TABS
40.0000 mg | ORAL_TABLET | Freq: Every day | ORAL | Status: DC
  Administered 2021-08-19 – 2021-08-21 (×3): 40 mg via ORAL
  Filled 2021-08-19 (×3): qty 2

## 2021-08-19 MED ORDER — ALUM & MAG HYDROXIDE-SIMETH 200-200-20 MG/5ML PO SUSP: 30.0000 mL | Freq: Four times a day (QID) | ORAL | Status: DC | PRN

## 2021-08-19 MED ORDER — ACETAMINOPHEN 500 MG PO TABS
1000.0000 mg | ORAL_TABLET | Freq: Four times a day (QID) | ORAL | Status: DC
  Administered 2021-08-19 – 2021-08-21 (×7): 1000 mg via ORAL
  Filled 2021-08-19 (×7): qty 2

## 2021-08-19 MED ORDER — ALVIMOPAN 12 MG PO CAPS
12.0000 mg | ORAL_CAPSULE | Freq: Two times a day (BID) | ORAL | Status: DC
  Administered 2021-08-20: 12 mg via ORAL
  Filled 2021-08-19: qty 1

## 2021-08-19 MED ORDER — BUPIVACAINE LIPOSOME 1.3 % IJ SUSP
INTRAMUSCULAR | Status: AC
  Filled 2021-08-19: qty 20

## 2021-08-19 MED ORDER — ALVIMOPAN 12 MG PO CAPS
12.0000 mg | ORAL_CAPSULE | ORAL | Status: AC
  Administered 2021-08-19: 12 mg via ORAL
  Filled 2021-08-19: qty 1

## 2021-08-19 MED ORDER — LACTATED RINGERS IV SOLN: INTRAVENOUS | Status: DC

## 2021-08-19 MED ORDER — AMLODIPINE BESYLATE 5 MG PO TABS
5.0000 mg | ORAL_TABLET | Freq: Every day | ORAL | Status: DC
  Administered 2021-08-20 – 2021-08-21 (×2): 5 mg via ORAL
  Filled 2021-08-19 (×2): qty 1

## 2021-08-19 MED ORDER — KETAMINE HCL 10 MG/ML IJ SOLN
INTRAMUSCULAR | Status: DC | PRN
  Administered 2021-08-19: 40 mg via INTRAVENOUS
  Administered 2021-08-19 (×2): 10 mg via INTRAVENOUS

## 2021-08-19 MED ORDER — SUGAMMADEX SODIUM 500 MG/5ML IV SOLN
INTRAVENOUS | Status: AC
  Filled 2021-08-19: qty 5

## 2021-08-19 MED ORDER — CHLORHEXIDINE GLUCONATE CLOTH 2 % EX PADS: 6.0000 | MEDICATED_PAD | Freq: Once | CUTANEOUS | Status: DC

## 2021-08-19 MED ORDER — ONDANSETRON HCL 4 MG/2ML IJ SOLN
INTRAMUSCULAR | Status: AC
  Filled 2021-08-19: qty 4

## 2021-08-19 MED ORDER — 0.9 % SODIUM CHLORIDE (POUR BTL) OPTIME
TOPICAL | Status: DC | PRN
  Administered 2021-08-19: 2000 mL

## 2021-08-19 MED ORDER — ROCURONIUM BROMIDE 10 MG/ML (PF) SYRINGE
PREFILLED_SYRINGE | INTRAVENOUS | Status: AC
  Filled 2021-08-19: qty 10

## 2021-08-19 MED ORDER — DEXAMETHASONE SODIUM PHOSPHATE 10 MG/ML IJ SOLN
INTRAMUSCULAR | Status: DC | PRN
  Administered 2021-08-19: 10 mg via INTRAVENOUS

## 2021-08-19 MED ORDER — METHYLENE BLUE 0.5 % INJ SOLN
INTRAVENOUS | Status: DC | PRN
  Administered 2021-08-19: 8 mL

## 2021-08-19 MED ORDER — HEPARIN SODIUM (PORCINE) 5000 UNIT/ML IJ SOLN
5000.0000 [IU] | Freq: Once | INTRAMUSCULAR | Status: AC
  Administered 2021-08-19: 5000 [IU] via SUBCUTANEOUS
  Filled 2021-08-19: qty 1

## 2021-08-19 MED ORDER — ACETAMINOPHEN 500 MG PO TABS
1000.0000 mg | ORAL_TABLET | ORAL | Status: AC
  Administered 2021-08-19: 1000 mg via ORAL
  Filled 2021-08-19: qty 2

## 2021-08-19 MED ORDER — ROCURONIUM BROMIDE 10 MG/ML (PF) SYRINGE
PREFILLED_SYRINGE | INTRAVENOUS | Status: AC
  Filled 2021-08-19: qty 20

## 2021-08-19 MED ORDER — METHYLENE BLUE 0.5 % INJ SOLN
INTRAVENOUS | Status: AC
  Filled 2021-08-19: qty 10

## 2021-08-19 MED ORDER — HYDROMORPHONE HCL 1 MG/ML IJ SOLN: 0.5000 mg | INTRAMUSCULAR | Status: DC | PRN

## 2021-08-19 MED ORDER — METOPROLOL SUCCINATE ER 25 MG PO TB24
25.0000 mg | ORAL_TABLET | Freq: Every day | ORAL | Status: DC
  Administered 2021-08-20 – 2021-08-21 (×2): 25 mg via ORAL
  Filled 2021-08-19 (×2): qty 1

## 2021-08-19 MED ORDER — DEXAMETHASONE SODIUM PHOSPHATE 10 MG/ML IJ SOLN
INTRAMUSCULAR | Status: AC
  Filled 2021-08-19: qty 2

## 2021-08-19 MED ORDER — ORAL CARE MOUTH RINSE: 15.0000 mL | Freq: Once | OROMUCOSAL | Status: AC

## 2021-08-19 MED ORDER — PROPOFOL 10 MG/ML IV BOLUS
INTRAVENOUS | Status: AC
  Filled 2021-08-19: qty 40

## 2021-08-19 MED ORDER — SODIUM CHLORIDE 0.9 % IR SOLN
Status: DC | PRN
  Administered 2021-08-19: 1000 mL

## 2021-08-19 MED ORDER — PHENYLEPHRINE 40 MCG/ML (10ML) SYRINGE FOR IV PUSH (FOR BLOOD PRESSURE SUPPORT)
PREFILLED_SYRINGE | INTRAVENOUS | Status: AC
  Filled 2021-08-19: qty 10

## 2021-08-19 MED ORDER — LIDOCAINE 2% (20 MG/ML) 5 ML SYRINGE
INTRAMUSCULAR | Status: DC | PRN
  Administered 2021-08-19: 100 mg via INTRAVENOUS

## 2021-08-19 MED ORDER — NEOMYCIN SULFATE 500 MG PO TABS: 1000.0000 mg | ORAL_TABLET | ORAL | Status: DC

## 2021-08-19 MED ORDER — SODIUM CHLORIDE 0.9 % IV SOLN
2.0000 g | INTRAVENOUS | Status: AC
  Administered 2021-08-19: 2 g via INTRAVENOUS
  Filled 2021-08-19: qty 2

## 2021-08-19 MED ORDER — TRAMADOL HCL 50 MG PO TABS
50.0000 mg | ORAL_TABLET | Freq: Four times a day (QID) | ORAL | Status: DC | PRN
  Administered 2021-08-19 – 2021-08-20 (×3): 50 mg via ORAL
  Filled 2021-08-19 (×4): qty 1

## 2021-08-19 MED ORDER — PHENYLEPHRINE HCL-NACL 20-0.9 MG/250ML-% IV SOLN
INTRAVENOUS | Status: AC
  Filled 2021-08-19: qty 500

## 2021-08-19 MED ORDER — SUCCINYLCHOLINE CHLORIDE 200 MG/10ML IV SOSY
PREFILLED_SYRINGE | INTRAVENOUS | Status: DC | PRN
  Administered 2021-08-19: 140 mg via INTRAVENOUS

## 2021-08-19 MED ORDER — LORATADINE 10 MG PO TABS
10.0000 mg | ORAL_TABLET | Freq: Every day | ORAL | Status: DC
  Administered 2021-08-20 – 2021-08-21 (×2): 10 mg via ORAL
  Filled 2021-08-19 (×2): qty 1

## 2021-08-19 MED ORDER — PANTOPRAZOLE SODIUM 40 MG PO TBEC
40.0000 mg | DELAYED_RELEASE_TABLET | Freq: Every day | ORAL | Status: DC
  Administered 2021-08-20 – 2021-08-21 (×2): 40 mg via ORAL
  Filled 2021-08-19 (×2): qty 1

## 2021-08-19 MED ORDER — FENTANYL CITRATE (PF) 250 MCG/5ML IJ SOLN
INTRAMUSCULAR | Status: AC
  Filled 2021-08-19: qty 5

## 2021-08-19 MED ORDER — FENTANYL CITRATE (PF) 100 MCG/2ML IJ SOLN
INTRAMUSCULAR | Status: DC | PRN
  Administered 2021-08-19: 50 ug via INTRAVENOUS
  Administered 2021-08-19: 150 ug via INTRAVENOUS
  Administered 2021-08-19: 50 ug via INTRAVENOUS

## 2021-08-19 MED ORDER — BUDESONIDE 0.25 MG/2ML IN SUSP
0.2500 mg | Freq: Two times a day (BID) | RESPIRATORY_TRACT | Status: DC
  Administered 2021-08-19 – 2021-08-21 (×4): 0.25 mg via RESPIRATORY_TRACT
  Filled 2021-08-19 (×4): qty 2

## 2021-08-19 MED ORDER — MIDAZOLAM HCL 5 MG/5ML IJ SOLN
INTRAMUSCULAR | Status: DC | PRN
Start: 2021-08-19 — End: 2021-08-19
  Administered 2021-08-19: 2 mg via INTRAVENOUS

## 2021-08-19 MED ORDER — DIPHENHYDRAMINE HCL 12.5 MG/5ML PO ELIX: 12.5000 mg | ORAL_SOLUTION | Freq: Four times a day (QID) | ORAL | Status: DC | PRN

## 2021-08-19 MED ORDER — BUPIVACAINE LIPOSOME 1.3 % IJ SUSP: 20.0000 mL | Freq: Once | INTRAMUSCULAR | Status: DC

## 2021-08-19 MED ORDER — BUPIVACAINE-EPINEPHRINE (PF) 0.25% -1:200000 IJ SOLN
INTRAMUSCULAR | Status: DC | PRN
  Administered 2021-08-19: 30 mL

## 2021-08-19 MED ORDER — ENSURE PRE-SURGERY PO LIQD: 592.0000 mL | Freq: Once | ORAL | Status: DC

## 2021-08-19 MED ORDER — DIPHENHYDRAMINE HCL 50 MG/ML IJ SOLN: 12.5000 mg | Freq: Four times a day (QID) | INTRAMUSCULAR | Status: DC | PRN

## 2021-08-19 MED ORDER — KETAMINE HCL 10 MG/ML IJ SOLN
INTRAMUSCULAR | Status: AC
  Filled 2021-08-19: qty 1

## 2021-08-19 MED ORDER — METRONIDAZOLE 500 MG PO TABS: 1000.0000 mg | ORAL_TABLET | ORAL | Status: DC

## 2021-08-19 MED ORDER — SPY AGENT GREEN - (INDOCYANINE FOR INJECTION)
INTRAMUSCULAR | Status: DC | PRN
  Administered 2021-08-19: 2 mL via INTRAVENOUS

## 2021-08-19 MED ORDER — SUGAMMADEX SODIUM 500 MG/5ML IV SOLN
INTRAVENOUS | Status: DC | PRN
Start: 2021-08-19 — End: 2021-08-19
  Administered 2021-08-19: 300 mg via INTRAVENOUS

## 2021-08-19 MED ORDER — POLYETHYLENE GLYCOL 3350 17 GM/SCOOP PO POWD
1.0000 | Freq: Once | ORAL | Status: DC
  Filled 2021-08-19: qty 255

## 2021-08-19 MED ORDER — ONDANSETRON HCL 4 MG/2ML IJ SOLN
INTRAMUSCULAR | Status: DC | PRN
  Administered 2021-08-19: 4 mg via INTRAVENOUS

## 2021-08-19 MED ORDER — SUCCINYLCHOLINE CHLORIDE 200 MG/10ML IV SOSY
PREFILLED_SYRINGE | INTRAVENOUS | Status: AC
  Filled 2021-08-19: qty 10

## 2021-08-19 MED ORDER — ONDANSETRON HCL 4 MG PO TABS: 4.0000 mg | ORAL_TABLET | Freq: Four times a day (QID) | ORAL | Status: DC | PRN

## 2021-08-19 MED ORDER — ALBUTEROL SULFATE (2.5 MG/3ML) 0.083% IN NEBU
3.0000 mL | INHALATION_SOLUTION | Freq: Four times a day (QID) | RESPIRATORY_TRACT | Status: DC | PRN
  Administered 2021-08-20: 3 mL via RESPIRATORY_TRACT
  Filled 2021-08-19: qty 3

## 2021-08-19 MED ORDER — CHLORHEXIDINE GLUCONATE 0.12 % MT SOLN
15.0000 mL | Freq: Once | OROMUCOSAL | Status: AC
  Administered 2021-08-19: 15 mL via OROMUCOSAL

## 2021-08-19 MED ORDER — ENSURE PRE-SURGERY PO LIQD: 296.0000 mL | Freq: Once | ORAL | Status: DC

## 2021-08-19 MED ORDER — TAMSULOSIN HCL 0.4 MG PO CAPS
0.4000 mg | ORAL_CAPSULE | Freq: Every day | ORAL | Status: DC
  Administered 2021-08-20 – 2021-08-21 (×2): 0.4 mg via ORAL
  Filled 2021-08-19 (×2): qty 1

## 2021-08-19 MED ORDER — HYDRALAZINE HCL 20 MG/ML IJ SOLN
10.0000 mg | INTRAMUSCULAR | Status: DC | PRN
Start: 2021-08-19 — End: 2021-08-21

## 2021-08-19 MED ORDER — HEPARIN SODIUM (PORCINE) 5000 UNIT/ML IJ SOLN
5000.0000 [IU] | Freq: Three times a day (TID) | INTRAMUSCULAR | Status: DC
  Administered 2021-08-19 – 2021-08-21 (×5): 5000 [IU] via SUBCUTANEOUS
  Filled 2021-08-19 (×5): qty 1

## 2021-08-19 MED ORDER — ONDANSETRON HCL 4 MG/2ML IJ SOLN: 4.0000 mg | Freq: Four times a day (QID) | INTRAMUSCULAR | Status: DC | PRN

## 2021-08-19 MED ORDER — LACTATED RINGERS IV SOLN: INTRAVENOUS | Status: DC | PRN

## 2021-08-19 MED ORDER — BUPIVACAINE-EPINEPHRINE 0.25% -1:200000 IJ SOLN
INTRAMUSCULAR | Status: AC
  Filled 2021-08-19: qty 1

## 2021-08-19 MED ORDER — LIDOCAINE HCL (PF) 2 % IJ SOLN
INTRAMUSCULAR | Status: AC
  Filled 2021-08-19: qty 10

## 2021-08-19 MED ORDER — HYDROMORPHONE HCL 1 MG/ML IJ SOLN: 0.2500 mg | INTRAMUSCULAR | Status: DC | PRN

## 2021-08-19 MED ORDER — ESMOLOL HCL 100 MG/10ML IV SOLN
INTRAVENOUS | Status: AC
  Filled 2021-08-19: qty 10

## 2021-08-19 MED ORDER — MIDAZOLAM HCL 2 MG/2ML IJ SOLN
INTRAMUSCULAR | Status: AC
  Filled 2021-08-19: qty 2

## 2021-08-19 SURGICAL SUPPLY — 118 items
APL PRP STRL LF DISP 70% ISPRP (MISCELLANEOUS) ×2
APPLIER CLIP 5 13 M/L LIGAMAX5 (MISCELLANEOUS)
APPLIER CLIP ROT 10 11.4 M/L (STAPLE)
APR CLP MED LRG 11.4X10 (STAPLE)
APR CLP MED LRG 5 ANG JAW (MISCELLANEOUS)
BAG COUNTER SPONGE SURGICOUNT (BAG) IMPLANT
BAG SPNG CNTER NS LX DISP (BAG)
BLADE EXTENDED COATED 6.5IN (ELECTRODE) ×3 IMPLANT
CANNULA REDUC XI 12-8 STAPL (CANNULA) ×3
CANNULA REDUCER 12-8 DVNC XI (CANNULA) ×2 IMPLANT
CELLS DAT CNTRL 66122 CELL SVR (MISCELLANEOUS) IMPLANT
CHLORAPREP W/TINT 26 (MISCELLANEOUS) ×3 IMPLANT
CLIP APPLIE 5 13 M/L LIGAMAX5 (MISCELLANEOUS) IMPLANT
CLIP APPLIE ROT 10 11.4 M/L (STAPLE) IMPLANT
CLIP LIGATING HEMO O LOK GREEN (MISCELLANEOUS) IMPLANT
COVER SURGICAL LIGHT HANDLE (MISCELLANEOUS) ×6 IMPLANT
COVER TIP SHEARS 8 DVNC (MISCELLANEOUS) ×2 IMPLANT
COVER TIP SHEARS 8MM DA VINCI (MISCELLANEOUS) ×3
DECANTER SPIKE VIAL GLASS SM (MISCELLANEOUS) ×3 IMPLANT
DEVICE TROCAR PUNCTURE CLOSURE (ENDOMECHANICALS) IMPLANT
DRAIN CHANNEL 19F RND (DRAIN) ×3 IMPLANT
DRAPE ARM DVNC X/XI (DISPOSABLE) ×8 IMPLANT
DRAPE COLUMN DVNC XI (DISPOSABLE) ×2 IMPLANT
DRAPE DA VINCI XI ARM (DISPOSABLE) ×12
DRAPE DA VINCI XI COLUMN (DISPOSABLE) ×3
DRAPE SURG IRRIG POUCH 19X23 (DRAPES) ×3 IMPLANT
DRSG OPSITE POSTOP 4X10 (GAUZE/BANDAGES/DRESSINGS) IMPLANT
DRSG OPSITE POSTOP 4X6 (GAUZE/BANDAGES/DRESSINGS) ×1 IMPLANT
DRSG OPSITE POSTOP 4X8 (GAUZE/BANDAGES/DRESSINGS) IMPLANT
DRSG TEGADERM 2-3/8X2-3/4 SM (GAUZE/BANDAGES/DRESSINGS) ×15 IMPLANT
DRSG TEGADERM 4X4.75 (GAUZE/BANDAGES/DRESSINGS) ×3 IMPLANT
ELECT REM PT RETURN 15FT ADLT (MISCELLANEOUS) ×3 IMPLANT
ENDOLOOP SUT PDS II  0 18 (SUTURE)
ENDOLOOP SUT PDS II 0 18 (SUTURE) IMPLANT
EVACUATOR SILICONE 100CC (DRAIN) ×3 IMPLANT
GAUZE SPONGE 2X2 8PLY STRL LF (GAUZE/BANDAGES/DRESSINGS) ×2 IMPLANT
GAUZE SPONGE 4X4 12PLY STRL (GAUZE/BANDAGES/DRESSINGS) IMPLANT
GLOVE SURG ENC MOIS LTX SZ7.5 (GLOVE) ×9 IMPLANT
GLOVE SURG UNDER LTX SZ8 (GLOVE) ×9 IMPLANT
GOWN STRL REUS W/TWL XL LVL3 (GOWN DISPOSABLE) ×15 IMPLANT
GRASPER SUT TROCAR 14GX15 (MISCELLANEOUS) IMPLANT
HOLDER FOLEY CATH W/STRAP (MISCELLANEOUS) ×3 IMPLANT
KIT PROCEDURE DA VINCI SI (MISCELLANEOUS)
KIT PROCEDURE DVNC SI (MISCELLANEOUS) IMPLANT
KIT TURNOVER KIT A (KITS) ×3 IMPLANT
NDL INSUFFLATION 14GA 120MM (NEEDLE) ×2 IMPLANT
NEEDLE INSUFFLATION 14GA 120MM (NEEDLE) ×3 IMPLANT
PACK CARDIOVASCULAR III (CUSTOM PROCEDURE TRAY) ×3 IMPLANT
PACK COLON (CUSTOM PROCEDURE TRAY) ×3 IMPLANT
PAD POSITIONING PINK XL (MISCELLANEOUS) ×3 IMPLANT
PENCIL SMOKE EVACUATOR (MISCELLANEOUS) IMPLANT
PROTECTOR NERVE ULNAR (MISCELLANEOUS) ×6 IMPLANT
RELOAD STAPLE 45 3.5 BLU DVNC (STAPLE) IMPLANT
RELOAD STAPLE 45 4.3 GRN DVNC (STAPLE) IMPLANT
RELOAD STAPLE 60 3.5 BLU DVNC (STAPLE) IMPLANT
RELOAD STAPLE 60 4.3 GRN DVNC (STAPLE) IMPLANT
RELOAD STAPLER 3.5X45 BLU DVNC (STAPLE) IMPLANT
RELOAD STAPLER 3.5X60 BLU DVNC (STAPLE) IMPLANT
RELOAD STAPLER 4.3X45 GRN DVNC (STAPLE) IMPLANT
RELOAD STAPLER 4.3X60 GRN DVNC (STAPLE) ×4 IMPLANT
RETRACTOR WND ALEXIS 18 MED (MISCELLANEOUS) IMPLANT
RTRCTR WOUND ALEXIS 18CM MED (MISCELLANEOUS)
SCISSORS LAP 5X35 DISP (ENDOMECHANICALS) IMPLANT
SEAL CANN UNIV 5-8 DVNC XI (MISCELLANEOUS) ×8 IMPLANT
SEAL XI 5MM-8MM UNIVERSAL (MISCELLANEOUS) ×12
SEALER VESSEL DA VINCI XI (MISCELLANEOUS) ×3
SEALER VESSEL EXT DVNC XI (MISCELLANEOUS) ×2 IMPLANT
SET IRRIG TUBING LAPAROSCOPIC (IRRIGATION / IRRIGATOR) ×3 IMPLANT
SLEEVE ADV FIXATION 5X100MM (TROCAR) IMPLANT
SOLUTION ELECTROLUBE (MISCELLANEOUS) ×3 IMPLANT
SPONGE GAUZE 2X2 STER 10/PKG (GAUZE/BANDAGES/DRESSINGS) ×1
STAPLER 60 DA VINCI SURE FORM (STAPLE) ×3
STAPLER 60 SUREFORM DVNC (STAPLE) IMPLANT
STAPLER CANNULA SEAL DVNC XI (STAPLE) ×2 IMPLANT
STAPLER CANNULA SEAL XI (STAPLE) ×3
STAPLER ECHELON POWER CIR 29 (STAPLE) ×1 IMPLANT
STAPLER ECHELON POWER CIR 31 (STAPLE) IMPLANT
STAPLER RELOAD 3.5X45 BLU DVNC (STAPLE)
STAPLER RELOAD 3.5X45 BLUE (STAPLE)
STAPLER RELOAD 3.5X60 BLU DVNC (STAPLE)
STAPLER RELOAD 3.5X60 BLUE (STAPLE)
STAPLER RELOAD 4.3X45 GREEN (STAPLE)
STAPLER RELOAD 4.3X45 GRN DVNC (STAPLE)
STAPLER RELOAD 4.3X60 GREEN (STAPLE) ×6
STAPLER RELOAD 4.3X60 GRN DVNC (STAPLE) ×4
STAPLER SHEATH (SHEATH) ×3
STAPLER SHEATH ENDOWRIST DVNC (SHEATH) ×2 IMPLANT
STOPCOCK 4 WAY LG BORE MALE ST (IV SETS) ×6 IMPLANT
SURGILUBE 2OZ TUBE FLIPTOP (MISCELLANEOUS) ×3 IMPLANT
SUT MNCRL AB 4-0 PS2 18 (SUTURE) ×3 IMPLANT
SUT PDS AB 1 CT1 27 (SUTURE) IMPLANT
SUT PDS AB 1 TP1 96 (SUTURE) IMPLANT
SUT PROLENE 0 CT 2 (SUTURE) IMPLANT
SUT PROLENE 2 0 KS (SUTURE) ×3 IMPLANT
SUT PROLENE 2 0 SH DA (SUTURE) IMPLANT
SUT SILK 2 0 (SUTURE)
SUT SILK 2 0 SH CR/8 (SUTURE) IMPLANT
SUT SILK 2-0 18XBRD TIE 12 (SUTURE) IMPLANT
SUT SILK 3 0 (SUTURE) ×3
SUT SILK 3 0 SH CR/8 (SUTURE) ×3 IMPLANT
SUT SILK 3-0 18XBRD TIE 12 (SUTURE) ×2 IMPLANT
SUT V-LOC BARB 180 2/0GR6 GS22 (SUTURE)
SUT VIC AB 3-0 SH 18 (SUTURE) IMPLANT
SUT VIC AB 3-0 SH 27 (SUTURE)
SUT VIC AB 3-0 SH 27XBRD (SUTURE) IMPLANT
SUT VICRYL 0 UR6 27IN ABS (SUTURE) ×3 IMPLANT
SUTURE V-LC BRB 180 2/0GR6GS22 (SUTURE) IMPLANT
SYR 10ML LL (SYRINGE) ×3 IMPLANT
SYS LAPSCP GELPORT 120MM (MISCELLANEOUS)
SYS WOUND ALEXIS 18CM MED (MISCELLANEOUS) ×3
SYSTEM LAPSCP GELPORT 120MM (MISCELLANEOUS) IMPLANT
SYSTEM WOUND ALEXIS 18CM MED (MISCELLANEOUS) ×2 IMPLANT
TAPE UMBILICAL 1/8 X36 TWILL (MISCELLANEOUS) ×3 IMPLANT
TOWEL OR NON WOVEN STRL DISP B (DISPOSABLE) ×3 IMPLANT
TRAY FOLEY MTR SLVR 16FR STAT (SET/KITS/TRAYS/PACK) ×3 IMPLANT
TROCAR ADV FIXATION 5X100MM (TROCAR) ×3 IMPLANT
TUBING CONNECTING 10 (TUBING) ×6 IMPLANT
TUBING INSUFFLATION 10FT LAP (TUBING) ×3 IMPLANT

## 2021-08-19 NOTE — Anesthesia Procedure Notes (Signed)
Procedure Name: Intubation Date/Time: 08/19/2021 8:54 AM Performed by: Lavina Hamman, CRNA Pre-anesthesia Checklist: Patient identified, Emergency Drugs available, Suction available, Patient being monitored and Timeout performed Patient Re-evaluated:Patient Re-evaluated prior to induction Oxygen Delivery Method: Circle system utilized Preoxygenation: Pre-oxygenation with 100% oxygen Induction Type: IV induction Ventilation: Mask ventilation without difficulty Laryngoscope Size: 4 and Glidescope Grade View: Grade I Tube type: Oral Tube size: 7.0 mm Number of attempts: 1 Airway Equipment and Method: Stylet Placement Confirmation: ETT inserted through vocal cords under direct vision, positive ETCO2, CO2 detector and breath sounds checked- equal and bilateral Secured at: 23 cm Tube secured with: Tape Dental Injury: Teeth and Oropharynx as per pre-operative assessment  Difficulty Due To: Difficulty was anticipated Comments: Patient presented with short chin and large neck and head.  Elective glidescope ATOI.

## 2021-08-19 NOTE — Anesthesia Postprocedure Evaluation (Signed)
Anesthesia Post Note  Patient: Phillip [name]  Procedure(s) Performed: ROBOTIC TAKEDOWN OF COLOVESICAL FISTULA WITH BILATERAL TAP BLOCK AND INTRAOPERATIVE ASSESSMENT OF PERFUSION USING FIREFLY (Abdomen) ROBOTIC LOW ANTERIOR BOWEL RESECTION (Abdomen) FLEXIBLE SIGMOIDOSCOPY     Patient location during evaluation: PACU Anesthesia Type: General Level of consciousness: awake Pain management: pain level controlled Vital Signs Assessment: post-procedure vital signs reviewed and stable Respiratory status: spontaneous breathing Cardiovascular status: stable Postop Assessment: no apparent nausea or vomiting Anesthetic complications: no   No notable events documented.  Last Vitals:  Vitals:   08/19/21 1400 08/19/21 1449  BP: 135/83 133/80  Pulse: 75 83  Resp: 18 16  Temp: 36.6 C (!) 36.4 C  SpO2: 98% 98%    Last Pain:  Vitals:   08/19/21 1449  TempSrc: Oral  PainSc:                  Zaira Iacovelli

## 2021-08-19 NOTE — Plan of Care (Signed)
Patient walked to chair, complains of zero pain and demonstrated proper use of his Incentive Spirometer reaching up to 2500.

## 2021-08-19 NOTE — Transfer of Care (Signed)
Immediate Anesthesia Transfer of Care Note  Patient: Phillip [name]  Procedure(s) Performed: Procedure(s): ROBOTIC TAKEDOWN OF COLOVESICAL FISTULA WITH BILATERAL TAP BLOCK AND INTRAOPERATIVE ASSESSMENT OF PERFUSION USING FIREFLY (N/A) ROBOTIC LOW ANTERIOR BOWEL RESECTION (N/A) FLEXIBLE SIGMOIDOSCOPY (N/A)  Patient Location: PACU  Anesthesia Type:General  Level of Consciousness:  sedated, patient cooperative and responds to stimulation  Airway & Oxygen Therapy:Patient Spontanous Breathing and Patient connected to face mask oxgen  Post-op Assessment:  Report given to PACU RN and Post -op Vital signs reviewed and stable  Post vital signs:  Reviewed and stable  Last Vitals:  Vitals:   08/19/21 0635  BP: (!) 148/91  Pulse: 81  Resp: 18  Temp: 36.4 C  SpO2: 60%    Complications: No apparent anesthesia complications

## 2021-08-19 NOTE — Op Note (Signed)
PATIENT: Phillip [name]  60 y.o. male  Patient Care Team: [name], DO as PCP - General (Family Medicine) Burnell Blanks, MD as PCP - Cardiology (Cardiology)  PREOP DIAGNOSIS: colovesical fistula  POSTOP DIAGNOSIS: colovesical fistula  PROCEDURE:  Robotic low anterior resection with colorectal anastomosis Intraoperative assessment of perfusion (ICG) Flexible sigmoidoscopy Bilateral transversus abdominus plane blocks  SURGEON: Sharon Mt. Zoe Nordin, MD  ASSISTANT: Phillip Boston MD  ANESTHESIA: General endotracheal  EBL: 50 mL Total I/O In: 1100 [I.V.:1100] Out: 550 [Urine:500; Blood:50]  DRAINS: None  SPECIMEN: Rectosigmoid colon - open end proximal  COUNTS: Sponge, needle and instrument counts were reported correct x2  FINDINGS:  Mid-distal sigmoid fairly densely adherent to the dome of the bladder on the left side. All diseased sigmoid was removed and taken down to the level of the proximal rectum. A well perfused, tension free, hemostatic, air tight 29 mm EEA colorectal anastomosis fashioned 14 cm from the anal verge by flexible sigmoidoscopy.  NARRATIVE: Informed consent was verified. He was taken to the operating room, placed supine on the operating table and SCD's were applied. General endotracheal anesthesia was induced without difficulty. The patient was then positioned in the lithotomy position with Allen stirrups.  Pressure points were then evaluated and padded.  He was secured to the operating table.  A foley catheter was then placed by nursing under sterile conditions. Hair on the abdomen was clipped.  The abdomen was then prepped and draped in the standard sterile fashion. Surgical timeout was called indicating the correct patient, procedure, positioning and need for preoperative antibiotics.   An OG tube was placed by anesthesia and confirmed to be to suction.  At Palmer's point, a stab incision was created and the Veress needle was  introduced into the peritoneal cavity on the first attempt.  Intraperitoneal location was confirmed by the aspiration and saline drop test.  Pneumoperitoneum was established to a maximum pressure of 15 mmHg using CO2.  Following this, the abdomen was marked for planned trocar sites.  Just to the right and cephalad to the umbilicus, an 8 mm incision was created and an 8 mm blunt tipped robotic trocar was cautiously placed into the peritoneal cavity.  The laparoscope was inserted and demonstrated no evidence of trocar site nor Veress needle site complications.  The Veress needle was removed.  Bilateral transversus abdominis plane blocks were then created using a dilute mixture of Exparel with Marcaine.  3 additional 8 mm robotic trochars were placed under direct visualization roughly in a line extending from the right ASIS towards the left upper quadrant. The bladder was inspected and noted to be at/below the pubic symphysis.  Staying 3 fingerbreadths above the pubic symphysis, an incision was created and the 12 mm robotic trocar inserted directed cephalad into the peritoneal cavity under direct visualization.  An additional 5 mm assist port was placed in the right lateral abdomen under direct visualization.  The abdomen was surveyed and there was a loop of mid/distal sigmoid colon adherent to the dome of the bladder on the left side.  There is no other remarkable findings. He was positioned in Trendelenburg with some left side up.  Small bowel was carefully retracted out of the pelvis.  The robot was then docked and I went to the console.   We began by taking down the attachments of the sigmoid colon of the bladder.  This was done sharply.  We were able to disconnect the sigmoid colon fistula from the bladder.  There is no obvious hole within the bladder.  Attachments of the sigmoid colon were then taken down from the intersigmoid fossa.  The rectosigmoid colon was grasped and elevated anteriorly.  Beginning with a  medial to lateral approach, the peritoneum overlying the presacral space was carefully incised.  The TME plane was readily gained working in a plane between the fascia propria of the rectum and the presacral fascia.  Hypogastric nerves were seen going along the the presacral fascia and were protected free of injury.  Working more proximally, the mesorectum and sigmoid mesentery were carefully mobilized off of the peritoneum.  The left ureter was identified and protected free of injury.  The left gonadal vessels were identified and protected.  These were both swept "down."  The superior hemorrhoidal and IMA pedicles were identified. Further mesocolon was mobilized proximally staying in this plane between the retroperitoneum proper and the mesocolon. Attention was then turned to the lateral portion of dissection.  The sigmoid colon was retracted to the right.  The sigmoid colon was fully mobilized and working proximal to the diseased segment of colon, the descending colon was mobilized by incising the Phillip [name] line of Toldt.  This was done all the way up to the level of the splenic flexure.  The associated mesocolon was also mobilized medially.  The left ureter again was confirmed to be well away for plan dissection and well away from the vasculature which had been dissected medially.  The rectosigmoid colon was elevated anteriorly. The left ureter was re-identified. The IMA was clear of this. The IMA was then divided with the vessel sealer. The stump was inspected and noted to be completely hemostatic with a good seal.  The mesentery was divided out to the point of planned proximal division.  Working more distally, the rectum was identified where the tinea had splayed and there were loss of appendices epiploica.  This also corresponded to a location overlying the sacral promontory and anatomically with the proximal rectum.  The mesentery out to this level was then cleared using the vessel sealer. The distal point of  transection on the proximal rectum was identified.   A 60 mm green load robotic stapler was then placed through the 12 mm port and introduced into the peritoneal cavity.  He has a somewhat capacious rectum.  The rectum was divided with 2 firing of the stapler.  The stump was intact and healthy in appearance.   Attention was turned to performing a perfusion test. ICG was administered by anesthesia and at the level of the cleared mesentery proximally, there was excellent uptake of the tracer.  The rectum was also well perfused in appearance.  There was a visible pulse in the mesentery out to the level of the cleared colon at the level of the proximal sigmoid/descending colon junction.  This colon is also supple and healthy in appearance without any thickening.  This reached into the pelvis without any difficulty and remained in that location without any tension. A locking grasper was then placed on the sigmoid staple line.   The bladder was then backfilled with dilute methylene blue.  We instilled approximately 550 cc of dilute methylene blue and the bladder is well distended.  There is no extravasation of blue dye from the area where his colon had been attached.  This was observed for approximately 2 minutes and there is no dye leakage.  The Foley catheter was then placed back to gravity drainage and the bladder emptied.  Attention was  turned to the extracorporeal portion of the procedure.  The robot was undocked.  I scrubbed back in.  Using the 12 mm trocar site, a Pfannenstiel incision was created and incorporated the fascial opening through the 12 mm port site.  The rectus fascia was incised and then elevated.  The rectus muscle was mobilized free of the overlying fascia.  The peritoneum was incised in the midline well above the location of the bladder.  An Biddle wound protector was placed.  Towels were placed around the field.  The divided colon was passed through the wound protector.  The point of  proximal division was identified and was again on a healthy segment of supple colon with a palpable pulse in the mesentery. This was pink in color.  A pursestring device was applied.  A 2-0 Prolene on a Keith needle was passed.  The colon was divided and passed off with the open end being proximal.  EEA sizers were then introduced and a 29 mm EEA selected.  "Belt loops" consisting of 3-0 silk were placed around the pursestring suture line.  The anvil was placed and the pursestring tied.  A small amount of fat was cleared from the planned anastomosis and no diverticula were apparent within this.  This was placed back into the abdomen and a cap placed over the wound protector port site.  Pneumoperitoneum was reestablished.  I then went below to pass the stapler.  My partner remained above.  EEA sizers were cautiously passed trans-anally under direct visualization.  The stapler was passed and the spike deployed just anterior to the staple line.  The components were then mated.  Orientation was confirmed such that there is no twisting of the colon nor small bowel underneath the mesenteric defect. Care was taken to ensure no other structures were incorporated within this either.  The stapler was then closed, held, and fired. This was then removed. The donuts were inspected and noted to be complete.  The colon proximal to the anastomosis was then gently occluded. The pelvis was filled with sterile irrigation. Under direct visualization, I passed a flexible sigmoidoscope.  The anastomosis was under water.  With good distention of the anastomosis there was no air leak. The anastomosis pink in appearance.  This is located at 14 cm from the anal verge by flexible sigmoidoscopy.  It is hemostatic.  Additionally, looking from above, there is no tension on the colon or mesentery.  Sigmoidoscope was withdrawn.  Irrigation was evacuated from the pelvis.  The abdomen and pelvis are surveyed and noted to be completely hemostatic  without any apparent injury.  Under direct visualization, all trochars are removed.  The [place] wound protector was removed.  Gowns/gloves are changed and a fresh set of clean instruments utilized. Additional sterile drapes were placed around the field.   Sponge, needle, and instrument counts were reported correct.  The Pfannenstiel peritoneum was closed with a running 2-0 Vicryl suture.  The rectus fascia was then closed using 2 running #1 PDS sutures.  The fascia was then palpated and noted to be completely closed.  Additional anesthetic was infiltrated at the Pfannenstiel site.  Sponge, needle, and instrument counts were then again reported correct. 4-0 Monocryl subcuticular suture was used to close the skin of all incision sites.  Dermabond was placed over all incisions.  A honeycomb dressing placed over the Pfannenstiel as well.   He is taken out of lithotomy, awakened from anesthesia, extubated, and transferred to a stretcher for transport to  PACU in satisfactory condition having tolerated the procedure well.

## 2021-08-19 NOTE — H&P (Addendum)
CC: here today for surgery  HPI: Phillip [name] is an 60 y.o. male with history of HTN, stable aortic root dilation , whom is seen today as an office consultation at the request of Dr. Gloriann Loan for evaluation of possible colovesical fistula.  He has a history of recurrent urinary tract infections and dysuria. He has a history of urine culture as per Dr. Gloriann Loan showing pansensitive E. Coli.  CT A/P with Dr. Gloriann Loan 04/22/2021 with contrast demonstrated an air bubble in the bladder and thickening adjacent to the sigmoid colon. This was interpreted as being consistent with a possible colovesical fistula. He also had a cystoscopy completed with Dr. Gloriann Loan and by report had no masses but some redness in dome consistent with hx of colovesical fistula.  Colonoscopy with Dr. Loletha Carrow 05/15/21 demonstrated diverticulosis particularly in the left and right colon. At the rectosigmoid junction, there was mild patchy erythema which had a polypoid appearance.  He was referred to [place] for further evaluation. He reports intermittent symptoms of some degree of dysuria but denies any history of pneumaturia. He is not aware if he is ever had any episodes of diverticulitis but has had some vague abdominal like symptoms and pains that would last for many hours to a couple of days. He denies any blood in his stool. He is currently finishing a course of Keflex for his prior UTI.  He denies any changes in his health or health hx since we met in the office. Tolerated bowel prep with adequate result. Took the abx as well.   Social Hx: Denies use of tobacco; occasional etoh use; he works in Psychologist, occupational; his wife is a Marine scientist whom works in Occupational hygienist at [employer].  PSH: He denies any prior abdominal or pelvic surgical history  Past Medical History:  Diagnosis Date   Allergy    Arthritis    Asthma    Followed by Pulmonology   CAD (coronary artery disease)    Chicken pox    Fistula    GERD  (gastroesophageal reflux disease)    Hyperlipidemia    Hypertension    Thoracic aortic aneurysm ([place])     Past Surgical History:  Procedure Laterality Date   COLONOSCOPY     CYSTOSCOPY     to check bladder   DENTAL SURGERY     ESOPHAGOGASTRODUODENOSCOPY     LEFT HEART CATH AND CORONARY ANGIOGRAPHY N/A 12/06/2018   Procedure: LEFT HEART CATH AND CORONARY ANGIOGRAPHY;  Surgeon: Burnell Blanks, MD;  Location: [facility];  Service: Cardiovascular;  Laterality: N/A;   LEG SURGERY Left 2007   MASTECTOMY     WISDOM TOOTH EXTRACTION      Family History  Problem Relation Age of Onset   Hypertension Mother        Controlled with diet   Aortic aneurysm Mother    Hiatal hernia Father    Emphysema Maternal Uncle    Esophageal cancer Paternal Grandmother    Hodgkin's lymphoma Child        Remission   Stomach cancer Other    Rectal cancer Other    Colon cancer Other    Colon polyps Neg Hx     Social:  reports that he has never smoked. He has never used smokeless tobacco. He reports current alcohol use. He reports that he does not use drugs.  Allergies:  Allergies  Allergen Reactions   Bactrim [Sulfamethoxazole-Trimethoprim] Rash    Joint pain  Penicillins Rash    DID THE REACTION INVOLVE: Swelling of the face/tongue/throat, SOB, or low BP? No Sudden or severe rash/hives, skin peeling, or the inside of the mouth or nose? No Did it require medical treatment? No When did it last happen? Childhood allergy If all above answers are "NO", may proceed with cephalosporin use.     Medications: I have reviewed the patient's current medications.  Results for orders placed or performed during the hospital encounter of 08/19/21 (from the past 48 hour(s))  Type and screen     Status: None   Collection Time: 08/19/21  6:59 AM  Result Value Ref Range   ABO/RH(D) B POS    Antibody Screen NEG    Sample Expiration      08/22/2021,2359 Performed at [facility] [address], [place], [place] [address]   ABO/Rh     Status: None   Collection Time: 08/19/21  7:00 AM  Result Value Ref Range   ABO/RH(D)      B POS Performed at [facility], [place] [address] [place], [place] [address]     No results found.  ROS - all of the below systems have been reviewed with the patient and positives are indicated with bold text General: chills, fever or night sweats Eyes: blurry vision or double vision ENT: epistaxis or sore throat Allergy/Immunology: itchy/watery eyes or nasal congestion Hematologic/Lymphatic: bleeding problems, blood clots or swollen lymph nodes Endocrine: temperature intolerance or unexpected weight changes Breast: new or changing breast lumps or nipple discharge Resp: cough, shortness of breath, or wheezing CV: chest pain or dyspnea on exertion GI: as per HPI GU: dysuria, trouble voiding, or hematuria MSK: joint pain or joint stiffness Neuro: TIA or stroke symptoms Derm: pruritus and skin lesion changes Psych: anxiety and depression  PE Blood pressure (!) 148/91, pulse 81, temperature 97.6 F (36.4 C), temperature source Oral, resp. rate 18, height 6' (1.829 m), weight 104.3 kg, SpO2 96 %. Constitutional: NAD; conversant Eyes: Moist conjunctiva; no lid lag Lungs: Normal respiratory effort CV: RRR; no pitting edema GI: Abd soft, NT/ND; no palpable hepatosplenomegaly MSK: Normal range of motion of extremities Psychiatric: Appropriate affect; alert and oriented x3   Results for orders placed or performed during the hospital encounter of 08/19/21 (from the past 48 hour(s))  Type and screen     Status: None   Collection Time: 08/19/21  6:59 AM  Result Value Ref Range   ABO/RH(D) B POS    Antibody Screen NEG    Sample Expiration      08/22/2021,2359 Performed at [facility] [address] [place], [place] [address]   ABO/Rh     Status: None   Collection Time: 08/19/21   7:00 AM  Result Value Ref Range   ABO/RH(D)      B POS Performed at [facility], [place] [address] [place], [place] [address]     No results found.   A/P: Mr. Cowdrey is a very pleasant 28yoM with hx HTN here for surgery to address his colovesical fistula  -The anatomy and physiology of the GI tract was discussed at length with the patient again today. The pathophysiology of diverticulitis and colovesical fistula was reviewed as well. -We have discussed options going forward including further observation versus surgery. We discussed that with further observation, he would be at risk for recurrent urinary tract infections based on his imaging to date. We discussed these can be severe in  some cases as well. We also discussed surgery-robotic assisted sigmoidectomy/low anterior resection; flexible sigmoidoscopy; possible bladder repair. -The planned procedures, material risks (including, but not limited to, pain, bleeding, infection, scarring, need for blood transfusion, damage to surrounding structures- blood vessels/nerves/viscus/organs, damage to ureter, urine leak, leak from anastomosis, need for additional procedures, need for stoma which may be permanent, recurrence of diverticulitis and/or colovesical fistula, worsening of pre-existing medical conditions, hernia, recurrence, pneumonia, heart attack, stroke, death) benefits and alternatives to surgery were discussed at length. We noted a good probability that the procedure would help improve his symptoms and reduce his risk of additional urinary tract infections. The patient's questions were answered to his satisfaction, he voiced understanding and elected to proceed with surgery. Additionally, we discussed typical postoperative expectations and the recovery process.  Nadeen Landau, MD [facility] Surgery Use [email or web address] to contact on call provider

## 2021-08-20 ENCOUNTER — Encounter (HOSPITAL_COMMUNITY): Payer: Self-pay | Admitting: Surgery

## 2021-08-20 LAB — CBC
HCT: 43.8 % (ref 39.0–52.0)
Hemoglobin: 14.8 g/dL (ref 13.0–17.0)
MCH: 30.3 pg (ref 26.0–34.0)
MCHC: 33.8 g/dL (ref 30.0–36.0)
MCV: 89.6 fL (ref 80.0–100.0)
Platelets: 230 10*3/uL (ref 150–400)
RBC: 4.89 MIL/uL (ref 4.22–5.81)
RDW: 12.4 % (ref 11.5–15.5)
WBC: 13.9 10*3/uL — ABNORMAL HIGH (ref 4.0–10.5)
nRBC: 0 % (ref 0.0–0.2)

## 2021-08-20 LAB — BASIC METABOLIC PANEL
Anion gap: 8 (ref 5–15)
BUN: 11 mg/dL (ref 6–20)
CO2: 24 mmol/L (ref 22–32)
Calcium: 9.3 mg/dL (ref 8.9–10.3)
Chloride: 105 mmol/L (ref 98–111)
Creatinine, Ser: 0.78 mg/dL (ref 0.61–1.24)
GFR, Estimated: >60 mL/min (ref >60)
Glucose, Bld: 138 mg/dL — ABNORMAL HIGH (ref 70–99)
Potassium: 4 mmol/L (ref 3.5–5.1)
Sodium: 137 mmol/L (ref 135–145)

## 2021-08-20 MED ORDER — CHLORHEXIDINE GLUCONATE CLOTH 2 % EX PADS
6.0000 | MEDICATED_PAD | Freq: Every day | CUTANEOUS | Status: DC
  Administered 2021-08-20: 6 via TOPICAL

## 2021-08-20 NOTE — Plan of Care (Signed)
  Problem: Clinical Measurements: Goal: Ability to maintain clinical measurements within normal limits will improve Outcome: Progressing   Problem: Clinical Measurements: Goal: Postoperative complications will be avoided or minimized Outcome: Progressing   Problem: Skin Integrity: Goal: Demonstration of wound healing without infection will improve Outcome: Progressing

## 2021-08-21 LAB — CBC
HCT: 41.5 % (ref 39.0–52.0)
Hemoglobin: 13.8 g/dL (ref 13.0–17.0)
MCH: 30.4 pg (ref 26.0–34.0)
MCHC: 33.3 g/dL (ref 30.0–36.0)
MCV: 91.4 fL (ref 80.0–100.0)
Platelets: 200 10*3/uL (ref 150–400)
RBC: 4.54 MIL/uL (ref 4.22–5.81)
RDW: 12.8 % (ref 11.5–15.5)
WBC: 10.6 10*3/uL — ABNORMAL HIGH (ref 4.0–10.5)
nRBC: 0 % (ref 0.0–0.2)

## 2021-08-21 LAB — BASIC METABOLIC PANEL
Anion gap: 7 (ref 5–15)
BUN: 14 mg/dL (ref 6–20)
CO2: 28 mmol/L (ref 22–32)
Calcium: 9 mg/dL (ref 8.9–10.3)
Chloride: 104 mmol/L (ref 98–111)
Creatinine, Ser: 0.93 mg/dL (ref 0.61–1.24)
GFR, Estimated: >60 mL/min (ref >60)
Glucose, Bld: 108 mg/dL — ABNORMAL HIGH (ref 70–99)
Potassium: 3.6 mmol/L (ref 3.5–5.1)
Sodium: 139 mmol/L (ref 135–145)

## 2021-08-21 LAB — SURGICAL PATHOLOGY

## 2021-08-21 MED ORDER — TRAMADOL HCL 50 MG PO TABS
50.0000 mg | ORAL_TABLET | Freq: Four times a day (QID) | ORAL | 0 refills | Status: AC | PRN
Start: 2021-08-21 — End: 2021-08-26

## 2021-08-21 NOTE — Progress Notes (Signed)
Pharmacy Brief Note - Alvimopan (Entereg)  The standing order set for alvimopan (Entereg) now includes an automatic order to discontinue the drug after the patient has had a bowel movement. The change was approved by the [place] and the Medical Executive Committee.   This patient has had bowel movements documented by nursing. Therefore, alvimopan has been discontinued. If there are questions, please contact the pharmacy at [phone].   Thank you-   Royetta Asal, PharmD, BCPS 08/21/2021 11:51 AM

## 2021-08-21 NOTE — Progress Notes (Signed)
*  Late entry from 9/22 Subjective No acute events. Feeling well, up in chair; pain well controlled. No nausea or vomiting  Objective: Vital signs in last 24 hours: Temp:  [98 F (36.7 C)-98.3 F (36.8 C)] 98 F (36.7 C) (09/23 0544) Pulse Rate:  [58-69] 58 (09/23 0544) Resp:  [16-18] 16 (09/23 0544) BP: (111-123)/(66-79) 123/79 (09/23 0544) SpO2:  [95 %-96 %] 96 % (09/23 0832) Weight:  [102.3 kg] 102.3 kg (09/23 0500) Last BM Date: 08/21/21  Intake/Output from previous day: 09/22 0701 - 09/23 0700 In: 960 [P.O.:960] Out: 3500 [Urine:3500] Intake/Output this shift: Total I/O In: -  Out: 1100 [Urine:1100]  Gen: NAD, comfortable CV: RRR Pulm: Normal work of breathing Abd: Soft, appropriately tender, nondistended; incisions c/d/I  Ext: SCDs in place  Lab Results: CBC  Recent Labs    08/20/21 0409 08/21/21 0402  WBC 13.9* 10.6*  HGB 14.8 13.8  HCT 43.8 41.5  PLT 230 200   BMET Recent Labs    08/20/21 0409 08/21/21 0402  NA 137 139  K 4.0 3.6  CL 105 104  CO2 24 28  GLUCOSE 138* 108*  BUN 11 14  CREATININE 0.78 0.93  CALCIUM 9.3 9.0   PT/INR No results for input(s): LABPROT, INR in the last 72 hours. ABG No results for input(s): PHART, HCO3 in the last 72 hours.  Invalid input(s): PCO2, PO2  Studies/Results:  Anti-infectives: Anti-infectives (From admission, onward)    Start     Dose/Rate Route Frequency Ordered Stop   08/19/21 1400  neomycin (MYCIFRADIN) tablet 1,000 mg  Status:  Discontinued       See Hyperspace for full Linked Orders Report.   1,000 mg Oral 3 times per day 08/19/21 0629 08/19/21 1254   08/19/21 1400  metroNIDAZOLE (FLAGYL) tablet 1,000 mg  Status:  Discontinued       See Hyperspace for full Linked Orders Report.   1,000 mg Oral 3 times per day 08/19/21 0629 08/19/21 1254   08/19/21 0630  cefoTEtan (CEFOTAN) 2 g in sodium chloride 0.9 % 100 mL IVPB        2 g 200 mL/hr over 30 Minutes Intravenous On call to O.R. 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/12/2015   Allergic rhinitis 03/17/2015   Moderate persistent asthma in adult without complication 56/21/3086   s/p Procedure(s): ROBOTIC TAKEDOWN OF COLOVESICAL FISTULA WITH BILATERAL TAP BLOCK AND INTRAOPERATIVE ASSESSMENT OF PERFUSION USING FIREFLY ROBOTIC LOW ANTERIOR BOWEL RESECTION FLEXIBLE SIGMOIDOSCOPY 08/19/2021  -Doing quite well -Advancing diet as tolerated; having spontaneous bowel function -Ambulate 5x/day -Foley out tomorrow -Spent time reviewing his procedure, findings and plans moving forward -Ppx: SQH, SCDs  LOS: 2 days   Nadeen Landau, MD [facility] Surgery Use [email or web address] to contact on call provider

## 2021-08-21 NOTE — Progress Notes (Signed)
Reviewed written d/c instructions w pt and all questions answered. Pt verbalized understanding. D/C per w/c w all belongings in stable condition. 

## 2021-08-21 NOTE — Discharge Summary (Signed)
Patient ID: Phillip [name] MRN: [id number] DOB/AGE: September 23, 1961 60 y.o.  Admit date: 08/19/2021 Discharge date: 08/21/2021  Discharge Diagnoses Patient Active Problem List   Diagnosis Date Noted   S/P laparoscopic-assisted sigmoidectomy 08/19/2021   Coronary artery disease involving native coronary artery of native heart without angina pectoris    Essential hypertension 06/23/2018   Visit for preventive health examination 12/12/2015   Prostate cancer screening 12/12/2015   Screening for HIV without presence of risk factors 12/12/2015   Tennis elbow syndrome 12/12/2015   Allergic rhinitis 03/17/2015   Moderate persistent asthma in adult without complication 16/05/3709    Consultants None  Procedures OR 9/21 PROCEDURE:  Robotic low anterior resection with colorectal anastomosis Intraoperative assessment of perfusion (ICG) Flexible sigmoidoscopy Bilateral transversus abdominus plane blocks    Hospital Course: He was admitted postoperatively where he recovered without issue. On POD#1 he was having spontaneous bowel function and his diet advanced. He tolerated this without issue. He was mobilizing well on his own and pain controlled on oral analgesics. On POD#2, his foley was removed and he was able to void on his own without difficulty. He is comfortable with and stable for discharge home.  VSS AF NAD, comfortable RRR Abdomen is soft, NT/ND; incisions c/d/I without erythema    Allergies as of 08/21/2021       Reactions   Bactrim [sulfamethoxazole-trimethoprim] Rash   Joint pain   Penicillins Rash   DID THE REACTION INVOLVE: Swelling of the face/tongue/throat, SOB, or low BP? No Sudden or severe rash/hives, skin peeling, or the inside of the mouth or nose? No Did it require medical treatment? No When did it last happen? Childhood allergy If all above answers are "NO", may proceed with cephalosporin use.        Medication List     TAKE these medications     albuterol 108 (90 Base) MCG/ACT inhaler Commonly known as: Ventolin HFA Inhale 2 puffs into the lungs every 6 (six) hours as needed.   amLODipine 5 MG tablet Commonly known as: NORVASC Take 1 tablet (5 mg total) by mouth daily.   aspirin EC 81 MG tablet Take 1 tablet (81 mg total) by mouth daily.   Cephalexin 250 MG tablet Take 250 mg by mouth at bedtime.   cetirizine 10 MG tablet Commonly known as: ZYRTEC Take 10 mg by mouth daily.   metoprolol succinate 25 MG 24 hr tablet Commonly known as: TOPROL-XL TAKE 1 TABLET DAILY   Omeprazole 20 MG Tbec Take 20 mg by mouth every other day.   Qvar RediHaler 80 MCG/ACT inhaler Generic drug: beclomethasone TAKE 2 PUFFS BY MOUTH TWICE A DAY   rosuvastatin 40 MG tablet Commonly known as: CRESTOR Take 1 tablet (40 mg total) by mouth daily.   sildenafil 100 MG tablet Commonly known as: VIAGRA TAKE 1 TABLET BY MOUTH DAILY AS NEEDED FOR ERECTILE DYSFUNCTION   tamsulosin 0.4 MG Caps capsule Commonly known as: FLOMAX Take 0.4 mg by mouth daily.   traMADol 50 MG tablet Commonly known as: Ultram Take 1 tablet (50 mg total) by mouth every 6 (six) hours as needed for up to 5 days (postop pain not controlled with tylenol and ibuprofen).          Follow-up Information     Ileana Roup, MD Follow up in 2 week(s).   Specialties: General Surgery, Colon and Rectal Surgery Contact information: [address] [place] [address] [phone]  Sharon Mt. Dema Severin, M.D. [facility]

## 2021-08-21 NOTE — Discharge Instructions (Signed)
POST OP INSTRUCTIONS AFTER COLON SURGERY  DIET: Be sure to include lots of fluids daily to stay hydrated - 64oz of water per day (8, 8 oz glasses).  Avoid fast food or heavy meals for the first couple of weeks as your are more likely to get nauseated. Avoid raw/uncooked fruits or vegetables for the first 4 weeks (its ok to have these if they are blended into smoothie form). If you have fruits/vegetables, make sure they are cooked until soft enough to mash on the roof of your mouth and chew your food well. Otherwise, diet as tolerated.  Take your usually prescribed home medications unless otherwise directed.  PAIN CONTROL: Pain is best controlled by a usual combination of three different methods TOGETHER: Ice/Heat Over the counter pain medication Prescription pain medication Most patients will experience some swelling and bruising around the surgical site.  Ice packs or heating pads (30-60 minutes up to 6 times a day) will help. Some people prefer to use ice alone, heat alone, alternating between ice & heat.  Experiment to what works for you.  Swelling and bruising can take several weeks to resolve.   It is helpful to take an over-the-counter pain medication regularly for the first few weeks: Ibuprofen (Motrin/Advil) - 200mg tabs - take 3 tabs (600mg) every 6 hours as needed for pain (unless you have been directed previously to avoid NSAIDs/ibuprofen) Acetaminophen (Tylenol) - you may take 650mg every 6 hours as needed. You can take this with motrin as they act differently on the body. If you are taking a narcotic pain medication that has acetaminophen in it, do not take over the counter tylenol at the same time. NOTE: You may take both of these medications together - most patients  find it most helpful when alternating between the two (i.e. Ibuprofen at 6am, tylenol at 9am, ibuprofen at 12pm ...) A  prescription for pain medication should be given to you upon discharge.  Take your pain medication as  prescribed if your pain is not adequatly controlled with the over-the-counter pain reliefs mentioned above.  Avoid getting constipated.  Between the surgery and the pain medications, it is common to experience some constipation.  Increasing fluid intake and taking a fiber supplement (such as Metamucil, Citrucel, FiberCon, MiraLax, etc) 1-2 times a day regularly will usually help prevent this problem from occurring.  A mild laxative (prune juice, Milk of Magnesia, MiraLax, etc) should be taken according to package directions if there are no bowel movements after 48 hours.    Dressing: Your incisions are covered in Dermabond which is like sterile superglue for the skin. This will come off on it's own in a couple weeks. It is waterproof and you may bathe normally starting the day after your surgery in a shower. Avoid baths/pools/lakes/oceans until your wounds have fully healed.  ACTIVITIES as tolerated:   Avoid heavy lifting (>10lbs or 1 gallon of milk) for the next 6 weeks. You may resume regular daily activities as tolerated--such as daily self-care, walking, climbing stairs--gradually increasing activities as tolerated.  If you can walk 30 minutes without difficulty, it is safe to try more intense activity such as jogging, treadmill, bicycling, low-impact aerobics.  DO NOT PUSH THROUGH PAIN.  Let pain be your guide: If it hurts to do something, don't do it. You may drive when you are no longer taking prescription pain medication, you can comfortably wear a seatbelt, and you can safely maneuver your car and apply brakes.  FOLLOW UP in our   office Please call [facility] at [phone] to set up an appointment to see your surgeon in the office for a follow-up appointment approximately 2 weeks after your surgery. Make sure that you call for this appointment the day you arrive home to insure a convenient appointment time.  9. If you have disability or family leave forms that need to be completed, you may have  them completed by your primary care physician's office; for return to work instructions, please ask our office staff and they will be happy to assist you in obtaining this documentation   When to call us [phone]: Poor pain control Reactions / problems with new medications (rash/itching, etc)  Fever over 101.5 F (38.5 C) Inability to urinate Nausea/vomiting Worsening swelling or bruising Continued bleeding from incision. Increased pain, redness, or drainage from the incision  The clinic staff is available to answer your questions during regular business hours (8:30am-5pm).  Please don't hesitate to call and ask to speak to one of our nurses for clinical concerns.   A surgeon from [facility] is always on call at the hospitals   If you have a medical emergency, go to the nearest emergency room or call 911.  [facility] [address], [address], [place], [place]  [address] MAIN: [phone] FAX: [phone] [email or web address]  

## 2021-08-27 ENCOUNTER — Telehealth: Payer: Self-pay

## 2021-08-27 NOTE — Telephone Encounter (Signed)
Transition Care Management Follow-up Telephone Call Date of discharge and from where: 08/21/21 from [facility] How have you been since you were released from the hospital? "Fantastic, better than I could have ever expected" Any questions or concerns? No  Items Reviewed: Did the pt receive and understand the discharge instructions provided? Yes  Medications obtained and verified? Yes  Other? No  Any new allergies since your discharge? No  Dietary orders reviewed? Yes Do you have support at home? Yes   Home Care and Equipment/Supplies: Were home health services ordered? no If so, what is the name of the agency? Not applicable  Has the agency set up a time to come to the patient's home? not applicable Were any new equipment or medical supplies ordered?  No What is the name of the medical supply agency? Not applicable Were you able to get the supplies/equipment? not applicable Do you have any questions related to the use of the equipment or supplies? No  Functional Questionnaire: (I = Independent and D = Dependent) ADLs: I  Bathing/Dressing- I  Meal Prep- I  Eating- I  Maintaining continence- I  Transferring/Ambulation- I  Managing Meds- I  Follow up appointments reviewed:  PCP Hospital f/u appt confirmed? No  Specialist Hospital f/u appt confirmed? Yes  Scheduled to see Dr. Dema Severin on 09/16/21 @ 9:00 am . Are transportation arrangements needed? No  If their condition worsens, is the pt aware to call PCP or go to the Emergency Dept.? Yes Was the patient provided with contact information for the PCP's office or ED? Yes Was to pt encouraged to call back with questions or concerns? Yes

## 2021-09-05 ENCOUNTER — Other Ambulatory Visit: Payer: Self-pay | Admitting: Cardiovascular Disease

## 2021-09-07 NOTE — Telephone Encounter (Signed)
Pt's pharmacy is requesting a refill on sildenafil. Would Dr. Angelena Form like to refill this medication? Please address

## 2021-10-08 ENCOUNTER — Other Ambulatory Visit: Payer: Self-pay | Admitting: Pulmonary Disease

## 2021-10-08 MED ORDER — FLUTICASONE PROPIONATE HFA 110 MCG/ACT IN AERO: 2.0000 | INHALATION_SPRAY | Freq: Two times a day (BID) | RESPIRATORY_TRACT | 5 refills | Status: DC

## 2021-10-08 NOTE — Progress Notes (Unsigned)
Flovent prescription sent in to pharmacy

## 2021-10-08 NOTE — Telephone Encounter (Signed)
AO please advise.    Pt was last seen 11/21 and he does not have a pending appt.

## 2021-10-08 NOTE — Telephone Encounter (Signed)
Prescription for Flovent sent into pharmacy

## 2021-11-02 ENCOUNTER — Other Ambulatory Visit: Payer: Self-pay | Admitting: Cardiovascular Disease

## 2021-11-28 ENCOUNTER — Other Ambulatory Visit: Payer: Self-pay | Admitting: Cardiovascular Disease

## 2022-02-01 ENCOUNTER — Other Ambulatory Visit: Payer: Self-pay | Admitting: Cardiovascular Disease

## 2022-03-10 ENCOUNTER — Other Ambulatory Visit: Payer: Self-pay | Admitting: Surgery

## 2022-03-10 DIAGNOSIS — I7121 Aneurysm of the ascending aorta, without rupture: Secondary | ICD-10-CM

## 2022-03-15 ENCOUNTER — Ambulatory Visit: Payer: 59 | Admitting: Cardiovascular Disease

## 2022-03-15 ENCOUNTER — Encounter: Payer: Self-pay | Admitting: Cardiovascular Disease

## 2022-03-15 VITALS — BP 132/82 | HR 87 | Ht 72.0 in | Wt 233.2 lb

## 2022-03-15 DIAGNOSIS — I2510 Atherosclerotic heart disease of native coronary artery without angina pectoris: Secondary | ICD-10-CM | POA: Diagnosis not present

## 2022-03-15 DIAGNOSIS — E7800 Pure hypercholesterolemia, unspecified: Secondary | ICD-10-CM | POA: Diagnosis not present

## 2022-03-15 DIAGNOSIS — I712 Thoracic aortic aneurysm, without rupture, unspecified: Secondary | ICD-10-CM | POA: Diagnosis not present

## 2022-03-15 LAB — HEPATIC FUNCTION PANEL
ALT: 29 IU/L (ref 0–44)
AST: 25 IU/L (ref 0–40)
Albumin: 4.4 g/dL (ref 3.8–4.9)
Alkaline Phosphatase: 59 IU/L (ref 44–121)
Bilirubin Total: 0.5 mg/dL (ref 0.0–1.2)
Bilirubin, Direct: 0.15 mg/dL (ref 0.00–0.40)
Total Protein: 6.6 g/dL (ref 6.0–8.5)

## 2022-03-15 LAB — LIPID PANEL
Chol/HDL Ratio: 3 ratio (ref 0.0–5.0)
Cholesterol, Total: 123 mg/dL (ref 100–199)
HDL: 41 mg/dL (ref >39)
LDL Chol Calc (NIH): 67 mg/dL (ref 0–99)
Triglycerides: 71 mg/dL (ref 0–149)
VLDL Cholesterol Cal: 15 mg/dL (ref 5–40)

## 2022-03-15 NOTE — Patient Instructions (Signed)
Medication Instructions:  ?No changes today. ?*If you need a refill on your cardiac medications before your next appointment, please call your pharmacy* ? ? ?Lab Work: ?Today: lipids/liver function ?If you have labs (blood work) drawn today and your tests are completely normal, you will receive your results only by: ?MyChart Message (if you have MyChart) OR ?A paper copy in the mail ?If you have any lab test that is abnormal or we need to change your treatment, we will call you to review the results. ? ? ?Testing/Procedures: ?none ? ? ?Follow-Up: ?At [facility], you and your health needs are our priority.  As part of our continuing mission to provide you with exceptional heart care, we have created designated Provider Care Teams.  These Care Teams include your primary Cardiologist (physician) and Advanced Practice Providers (APPs -  Physician Assistants and Nurse Practitioners) who all work together to provide you with the care you need, when you need it. ? ?Your next appointment:   ?12 month(s) ? ?The format for your next appointment:   ?In Person ? ?Provider:   ?[name], MD   ? ? ?Important Information About Sugar ? ? ? ? ?  ?

## 2022-03-15 NOTE — Progress Notes (Signed)
? ?Chief Complaint  ?Patient presents with  ? Follow-up  ?  CAD  ? ?History of Present Illness: 61 yo male with history of ascending thoracic aortic aneurysm, CAD, GERD, hyperlipidemia and asthma here today for follow up.  He was seen in the ED in June 2019 with chest pain. Chest CTA without evidence of PE but his aortic root was noted to be dilated at 5.2 cm. The chest CTA also showed calcification in the left main. Gated cardiac CT December 2019 with possible flow limiting lesion in the mid LAD. Cardiac cath 12/06/18 with moderate disease in the small mid LAD and mild disease in the RCA and Circumflex. Chest CTA May 2022 showed a stable 5.0 cm aneurysm of the ascending aorta at the level of the sinuses of valsalva. He is now being followed in our CT surgery office by Dr. Cyndia Bent. Echo October 2019 with normal LV size and function, no valve disease. His wife is a Marine scientist in our CVICU.  ? ?He is here today for follow up. The patient denies any chest pain, dyspnea, palpitations, lower extremity edema, orthopnea, PND, dizziness, near syncope or syncope.  ? ?Primary Care Physician: [name], DO ? ?Past Medical History:  ?Diagnosis Date  ? Allergy   ? Arthritis   ? Asthma   ? Followed by Pulmonology  ? CAD (coronary artery disease)   ? Chicken pox   ? Fistula   ? GERD (gastroesophageal reflux disease)   ? Hyperlipidemia   ? Hypertension   ? Thoracic aortic aneurysm ([place])   ? ? ?Past Surgical History:  ?Procedure Laterality Date  ? BOWEL RESECTION N/A 08/19/2021  ? Procedure: ROBOTIC LOW ANTERIOR BOWEL RESECTION;  Surgeon: Ileana Roup, MD;  Location: [facility];  Service: General;  Laterality: N/A;  ? COLONOSCOPY    ? CYSTOSCOPY    ? to check bladder  ? DENTAL SURGERY    ? ESOPHAGOGASTRODUODENOSCOPY    ? FLEXIBLE SIGMOIDOSCOPY N/A 08/19/2021  ? Procedure: FLEXIBLE SIGMOIDOSCOPY;  Surgeon: Ileana Roup, MD;  Location: [facility];  Service: General;  Laterality: N/A;  ? LEFT HEART CATH AND CORONARY  ANGIOGRAPHY N/A 12/06/2018  ? Procedure: LEFT HEART CATH AND CORONARY ANGIOGRAPHY;  Surgeon: Burnell Blanks, MD;  Location: [facility];  Service: Cardiovascular;  Laterality: N/A;  ? LEG SURGERY Left 2007  ? MASTECTOMY    ? WISDOM TOOTH EXTRACTION    ? ? ?Current Outpatient Medications  ?Medication Sig Dispense Refill  ? albuterol (VENTOLIN HFA) 108 (90 Base) MCG/ACT inhaler Inhale 2 puffs into the lungs every 6 (six) hours as needed. 18 g 4  ? amLODipine (NORVASC) 5 MG tablet Take 1 tablet (5 mg total) by mouth daily. Please keep upcoming appt in April 2023 with Dr. Angelena Form before anymore refills. Thank you Final Attempt 30 tablet 1  ? aspirin EC 81 MG tablet Take 1 tablet (81 mg total) by mouth daily. 90 tablet 3  ? cetirizine (ZYRTEC) 10 MG tablet Take 10 mg by mouth daily.    ? fluticasone (FLOVENT HFA) 110 MCG/ACT inhaler Inhale 2 puffs into the lungs 2 (two) times daily. 1 each 5  ? metoprolol succinate (TOPROL-XL) 25 MG 24 hr tablet TAKE 1 TABLET DAILY 90 tablet 3  ? Omeprazole 20 MG TBEC Take 20 mg by mouth every other day.    ? rosuvastatin (CRESTOR) 40 MG tablet TAKE 1 TABLET DAILY (DOSE  INCREASE) 90 tablet 1  ? sildenafil (VIAGRA) 100 MG tablet  TAKE ONE TABLET BY MOUTH ONE TIME DAILY AS NEEDED FOR ERECTILE DYSFUNCTION 10 tablet 0  ? ?Current Facility-Administered Medications  ?Medication Dose Route Frequency Provider Last Rate Last Admin  ? 0.9 %  sodium chloride infusion  500 mL Intravenous Once Doran Stabler, MD      ? ? ?Allergies  ?Allergen Reactions  ? Bactrim [Sulfamethoxazole-Trimethoprim] Rash  ?  Joint pain  ? Penicillins Rash  ?  DID THE REACTION INVOLVE: Swelling of the face/tongue/throat, SOB, or low BP? No ?Sudden or severe rash/hives, skin peeling, or the inside of the mouth or nose? No ?Did it require medical treatment? No ?When did it last happen? Childhood allergy ?If all above answers are ?NO?, may proceed with cephalosporin use. ?  ? ? ?Social History   ? ?Socioeconomic History  ? Marital status: Married  ?  Spouse name: Not on file  ? Number of children: 2  ? Years of education: Not on file  ? Highest education level: Not on file  ?Occupational History  ? Occupation: management health care  ?Tobacco Use  ? Smoking status: Never  ? Smokeless tobacco: Never  ?Vaping Use  ? Vaping Use: Never used  ?Substance and Sexual Activity  ? Alcohol use: Yes  ?  Alcohol/week: 0.0 standard drinks  ?  Comment: socially  ? Drug use: No  ? Sexual activity: Not on file  ?Other Topics Concern  ? Not on file  ?Social History Narrative  ? Not on file  ? ?Social Determinants of Health  ? ?Financial Resource Strain: Not on file  ?Food Insecurity: Not on file  ?Transportation Needs: Not on file  ?Physical Activity: Not on file  ?Stress: Not on file  ?Social Connections: Not on file  ?Intimate Partner Violence: Not on file  ? ? ?Family History  ?Problem Relation Age of Onset  ? Hypertension Mother   ?     Controlled with diet  ? Aortic aneurysm Mother   ? Hiatal hernia Father   ? Emphysema Maternal Uncle   ? Esophageal cancer Paternal Grandmother   ? Hodgkin's lymphoma Child   ?     Remission  ? Stomach cancer Other   ? Rectal cancer Other   ? Colon cancer Other   ? Colon polyps Neg Hx   ? ? ?Review of Systems:  As stated in the HPI and otherwise negative.  ? ?BP 132/82   Pulse 87   Ht 6' (1.829 m)   Wt 233 lb 3.2 oz (105.8 kg)   SpO2 98%   BMI 31.63 kg/m?  ? ?Physical Examination: ? ?General: Well developed, well nourished, NAD  ?HEENT: OP clear, mucus membranes moist  ?SKIN: warm, dry. No rashes. ?Neuro: No focal deficits  ?Musculoskeletal: Muscle strength 5/5 all ext  ?Psychiatric: Mood and affect normal  ?Neck: No JVD, no carotid bruits, no thyromegaly, no lymphadenopathy.  ?Lungs:Clear bilaterally, no wheezes, rhonci, crackles ?Cardiovascular: Regular rate and rhythm. No murmurs, gallops or rubs. ?Abdomen:Soft. Bowel sounds present. Non-tender.  ?Extremities: No lower extremity  edema. Pulses are 2 + in the bilateral DP/PT. ? ?EKG:  EKG is ordered today. ?The ekg ordered today demonstrates sinus ? ?Echo October 2019: ?- Left ventricle: The cavity size was normal. There was mild ?  concentric hypertrophy. Systolic function was normal. The ?  estimated ejection fraction was in the range of 50% to 55%. Wall ?  motion was normal; there were no regional wall motion ?  abnormalities. ?- Aorta: Aortic  root dimension: 48 mm (ED). Ascending aortic ?  diameter: 39 mm (S). ?- Aortic root: The aortic root was moderately dilated. ?- Ascending aorta: The ascending aorta was mildly dilated. ?- Mitral valve: Calcified annulus. There was trivial regurgitation. ?- Atrial septum: There was increased thickness of the septum, ?  consistent with lipomatous hypertrophy. ?- Tricuspid valve: There was trivial regurgitation. ? ?Recent Labs: ?07/31/2021: ALT 37 ?08/21/2021: BUN 14; Creatinine, Ser 0.93; Hemoglobin 13.8; Platelets 200; Potassium 3.6; Sodium 139  ? ?Lipid Panel ?   ?Component Value Date/Time  ? CHOL 171 09/22/2020 1035  ? TRIG 112 09/22/2020 1035  ? HDL 49 09/22/2020 1035  ? CHOLHDL 3.5 09/22/2020 1035  ? CHOLHDL 5 09/01/2017 0820  ? VLDL 27.0 09/01/2017 0820  ? LDLCALC 102 (H) 09/22/2020 1035  ? ?  ?Wt Readings from Last 3 Encounters:  ?03/15/22 233 lb 3.2 oz (105.8 kg)  ?08/21/21 225 lb 8.5 oz (102.3 kg)  ?07/31/21 230 lb (104.3 kg)  ?  ? ?Other studies Reviewed: ?Additional studies/ records that were reviewed today include: ED records, CT scan report ?Review of the above records demonstrates:  ? ? ?Assessment and Plan:  ? ?1. Thoracic aortic aneurysm: Chest CTA May 2022 with 5.0 cm dilated aortic root.  Followed by Dr. Cyndia Bent. BP is controlled.  ? ?2. CAD without angina: No chest pain. Continue ASA, statin and beta blocker.  ? ?3. Hyperlipidemia: Last LDL 102 in October 2021. No check since his Crestor was increased to 40 mg daily. Repeat lipids and LFTS today. Continue statin.  ? ?4. Erectile  dysfunction: Continue Viagra.  ? ?Current medicines are reviewed at length with the patient today.  The patient does not have concerns regarding medicines. ? ?The following changes have been made:  no change ? ?Labs/ tests ordered t

## 2022-03-17 ENCOUNTER — Telehealth: Payer: 59 | Admitting: Physician Assistant

## 2022-03-17 DIAGNOSIS — J310 Chronic rhinitis: Secondary | ICD-10-CM

## 2022-03-17 MED ORDER — BENZONATATE 100 MG PO CAPS: 100.0000 mg | ORAL_CAPSULE | Freq: Three times a day (TID) | ORAL | 0 refills | Status: DC | PRN

## 2022-03-17 MED ORDER — DOXYCYCLINE MONOHYDRATE 100 MG PO CAPS: 100.0000 mg | ORAL_CAPSULE | Freq: Two times a day (BID) | ORAL | 0 refills | Status: DC

## 2022-03-17 NOTE — Patient Instructions (Addendum)
?  [name], thank you for joining [name], PA-C for today's virtual visit.  While this provider is not your primary care provider (PCP), if your PCP is located in our provider database this encounter information will be shared with them immediately following your visit. ? ?Consent: ?(Patient) Phillip [name] provided verbal consent for this virtual visit at the beginning of the encounter. ? ?Current Medications: ? ?Current Outpatient Medications:  ?  albuterol (VENTOLIN HFA) 108 (90 Base) MCG/ACT inhaler, Inhale 2 puffs into the lungs every 6 (six) hours as needed., Disp: 18 g, Rfl: 4 ?  amLODipine (NORVASC) 5 MG tablet, Take 1 tablet (5 mg total) by mouth daily. Please keep upcoming appt in April 2023 with Dr. Angelena Form before anymore refills. Thank you Final Attempt, Disp: 30 tablet, Rfl: 1 ?  aspirin EC 81 MG tablet, Take 1 tablet (81 mg total) by mouth daily., Disp: 90 tablet, Rfl: 3 ?  cetirizine (ZYRTEC) 10 MG tablet, Take 10 mg by mouth daily., Disp: , Rfl:  ?  metoprolol succinate (TOPROL-XL) 25 MG 24 hr tablet, TAKE 1 TABLET DAILY, Disp: 90 tablet, Rfl: 3 ?  Omeprazole 20 MG TBEC, Take 20 mg by mouth every other day., Disp: , Rfl:  ?  rosuvastatin (CRESTOR) 40 MG tablet, TAKE 1 TABLET DAILY (DOSE  INCREASE), Disp: 90 tablet, Rfl: 1 ?  sildenafil (VIAGRA) 100 MG tablet, TAKE ONE TABLET BY MOUTH ONE TIME DAILY AS NEEDED FOR ERECTILE DYSFUNCTION, Disp: 10 tablet, Rfl: 0 ? ?Current Facility-Administered Medications:  ?  0.9 %  sodium chloride infusion, 500 mL, Intravenous, Once, Danis, Kirke Corin, MD  ? ?Medications ordered in this encounter:  ?No orders of the defined types were placed in this encounter. ?  ? ?*If you need refills on other medications prior to your next appointment, please contact your pharmacy* ? ?Follow-Up: ?Call back or seek an in-person evaluation if the symptoms worsen or if the condition fails to improve as anticipated. ? ?Other Instructions ?Please keep  hydrated and get plenty of rest. ?Stop the Allertec and start OTC Xyzal. ?Restart Flonase. ?You can use Mucinex-DM for cough and congestion. ?I have sent in a prescription for Tessalon to also help with cough. ? ?Again this seems like a mild viral deal on top of your chronic allergies that seems to be improving. If you note recurrence of sinus pain/ear pain or you note any fever, tooth pain, change in sputum, take the Doxycycline I have put on file at the pharmacy.  ? ? ?If you have been instructed to have an in-person evaluation today at a local Urgent Care facility, please use the link below. It will take you to a list of all of our available [facility], including address, phone number and hours of operation. Please do not delay care.  ?[facility] ? ?If you or a family member do not have a primary care provider, use the link below to schedule a visit and establish care. When you choose a [facility] primary care physician or advanced practice provider, you gain a long-term partner in health. ?Find a Primary Care Provider ? ?Learn more about [facility] in-office and virtual care options: ?[facility]  ?

## 2022-03-17 NOTE — Progress Notes (Signed)
?Virtual Visit Consent  ? ?Phillip [name], you are scheduled for a virtual visit with a [place] provider today.   ?  ?Just as with appointments in the office, your consent must be obtained to participate.  Your consent will be active for this visit and any virtual visit you may have with one of our providers in the next 365 days.   ?  ?If you have a MyChart account, a copy of this consent can be sent to you electronically.  All virtual visits are billed to your insurance company just like a traditional visit in the office.   ? ?As this is a virtual visit, video technology does not allow for your provider to perform a traditional examination.  This may limit your provider's ability to fully assess your condition.  If your provider identifies any concerns that need to be evaluated in person or the need to arrange testing (such as labs, EKG, etc.), we will make arrangements to do so.   ?  ?Although advances in technology are sophisticated, we cannot ensure that it will always work on either your end or our end.  If the connection with a video visit is poor, the visit may have to be switched to a telephone visit.  With either a video or telephone visit, we are not always able to ensure that we have a secure connection.    ? ?I need to obtain your verbal consent now.   Are you willing to proceed with your visit today?  ?  ?Phillip [name] has provided verbal consent on 03/17/2022 for a virtual visit (video or telephone). ?  ?Phillip Rio, PA-C  ? ?Date: 03/17/2022 10:58 AM ? ? ?Virtual Visit via Video Note  ? ?ILeeanne [name], connected with  Phillip [name]  ([id number], 1961-10-01) on 03/17/22 at 10:45 AM EDT by a video-enabled telemedicine application and verified that I am speaking with the correct person using two identifiers. ? ?Location: ?Patient: Virtual Visit Location Patient: Home ?Provider: Virtual Visit Location Provider: Home Office ?  ?I discussed the limitations of evaluation  and management by telemedicine and the availability of in person appointments. The patient expressed understanding and agreed to proceed.   ? ?History of Present Illness: ?Phillip [name] is a 61 y.o. who identifies as a male who was assigned male at birth, and is being seen today for environmental allergy symptoms. Has chronic rhinitis for which he takes an OTC Allertec daily. Notes he has been on this regimen for some time. Typically does well but has had some intermittent issue with sinus pressure, ear pressure and popping despite this. Started a Sudafed OTC as needed for when this happens which helps. Over past couple of days noting some ear pain, fatigue, aches and sinus pain but today seems better. Denies fever or chest congestion. Is noting a mild, dry cough which is affecting sleep though.  ? ? ?HPI: HPI  ?Problems:  ?Patient Active Problem List  ? Diagnosis Date Noted  ? S/P laparoscopic-assisted sigmoidectomy 08/19/2021  ? Coronary artery disease involving native coronary artery of native heart without angina pectoris   ? Essential hypertension 06/23/2018  ? Visit for preventive health examination 12/12/2015  ? Prostate cancer screening 12/12/2015  ? Screening for HIV without presence of risk factors 12/12/2015  ? Tennis elbow syndrome 12/12/2015  ? Allergic rhinitis 03/17/2015  ? Moderate persistent asthma in adult without complication 25/95/[id number]  ?  ?Allergies:  ?Allergies  ?Allergen Reactions  ?  Bactrim [Sulfamethoxazole-Trimethoprim] Rash  ?  Joint pain  ? Penicillins Rash  ?  DID THE REACTION INVOLVE: Swelling of the face/tongue/throat, SOB, or low BP? No ?Sudden or severe rash/hives, skin peeling, or the inside of the mouth or nose? No ?Did it require medical treatment? No ?When did it last happen? Childhood allergy ?If all above answers are ?NO?, may proceed with cephalosporin use. ?  ? ?Medications:  ?Current Outpatient Medications:  ?  benzonatate (TESSALON) 100 MG capsule, Take 1 capsule (100  mg total) by mouth 3 (three) times daily as needed for cough., Disp: 30 capsule, Rfl: 0 ?  doxycycline (MONODOX) 100 MG capsule, Take 1 capsule (100 mg total) by mouth 2 (two) times daily., Disp: 14 capsule, Rfl: 0 ?  albuterol (VENTOLIN HFA) 108 (90 Base) MCG/ACT inhaler, Inhale 2 puffs into the lungs every 6 (six) hours as needed., Disp: 18 g, Rfl: 4 ?  amLODipine (NORVASC) 5 MG tablet, Take 1 tablet (5 mg total) by mouth daily. Please keep upcoming appt in April 2023 with Dr. Angelena Form before anymore refills. Thank you Final Attempt, Disp: 30 tablet, Rfl: 1 ?  aspirin EC 81 MG tablet, Take 1 tablet (81 mg total) by mouth daily., Disp: 90 tablet, Rfl: 3 ?  cetirizine (ZYRTEC) 10 MG tablet, Take 10 mg by mouth daily., Disp: , Rfl:  ?  metoprolol succinate (TOPROL-XL) 25 MG 24 hr tablet, TAKE 1 TABLET DAILY, Disp: 90 tablet, Rfl: 3 ?  Omeprazole 20 MG TBEC, Take 20 mg by mouth every other day., Disp: , Rfl:  ?  rosuvastatin (CRESTOR) 40 MG tablet, TAKE 1 TABLET DAILY (DOSE  INCREASE), Disp: 90 tablet, Rfl: 1 ?  sildenafil (VIAGRA) 100 MG tablet, TAKE ONE TABLET BY MOUTH ONE TIME DAILY AS NEEDED FOR ERECTILE DYSFUNCTION, Disp: 10 tablet, Rfl: 0 ? ?Current Facility-Administered Medications:  ?  0.9 %  sodium chloride infusion, 500 mL, Intravenous, Once, Danis, Kirke Corin, MD ? ?Observations/Objective: ?Patient is well-developed, well-nourished in no acute distress.  ?Resting comfortably at home.  ?Head is normocephalic, atraumatic.  ?No labored breathing. ?Speech is clear and coherent with logical content.  ?Patient is alert and oriented at baseline.  ? ?Assessment and Plan: ?1. Chronic rhinitis ?- benzonatate (TESSALON) 100 MG capsule; Take 1 capsule (100 mg total) by mouth 3 (three) times daily as needed for cough.  Dispense: 30 capsule; Refill: 0 ? ?Breakthrough symptoms along with likely mild viral infection that is dissipating. Switch to Xzal OTC. Restart Flonase daily. Limit use of Sudafed. Mucinex-DM for cough  and congestion. Tessalon per orders. He is feeling better today so should continue to improve, especially if we get allergy symptoms under better control. Did put an antibiotic on file at the pharmacy to start if recurrence of aches, sinus pain, ear pain, or if change in sputum or new onset fever.  ? ?Follow Up Instructions: ?I discussed the assessment and treatment plan with the patient. The patient was provided an opportunity to ask questions and all were answered. The patient agreed with the plan and demonstrated an understanding of the instructions.  A copy of instructions were sent to the patient via MyChart unless otherwise noted below.  ? ?The patient was advised to call back or seek an in-person evaluation if the symptoms worsen or if the condition fails to improve as anticipated. ? ?Time:  ?I spent 15 minutes with the patient via telehealth technology discussing the above problems/concerns.   ? ?Phillip Rio, PA-C ?

## 2022-04-16 ENCOUNTER — Other Ambulatory Visit: Payer: Self-pay | Admitting: Cardiovascular Disease

## 2022-05-05 ENCOUNTER — Ambulatory Visit
Admission: RE | Admit: 2022-05-05 | Discharge: 2022-05-05 | Disposition: A | Discharge status: Home / Self Care | Payer: 59 | Source: Ambulatory Visit | Attending: Surgery | Admitting: Surgery

## 2022-05-05 ENCOUNTER — Ambulatory Visit: Payer: 59 | Admitting: Surgery

## 2022-05-05 ENCOUNTER — Encounter: Payer: Self-pay | Admitting: Surgery

## 2022-05-05 VITALS — BP 140/90 | HR 65 | Resp 20 | Ht 72.0 in | Wt 230.0 lb

## 2022-05-05 DIAGNOSIS — I7121 Aneurysm of the ascending aorta, without rupture: Secondary | ICD-10-CM

## 2022-05-05 MED ORDER — IOPAMIDOL (ISOVUE-370) INJECTION 76%
75.0000 mL | Freq: Once | INTRAVENOUS | Status: AC | PRN
Start: 2022-05-05 — End: 2022-05-05
  Administered 2022-05-05: 75 mL via INTRAVENOUS

## 2022-05-10 NOTE — Progress Notes (Signed)
HPI:  The patient is a 61 year old gentleman who I been following with a stable 5.0 cm aortic root aneurysm.  He had an echocardiogram done in October 2019 showing a trileaflet aortic valve with no regurgitation or stenosis.  Cardiac catheterization on 12/06/2018 showed 50% mid LAD stenosis and a relatively small vessel that did not reach the apex and has been treated medically.  He continues to do well without chest pain or shortness of breath.  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  He has made a good recovery from that.  Current Outpatient Medications  Medication Sig Dispense Refill   albuterol (VENTOLIN HFA) 108 (90 Base) MCG/ACT inhaler Inhale 2 puffs into the lungs every 6 (six) hours as needed. 18 g 4   amLODipine (NORVASC) 5 MG tablet TAKE 1 TABLET DAILY 90 tablet 3   aspirin EC 81 MG tablet Take 1 tablet (81 mg total) by mouth daily. 90 tablet 3   cetirizine (ZYRTEC) 10 MG tablet Take 10 mg by mouth daily.     metoprolol succinate (TOPROL-XL) 25 MG 24 hr tablet TAKE 1 TABLET DAILY 90 tablet 3   Omeprazole 20 MG TBEC Take 20 mg by mouth every other day.     rosuvastatin (CRESTOR) 40 MG tablet TAKE 1 TABLET DAILY (DOSE  INCREASE) 90 tablet 3   sildenafil (VIAGRA) 100 MG tablet TAKE ONE TABLET BY MOUTH ONE TIME DAILY AS NEEDED FOR ERECTILE DYSFUNCTION 10 tablet 0   benzonatate (TESSALON) 100 MG capsule Take 1 capsule (100 mg total) by mouth 3 (three) times daily as needed for cough. (Patient not taking: Reported on 05/05/2022) 30 capsule 0   doxycycline (MONODOX) 100 MG capsule Take 1 capsule (100 mg total) by mouth 2 (two) times daily. 14 capsule 0   Current Facility-Administered Medications  Medication Dose Route Frequency Provider Last Rate Last Admin   0.9 %  sodium chloride infusion  500 mL Intravenous Once Doran Stabler, MD         Physical Exam: BP 140/90   Pulse 65   Resp 20   Ht  6' (1.829 m)   Wt 230 lb (104.3 kg)   SpO2 93% Comment: RA  BMI 31.19 kg/m  He looks well. Cardiac exam shows a regular rate and rhythm with normal heart sounds.  There is no murmur. Lungs are clear.  Diagnostic Tests:  Narrative & Impression  CLINICAL DATA:  History aneurysmal dilatation of the aortic root.   EXAM: CT ANGIOGRAPHY CHEST WITH CONTRAST   TECHNIQUE: Multidetector CT imaging of the chest was performed using the standard protocol during bolus administration of intravenous contrast. Multiplanar CT image reconstructions and MIPs were obtained to evaluate the vascular anatomy.   RADIATION DOSE REDUCTION: This exam was performed according to the departmental dose-optimization program which includes automated exposure control, adjustment of the mA and/or kV according to patient size and/or use of iterative reconstruction technique.   CONTRAST:  44m ISOVUE-370 IOPAMIDOL (ISOVUE-370) INJECTION 76%   COMPARISON:  Multiple prior studies with the most recent dated 04/22/2021   FINDINGS: Cardiovascular: Stable dilated aortic root measures approximately 4.8-4.9 cm at the level of the sinuses of Valsalva by current CTA. The ascending thoracic aorta continues to be normal in caliber with maximum diameter of approximately 3.7 cm. The aortic arch measures 2.7-3.1 cm and the descending thoracic aorta 2.5 cm. No evidence of aortic dissection. Proximal great vessels  demonstrate stable and normal patency.   The heart size is normal and stable. No pericardial fluid identified. Stable mild calcified plaque in the distribution of the LAD.   Mediastinum/Nodes: No enlarged mediastinal, hilar, or axillary lymph nodes. Stable calcified left hilar lymph nodes. Thyroid gland, trachea, and esophagus demonstrate no significant findings.   Lungs/Pleura: Some scattered areas ground-glass and reticulonodular opacity are seen following bronchovascular distribution in the right upper  lobe which were not seen on the prior study. This is suggestive of an inflammatory process, potentially related to asthma or atypical infection. Correlation suggested clinically. There also appears to be some degree of chronic bronchial thickening in the right lower lobe which was present previously with some probable areas distal mucous plugging. There is no evidence of pulmonary edema, pneumothorax or pleural fluid.   Upper Abdomen: No acute abnormality.   Musculoskeletal: No chest wall abnormality. No acute or significant osseous findings.   Review of the MIP images confirms the above findings.   IMPRESSION: 1. Stable dilated aortic root measuring approximately 4.8-4.9 cm at the level of the sinuses of Valsalva. Recent follow-up has been performed annually and measurements appears stable since 2019. 2. New scattered ground-glass and reticulonodular opacity following a bronchovascular distribution in the right upper lobe suggestive of inflammation related to asthma or atypical infection. Correlation suggested clinically. There also is some degree of chronic bronchial thickening in the right lower lobe.   Aortic aneurysm NOS (ICD10-I71.9).     Electronically Signed   By: Aletta Edouard M.D.   On: 05/05/2022 12:35      Impression:  He has a stable aortic root aneurysm currently measured at 4.8-4.9 cm compared to 5.0 cm last year.  This is located at the level of the sinuses of Valsalva.  He has a normally functioning trileaflet aortic valve on prior echo.  This is still below the surgical threshold and has been stable since at least 2019.  I reviewed the CT images with him and answered his questions.  I stressed the importance of continued good blood pressure control in preventing further enlargement and acute aortic dissection.  He has some new scattered groundglass and reticulonodular densities in the right upper lobe suggestive of inflammation but is feeling fine and I do not  think any further intervention is needed for this.  That should resolve on its own.  Plan:  He will return to see me in 1 year with a CTA of the chest.  I spent 15 minutes performing this established patient evaluation and > 50% of this time was spent face to face counseling and coordinating the care of this patient's aortic aneurysm.    Gaye Pollack, MD [facility] [phone]

## 2022-05-23 ENCOUNTER — Other Ambulatory Visit: Payer: Self-pay | Admitting: Cardiovascular Disease

## 2022-07-13 ENCOUNTER — Other Ambulatory Visit: Payer: Self-pay

## 2022-07-13 ENCOUNTER — Other Ambulatory Visit: Payer: Self-pay | Admitting: Cardiovascular Disease

## 2022-07-14 ENCOUNTER — Encounter: Payer: Self-pay | Admitting: Family Medicine

## 2022-07-14 ENCOUNTER — Ambulatory Visit: Payer: 59 | Admitting: Family Medicine

## 2022-07-14 VITALS — BP 120/80 | HR 76 | Temp 97.6°F | Ht 73.0 in | Wt 231.4 lb

## 2022-07-14 DIAGNOSIS — J4531 Mild persistent asthma with (acute) exacerbation: Secondary | ICD-10-CM

## 2022-07-14 DIAGNOSIS — J040 Acute laryngitis: Secondary | ICD-10-CM | POA: Diagnosis not present

## 2022-07-14 MED ORDER — AZITHROMYCIN 250 MG PO TABS: ORAL_TABLET | ORAL | 0 refills | Status: DC

## 2022-07-14 MED ORDER — PREDNISONE 20 MG PO TABS: 40.0000 mg | ORAL_TABLET | Freq: Every day | ORAL | 0 refills | Status: AC

## 2022-07-14 NOTE — Patient Instructions (Addendum)
Start with the prednisone.   If no improvement, take the Zpak, I do not think you will need it.   Continue to push fluids, practice good hand hygiene, and cover your mouth if you cough.  If you start having fevers, shaking or shortness of breath, seek immediate care.  Let us know if you need anything.

## 2022-07-14 NOTE — Progress Notes (Signed)
Chief Complaint  Patient presents with   Cough    Congestion Sinus congestion Sore throat     Phillip [name] here for URI complaints.  Duration: 5 days  Associated symptoms: sinus congestion, ear fullness, sore throat, lost his voice, and dry cough Denies: sinus pain, rhinorrhea, itchy watery eyes, ear pain, ear drainage, wheezing, shortness of breath, myalgia, and fevers Treatment to date: Mucinex, Sudafed Sick contacts: No  Past Medical History:  Diagnosis Date   Allergy    Arthritis    Asthma    Followed by Pulmonology   CAD (coronary artery disease)    Chicken pox    Fistula    GERD (gastroesophageal reflux disease)    Hyperlipidemia    Hypertension    Thoracic aortic aneurysm (HCC)     Objective BP 120/80   Pulse 76   Temp 97.6 F (36.4 C) (Oral)   Ht '6\' 1"'$  (1.854 m)   Wt 231 lb 6 oz (105 kg)   SpO2 93%   BMI 30.53 kg/m  General: Awake, alert, appears stated age HEENT: AT, [place], ears patent b/l and TM's neg, nares patent w/o discharge, pharynx pink and without exudates, MMM, voice is hoarse/raspy compared to his baseline Neck: No masses or asymmetry Heart: RRR Lungs: coarse breath sounds and wheezing throughout, no accessory muscle use Psych: Age appropriate judgment and insight, normal mood and affect  Laryngitis - Plan: azithromycin (ZITHROMAX) 250 MG tablet  Mild persistent asthma with acute exacerbation - Plan: predniSONE (DELTASONE) 20 MG tablet  Exacerbation of chronic issue. 5 d pred burst 40 mg/d. Z-Pak if no improvement/worsening in the next 2 days. Continue to push fluids, practice good hand hygiene, cover mouth when coughing. F/u prn. If starting to experience fevers, shaking, or shortness of breath, seek immediate care. Pt voiced understanding and agreement to the plan.  [place], DO 07/14/22 11:56 AM

## 2022-07-27 ENCOUNTER — Telehealth: Payer: Self-pay | Admitting: Pulmonary Disease

## 2022-07-27 NOTE — Telephone Encounter (Signed)
Patient called to inform the doctor that his pharmacy [facility] will no longer cover his Floven after his next 30 days.  He received a letter from them stating that they will cover Pulicort instead as of 7/1.  Patient would like to know if he can get a prescription for that medication once his Flovent runs out.  Please advise.

## 2022-07-27 NOTE — Telephone Encounter (Signed)
Pt has not been seen since 09/2020. Pt will need an appt prior to being able to receive any med refills. Called and spoke with pt letting him know this info and have scheduled him an appt with [facility] 9/6. Nothing further needed.

## 2022-08-04 ENCOUNTER — Encounter: Payer: Self-pay | Admitting: Nurse Practitioner

## 2022-08-04 ENCOUNTER — Ambulatory Visit: Payer: 59 | Admitting: Nurse Practitioner

## 2022-08-04 ENCOUNTER — Ambulatory Visit (INDEPENDENT_AMBULATORY_CARE_PROVIDER_SITE_OTHER): Payer: 59

## 2022-08-04 VITALS — BP 130/80 | HR 78 | Ht 72.0 in | Wt 237.8 lb

## 2022-08-04 DIAGNOSIS — J4530 Mild persistent asthma, uncomplicated: Secondary | ICD-10-CM | POA: Diagnosis not present

## 2022-08-04 DIAGNOSIS — J189 Pneumonia, unspecified organism: Secondary | ICD-10-CM | POA: Diagnosis not present

## 2022-08-04 DIAGNOSIS — J4531 Mild persistent asthma with (acute) exacerbation: Secondary | ICD-10-CM

## 2022-08-04 DIAGNOSIS — J209 Acute bronchitis, unspecified: Secondary | ICD-10-CM

## 2022-08-04 MED ORDER — PULMICORT FLEXHALER 90 MCG/ACT IN AEPB: 2.0000 | INHALATION_SPRAY | Freq: Two times a day (BID) | RESPIRATORY_TRACT | 5 refills | Status: DC

## 2022-08-04 MED ORDER — DOXYCYCLINE HYCLATE 100 MG PO TABS: 100.0000 mg | ORAL_TABLET | Freq: Two times a day (BID) | ORAL | 0 refills | Status: DC

## 2022-08-04 NOTE — Progress Notes (Signed)
$'@Patient'a$  ID: Phillip [name], male    DOB: March 14, 1961, 61 y.o.   MRN: [id number]  Chief Complaint  Patient presents with   Follow-up    Medicine change    Referring provider: [name]*  HPI: 61 year old male, never smoker followed for asthma and allergic rhinitis. He is a patient of [name] and last seen in office on 10/20/2020. Past medical history significant for HTN, CAD, thoracic aortic aneurysm.   TEST/EVENTS:  05/05/2022 cardiac CT: No LAD.  There are some scattered areas of groundglass and reticulonodular opacity in the right upper lobe, not seen on prior study.  Suggestive of inflammatory process, dentally related to asthma or atypical infection.  Degree of chronic bronchial thickening in the right lower lobe as well.  Could be mucous plugging.  10/20/2020: OV with Dr. [name]. Chronic cough and congestion; improved with ICS. He has done well on Qvar. Refills sent. Follow up one year.  08/04/2022: Today-follow-up Patient presents today for overdue follow-up.  He was on Qvar for quite some time.  Had to change to Flovent due to his insurance.  Unfortunately, they no longer cover Flovent so he needs an alternative.  He has not been seen since 2021 so was instructed to follow-up.  He reports that his breathing has been relatively stable since we saw him last.  He did feel like he had some more chest tightness with the heat this summer but it did not limit his activities. He also had some viral infection last week. He was negative for COVID. He had high fevers for a day and then increased nasal congestion, cough and chest tightness. His PCP treated him with prednisone and z pack. He is feeling better today; although, he still has a persistent congested cough, which can be worse at night. Cough is non-productive. No increased dyspnea, wheezing, recurrent fevers. He takes zyrtec for allergies, which usually controls his symptoms.   Allergies  Allergen Reactions    Bactrim [Sulfamethoxazole-Trimethoprim] Rash    Joint pain   Penicillins Rash    DID THE REACTION INVOLVE: Swelling of the face/tongue/throat, SOB, or low BP? No Sudden or severe rash/hives, skin peeling, or the inside of the mouth or nose? No Did it require medical treatment? No When did it last happen? Childhood allergy If all above answers are "NO", may proceed with cephalosporin use.     Immunization History  Administered Date(s) Administered   Influenza Split 09/05/2006, 10/23/2007   Influenza Whole 09/20/2012   Influenza,inj,Quad PF,6+ Mos 07/30/2013, 09/01/2017, 09/08/2020   Influenza-Unspecified 08/29/2014, 09/08/2015, 09/18/2021   PFIZER(Purple Top)SARS-COV-2 Vaccination 11/19/2019, 12/12/2019, 08/29/2020   Td 09/29/2006   Tdap 09/01/2017    Past Medical History:  Diagnosis Date   Allergy    Arthritis    Asthma    Followed by Pulmonology   CAD (coronary artery disease)    Chicken pox    Fistula    GERD (gastroesophageal reflux disease)    Hyperlipidemia    Hypertension    Thoracic aortic aneurysm ([place])     Tobacco History: Social History   Tobacco Use  Smoking Status Never  Smokeless Tobacco Never   Counseling given: Not Answered   Outpatient Medications Prior to Visit  Medication Sig Dispense Refill   amLODipine (NORVASC) 5 MG tablet TAKE 1 TABLET DAILY 90 tablet 3   cetirizine (ZYRTEC) 10 MG tablet Take 10 mg by mouth daily.     FLOVENT HFA 110 MCG/ACT inhaler Inhale 2 puffs into the  lungs 2 (two) times daily.     albuterol (VENTOLIN HFA) 108 (90 Base) MCG/ACT inhaler Inhale 2 puffs into the lungs every 6 (six) hours as needed. 18 g 4   aspirin EC 81 MG tablet Take 1 tablet (81 mg total) by mouth daily. 90 tablet 3   metoprolol succinate (TOPROL-XL) 25 MG 24 hr tablet TAKE 1 TABLET DAILY 90 tablet 3   Omeprazole 20 MG TBEC Take 20 mg by mouth every other day.     rosuvastatin (CRESTOR) 40 MG tablet TAKE 1 TABLET DAILY (DOSE  INCREASE) 90 tablet 3    sildenafil (VIAGRA) 100 MG tablet take 1 tablet by mouth once a day as needed for erectile dysfunction 10 tablet 0   azithromycin (ZITHROMAX) 250 MG tablet Take 2 tabs the first day and then 1 tab daily until you run out. 6 tablet 0   Facility-Administered Medications Prior to Visit  Medication Dose Route Frequency Provider Last Rate Last Admin   0.9 %  sodium chloride infusion  500 mL Intravenous Once Nelida Meuse III, MD         Review of Systems:   Constitutional: No weight loss or gain, night sweats, fevers, chills, fatigue, or lassitude. HEENT: No headaches, difficulty swallowing, tooth/dental problems, or sore throat. No sneezing, itching, ear ache. +nasal congestion, post nasal drip (improving) CV:  No chest pain, orthopnea, PND, swelling in lower extremities, anasarca, dizziness, palpitations, syncope Resp: +congested, non-productive cough; chest congestion/tightness (improving). No shortness of breath with exertion or at rest. No excess mucus or change in color of mucus. No hemoptysis. No wheezing.  No chest wall deformity GI:  No heartburn, indigestion, abdominal pain, nausea, vomiting, diarrhea, change in bowel habits, loss of appetite, bloody stools.  Neuro: No dizziness or lightheadedness.  Psych: No depression or anxiety. Mood stable.     Physical Exam:  BP 130/80 (BP Location: Right Arm)   Pulse 78   Ht 6' (1.829 m)   Wt 237 lb 12.8 oz (107.9 kg)   SpO2 94%   BMI 32.25 kg/m   GEN: Pleasant, interactive, well-appearing; obese; in no acute distress. HEENT:  Normocephalic and atraumatic. PERRLA. Sclera white. Nasal turbinates erythematous, moist and patent bilaterally. clear rhinorrhea present. Oropharynx erythematous and moist, without exudate or edema. No lesions, ulcerations NECK:  Supple w/ fair ROM. No JVD present. Normal carotid impulses w/o bruits. Thyroid symmetrical with no goiter or nodules palpated. No lymphadenopathy.   CV: RRR, no m/r/g, no peripheral  edema. Pulses intact, +2 bilaterally. No cyanosis, pallor or clubbing. PULMONARY:  Unlabored, regular breathing. Clear bilaterally A&P w/o wheezes/rales/rhonchi. No accessory muscle use. No dullness to percussion. GI: BS present and normoactive. Soft, non-tender to palpation.  MSK: No erythema, warmth or tenderness.  Neuro: A/Ox3. No focal deficits noted.   Skin: Warm, no lesions or rashe Psych: Normal affect and behavior. Judgement and thought content appropriate.     Lab Results:  CBC    Component Value Date/Time   WBC 10.6 (H) 08/21/2021 0402   RBC 4.54 08/21/2021 0402   HGB 13.8 08/21/2021 0402   HGB 15.1 12/01/2018 0735   HCT 41.5 08/21/2021 0402   HCT 43.7 12/01/2018 0735   PLT 200 08/21/2021 0402   PLT 266 12/01/2018 0735   MCV 91.4 08/21/2021 0402   MCV 88 12/01/2018 0735   MCH 30.4 08/21/2021 0402   MCHC 33.3 08/21/2021 0402   RDW 12.8 08/21/2021 0402   RDW 12.9 12/01/2018 0735   LYMPHSABS 1.9  07/31/2021 1454   MONOABS 0.7 07/31/2021 1454   EOSABS 0.4 07/31/2021 1454   BASOSABS 0.1 07/31/2021 1454    BMET    Component Value Date/Time   NA 139 08/21/2021 0402   NA 139 12/01/2018 0735   K 3.6 08/21/2021 0402   CL 104 08/21/2021 0402   CO2 28 08/21/2021 0402   GLUCOSE 108 (H) 08/21/2021 0402   BUN 14 08/21/2021 0402   BUN 11 12/01/2018 0735   CREATININE 0.93 08/21/2021 0402   CALCIUM 9.0 08/21/2021 0402   GFRNONAA >60 08/21/2021 0402   GFRAA 110 12/01/2018 0735    BNP No results found for: "BNP"   Imaging:  DG Chest 2 View  Result Date: 08/04/2022 CLINICAL DATA:  Persistent cough EXAM: CHEST - 2 VIEW COMPARISON:  Chest radiograph 05/18/2018 FINDINGS: Stable cardiomediastinal contours. There are a few mild opacities at the left lung base which may reflect atelectasis though mild infection not excluded. No pneumothorax or pleural effusion. Visualized skeleton is unremarkable. IMPRESSION: Mild opacities at the left lung base may reflect atelectasis though  mild infection not excluded. Electronically Signed   By: Audie Pinto M.D.   On: 08/04/2022 15:51          No data to display          No results found for: "NITRICOXIDE"      Assessment & Plan:   Acute bronchitis Recently treated for URI with bronchitis and laryngitis. He completed steroids and z pack prescribed by his PCP. He is improving but still with a congested cough. Check CXR today to rule out superimposed infection. Continue supportive care measures. Unable to complete FeNO today but lung exam clear so will hold off on further steroid therapy.  Patient Instructions  Continue Zyrtec 1 tab daily for allergies Continue Albuterol inhaler 2 puffs every 6 hours as needed for shortness of breath or wheezing. Notify if symptoms persist despite rescue inhaler/neb use.  We will change your inhaler to Pulmicort, per insurance request. 2 puffs Twice daily. Brush tongue and rinse mouth afterwards.  Flonase nasal spray 2 sprays each nostril daily for nasal congestion/drainage Saline nasal spray 2-3 times a day for nasal congestion Mucinex 600 mg Twice daily until nasal congestion improves Delsym 2 tsp Twice daily as needed for cough  Follow up in 6 weeks with Dr. [name] or Alanson Aly. If symptoms do not improve or worsen, please contact office for sooner follow up or seek emergency care.     Mild persistent asthma Compensated on current regimen. Recent respiratory symptoms related to viral illness. See above plan. Continue daily ICS; will change him to Pulmicort 2 puffs Twice daily per his insurance request.    I spent 35 minutes of dedicated to the care of this patient on the date of this encounter to include pre-visit review of records, face-to-face time with the patient discussing conditions above, post visit ordering of testing, clinical documentation with the electronic health record, making appropriate referrals as documented, and communicating necessary findings to  members of the patients care team.  Clayton Bibles, NP 08/04/2022  Pt aware and understands NP's role.

## 2022-08-04 NOTE — Assessment & Plan Note (Addendum)
Recently treated for URI with bronchitis and laryngitis. He completed steroids and z pack prescribed by his PCP. He is improving but still with a congested cough. Check CXR today to rule out superimposed infection. Continue supportive care measures. Unable to complete FeNO today but lung exam clear so will hold off on further steroid therapy.  Patient Instructions  Continue Zyrtec 1 tab daily for allergies Continue Albuterol inhaler 2 puffs every 6 hours as needed for shortness of breath or wheezing. Notify if symptoms persist despite rescue inhaler/neb use.  We will change your inhaler to Pulmicort, per insurance request. 2 puffs Twice daily. Brush tongue and rinse mouth afterwards.  Flonase nasal spray 2 sprays each nostril daily for nasal congestion/drainage Saline nasal spray 2-3 times a day for nasal congestion Mucinex 600 mg Twice daily until nasal congestion improves Delsym 2 tsp Twice daily as needed for cough  Follow up in 6 weeks with Dr. [name] or Alanson Aly. If symptoms do not improve or worsen, please contact office for sooner follow up or seek emergency care.

## 2022-08-04 NOTE — Assessment & Plan Note (Signed)
Compensated on current regimen. Recent respiratory symptoms related to viral illness. See above plan. Continue daily ICS; will change him to Pulmicort 2 puffs Twice daily per his insurance request.

## 2022-08-04 NOTE — Progress Notes (Signed)
Notified patient left lung base opacity, possibly pneumonia. Complete z pack recently. We will treat him with doxycycline course x 7 days. Verbalized understanding.

## 2022-08-04 NOTE — Patient Instructions (Addendum)
Continue Zyrtec 1 tab daily for allergies Continue Albuterol inhaler 2 puffs every 6 hours as needed for shortness of breath or wheezing. Notify if symptoms persist despite rescue inhaler/neb use.  We will change your inhaler to Pulmicort, per insurance request. 2 puffs Twice daily. Brush tongue and rinse mouth afterwards.  Flonase nasal spray 2 sprays each nostril daily for nasal congestion/drainage Saline nasal spray 2-3 times a day for nasal congestion Mucinex 600 mg Twice daily until nasal congestion improves Delsym 2 tsp Twice daily as needed for cough  Follow up in 6 weeks with Dr. [name] or Alanson Aly. If symptoms do not improve or worsen, please contact office for sooner follow up or seek emergency care.

## 2022-08-08 ENCOUNTER — Other Ambulatory Visit: Payer: Self-pay | Admitting: Pulmonary Disease

## 2022-08-14 ENCOUNTER — Other Ambulatory Visit: Payer: Self-pay | Admitting: Nurse Practitioner

## 2022-08-14 DIAGNOSIS — J4531 Mild persistent asthma with (acute) exacerbation: Secondary | ICD-10-CM

## 2022-09-14 IMAGING — CT CT ANGIO CHEST
2 of 7 series · 16 of 36 positions shown · IV contrast (agent unspecified)
Comparison: Multiple prior studies with the most recent dated
04/22/2021

CLINICAL DATA: History aneurysmal dilatation of the aortic root.

EXAM:
CT ANGIOGRAPHY CHEST WITH CONTRAST
TECHNIQUE: Multidetector CT imaging of the chest was performed using the
standard protocol during bolus administration of intravenous
contrast. Multiplanar CT image reconstructions and MIPs were
obtained to evaluate the vascular anatomy.

[Series 9: cta thorax 1.00 bv36 s3 arterial thins · axial · arterial · 0.79mm/px · z∈[+1355,+1678]mm · 15 of 616 slices shown]
[im 39/616  lung]
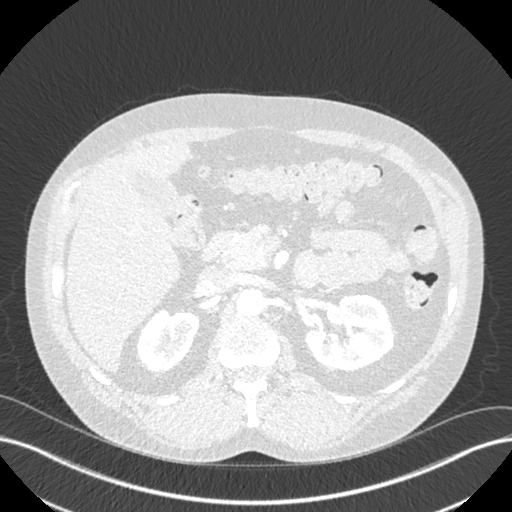
[im 77/616  mediastinal]
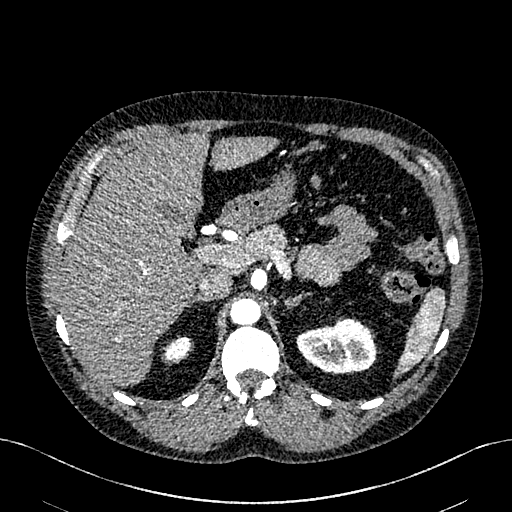
[im 116/616  lung]
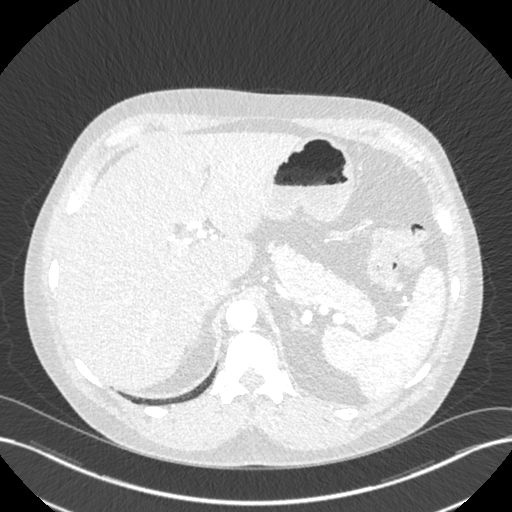
[im 154/616  mediastinal]
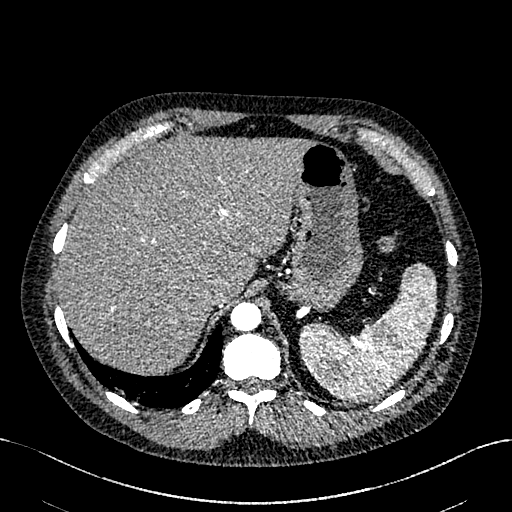
[im 193/616  lung]
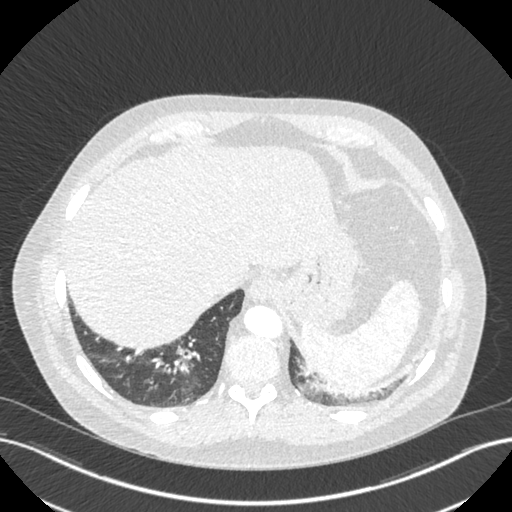
[im 231/616  mediastinal]
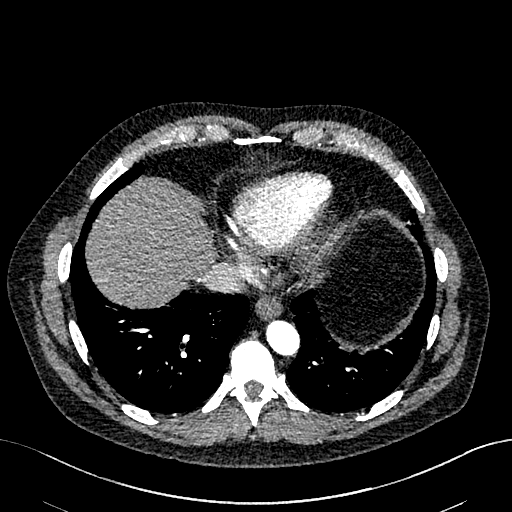
[im 270/616  lung]
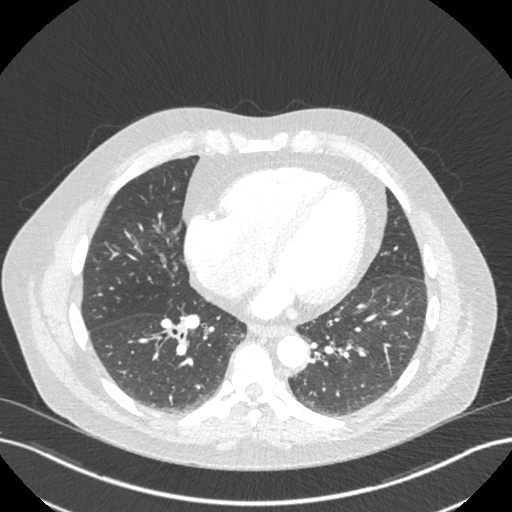
[im 308/616  mediastinal]
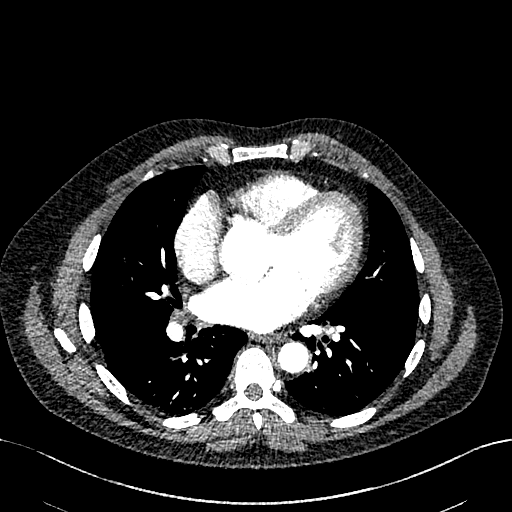
[im 346/616  lung]
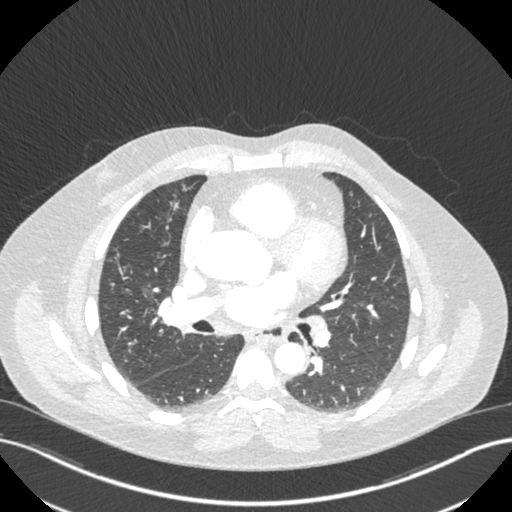
[im 385/616  mediastinal]
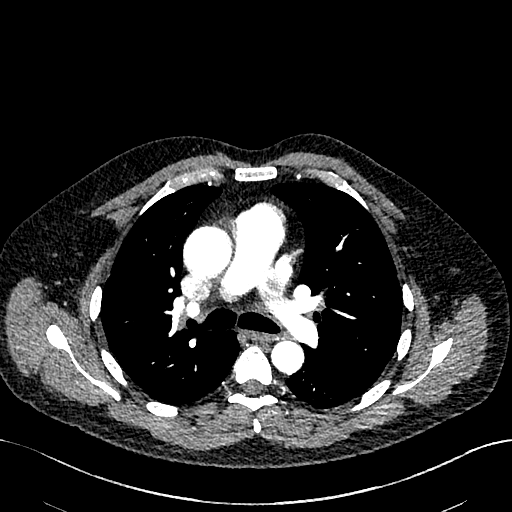
[im 423/616  lung]
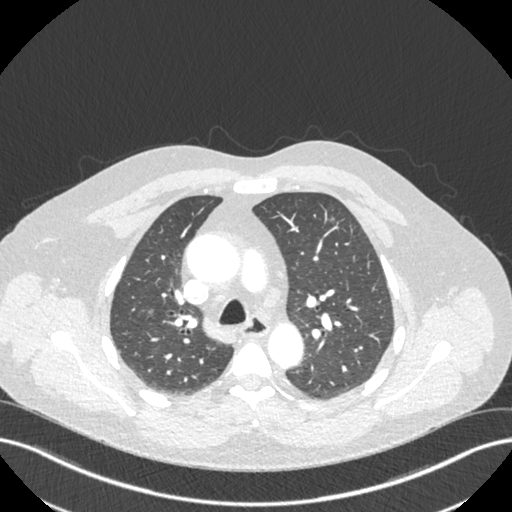
[im 462/616  mediastinal]
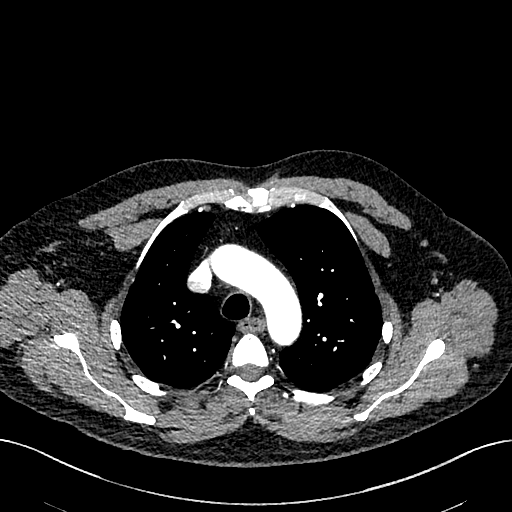
[im 500/616  lung]
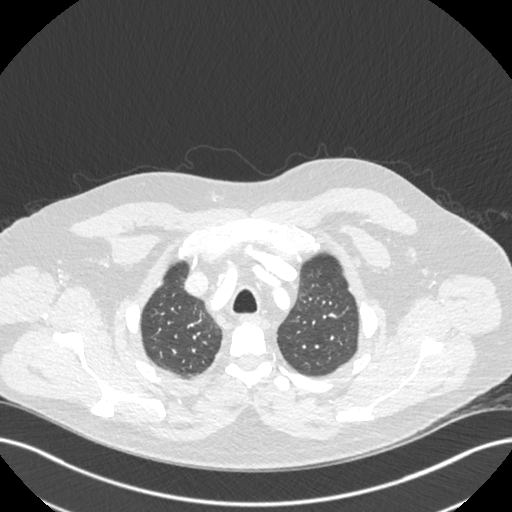
[im 539/616  mediastinal]
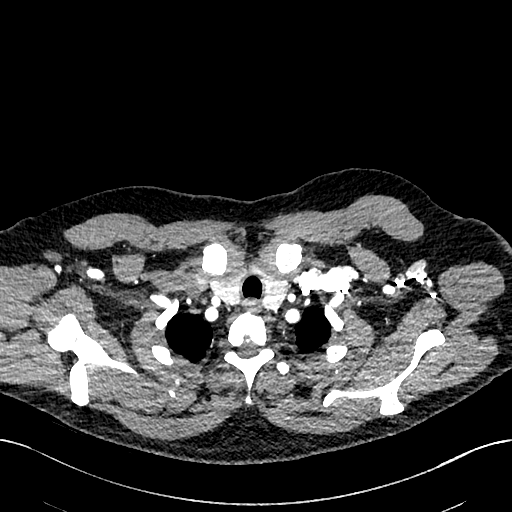
[im 577/616  lung]
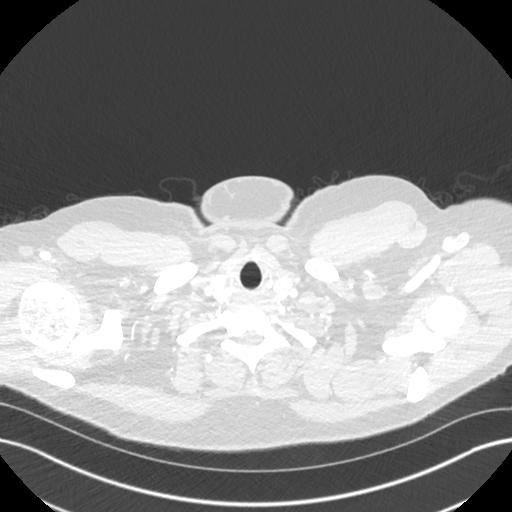

[Series 10: cta thorax 2.00 bv36 s3 cor st · coronal · 0.72mm/px · 1 of 161 slices shown]
[im 81/161  mediastinal]
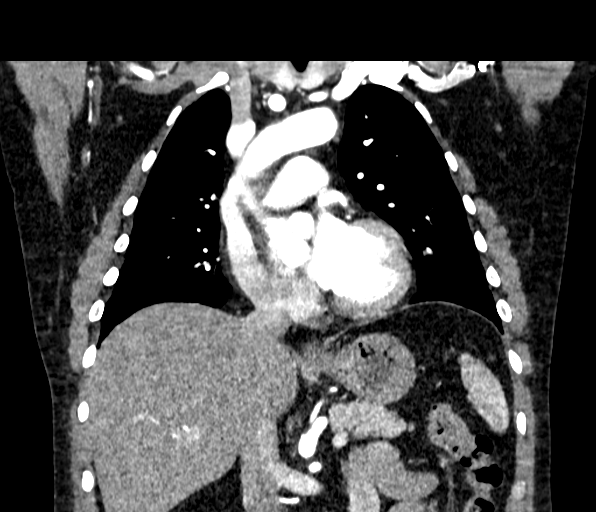

[16 of 36 positions shown; findings below may reference images not displayed]

RADIATION DOSE REDUCTION: This exam was performed according to the
departmental dose-optimization program which includes automated
exposure control, adjustment of the mA and/or kV according to
patient size and/or use of iterative reconstruction technique.

CONTRAST:  75mL TNXS40-U2M IOPAMIDOL (TNXS40-U2M) INJECTION 76%
FINDINGS: Cardiovascular: Stable dilated aortic root measures approximately
4.8-4.9 cm at the level of the sinuses of Valsalva by current CTA.
The ascending thoracic aorta continues to be normal in caliber with
maximum diameter of approximately 3.7 cm. The aortic arch measures
2.7-3.1 cm and the descending thoracic aorta 2.5 cm. No evidence of
aortic dissection. Proximal great vessels demonstrate stable and
normal patency.

The heart size is normal and stable. No pericardial fluid
identified. Stable mild calcified plaque in the distribution of the
LAD.

Mediastinum/Nodes: No enlarged mediastinal, hilar, or axillary lymph
nodes. Stable calcified left hilar lymph nodes. Thyroid gland,
trachea, and esophagus demonstrate no significant findings.

Lungs/Pleura: Some scattered areas ground-glass and reticulonodular
opacity are seen following bronchovascular distribution in the right
upper lobe which were not seen on the prior study. This is
suggestive of an inflammatory process, potentially related to asthma
or atypical infection. Correlation suggested clinically. There also
appears to be some degree of chronic bronchial thickening in the
right lower lobe which was present previously with some probable
areas distal mucous plugging. There is no evidence of pulmonary
edema, pneumothorax or pleural fluid.

Upper Abdomen: No acute abnormality.

Musculoskeletal: No chest wall abnormality. No acute or significant
osseous findings.

Review of the MIP images confirms the above findings.
IMPRESSION: 1. Stable dilated aortic root measuring approximately 4.8-4.9 cm at
the level of the sinuses of Valsalva. Recent follow-up has been
performed annually and measurements appears stable since [DATE]. New scattered ground-glass and reticulonodular opacity following
a bronchovascular distribution in the right upper lobe suggestive of
inflammation related to asthma or atypical infection. Correlation
suggested clinically. There also is some degree of chronic bronchial
thickening in the right lower lobe.

Aortic aneurysm NOS ([id number]).

## 2022-10-26 ENCOUNTER — Encounter: Payer: Self-pay | Admitting: Cardiovascular Disease

## 2022-10-27 ENCOUNTER — Other Ambulatory Visit (HOSPITAL_BASED_OUTPATIENT_CLINIC_OR_DEPARTMENT_OTHER): Payer: Self-pay

## 2022-10-27 MED ORDER — AMLODIPINE BESYLATE 5 MG PO TABS
5.0000 mg | ORAL_TABLET | Freq: Every day | ORAL | 3 refills | Status: DC
  Filled 2022-10-27: qty 90, 90d supply, fill #0
  Filled 2023-01-30: qty 90, 90d supply, fill #1
  Filled 2023-04-26: qty 90, 90d supply, fill #2
  Filled 2023-07-18: qty 90, 90d supply, fill #3

## 2022-10-27 MED ORDER — SILDENAFIL CITRATE 100 MG PO TABS
100.0000 mg | ORAL_TABLET | Freq: Every day | ORAL | 0 refills | Status: DC
  Filled 2022-10-27: qty 6, 30d supply, fill #0
  Filled 2023-01-14: qty 4, 20d supply, fill #1

## 2022-10-27 MED ORDER — ROSUVASTATIN CALCIUM 40 MG PO TABS
40.0000 mg | ORAL_TABLET | Freq: Every day | ORAL | 3 refills | Status: DC
  Filled 2022-10-27: qty 90, 90d supply, fill #0
  Filled 2023-03-13: qty 90, 90d supply, fill #1
  Filled 2023-06-13: qty 90, 90d supply, fill #2
  Filled 2023-07-18 – 2023-08-30 (×2): qty 90, 90d supply, fill #3

## 2022-10-27 MED ORDER — METOPROLOL SUCCINATE ER 25 MG PO TB24
25.0000 mg | ORAL_TABLET | Freq: Every day | ORAL | 3 refills | Status: DC
  Filled 2022-10-27: qty 90, 90d supply, fill #0
  Filled 2023-03-24: qty 90, 90d supply, fill #1
  Filled 2023-07-18: qty 90, 90d supply, fill #2
  Filled 2023-10-06: qty 90, 90d supply, fill #3

## 2022-11-17 ENCOUNTER — Other Ambulatory Visit (HOSPITAL_BASED_OUTPATIENT_CLINIC_OR_DEPARTMENT_OTHER): Payer: Self-pay

## 2022-11-17 MED ORDER — BECLOMETHASONE DIPROP HFA 80 MCG/ACT IN AERB
2.0000 | INHALATION_SPRAY | Freq: Two times a day (BID) | RESPIRATORY_TRACT | 6 refills | Status: DC
  Filled 2022-11-17: qty 10.6, 30d supply, fill #0

## 2022-11-17 NOTE — Telephone Encounter (Signed)
Phillip [name], pt is wanting to change from Pulmicort to Qvar. Pt was previously on Qvar and had to change due to insurance coverage. Thanks.

## 2022-11-17 NOTE — Telephone Encounter (Signed)
Yes, that is fine. Please send Qvar 2 puffs Twice daily. Brush tongue and rinse mouth afterwards. Can d/c pulmicort. Thanks.

## 2022-11-18 ENCOUNTER — Other Ambulatory Visit (HOSPITAL_COMMUNITY): Payer: Self-pay

## 2022-11-18 ENCOUNTER — Telehealth: Payer: Self-pay

## 2022-11-18 DIAGNOSIS — J4530 Mild persistent asthma, uncomplicated: Secondary | ICD-10-CM

## 2022-11-18 NOTE — Telephone Encounter (Signed)
PA request received via CMM through [place] for Qvar RediHaler 80MCG/ACT aerosol  Key: [id number] - PA Case ID: [id number]   PA not submitted due to preferred Brand Flovent HFA being covered.  Please advise if med change is appropriate.

## 2022-11-18 NOTE — Telephone Encounter (Signed)
His preferred inhaler is Qvar, not Flovent. Is this not covered? Thanks

## 2022-11-19 ENCOUNTER — Other Ambulatory Visit (HOSPITAL_BASED_OUTPATIENT_CLINIC_OR_DEPARTMENT_OTHER): Payer: Self-pay

## 2022-11-19 ENCOUNTER — Other Ambulatory Visit (HOSPITAL_COMMUNITY): Payer: Self-pay

## 2022-11-19 NOTE — Telephone Encounter (Signed)
Qvar is not preferred under current plan. Brand Flovent is covered, I do see the previous messages in the patients chart about it not being covered; however the pharmacy may not have been trying Brand Name. Trial of Arnuity Ellipta and Flovent Diskus/Flovent HFA are needed in the past 365 days in order for Qvar to be covered per the test claim.

## 2022-11-23 ENCOUNTER — Other Ambulatory Visit (HOSPITAL_BASED_OUTPATIENT_CLINIC_OR_DEPARTMENT_OTHER): Payer: Self-pay

## 2022-11-25 ENCOUNTER — Other Ambulatory Visit (HOSPITAL_BASED_OUTPATIENT_CLINIC_OR_DEPARTMENT_OTHER): Payer: Self-pay

## 2022-11-25 ENCOUNTER — Other Ambulatory Visit: Payer: Self-pay | Admitting: Nurse Practitioner

## 2022-11-25 DIAGNOSIS — J4530 Mild persistent asthma, uncomplicated: Secondary | ICD-10-CM

## 2022-11-25 MED ORDER — FLUTICASONE PROPIONATE HFA 110 MCG/ACT IN AERO: 2.0000 | INHALATION_SPRAY | Freq: Two times a day (BID) | RESPIRATORY_TRACT | 5 refills | Status: DC

## 2022-11-25 NOTE — Addendum Note (Signed)
Addended by: Clayton Bibles on: 11/25/2022 08:39 AM   Modules accepted: Orders

## 2022-11-25 NOTE — Telephone Encounter (Signed)
Patient had requested change back to Qvar as his insurance changed. Please let him not that Qvar is still not covered under current plan. Flovent or Arnuity are preferred inhaler options (both contain the medication fluticasone). I will send Flovent HFA since he preferred the Qvar before as this is similar delivery method. He should use it twice daily and brush tongue/gargle after use. Thanks.

## 2022-12-03 ENCOUNTER — Other Ambulatory Visit (HOSPITAL_BASED_OUTPATIENT_CLINIC_OR_DEPARTMENT_OTHER): Payer: Self-pay

## 2022-12-06 ENCOUNTER — Other Ambulatory Visit (HOSPITAL_BASED_OUTPATIENT_CLINIC_OR_DEPARTMENT_OTHER): Payer: Self-pay

## 2022-12-06 MED ORDER — FLUTICASONE PROPIONATE HFA 110 MCG/ACT IN AERO
2.0000 | INHALATION_SPRAY | Freq: Two times a day (BID) | RESPIRATORY_TRACT | 6 refills | Status: DC
  Filled 2022-12-06: qty 12, 30d supply, fill #0
  Filled 2023-01-14: qty 12, 30d supply, fill #1
  Filled 2023-02-17: qty 12, 30d supply, fill #2
  Filled 2023-03-24: qty 12, 30d supply, fill #3
  Filled 2023-04-22: qty 12, 30d supply, fill #4
  Filled 2023-05-17: qty 12, 30d supply, fill #5
  Filled 2023-06-13: qty 12, 30d supply, fill #6

## 2023-01-14 ENCOUNTER — Other Ambulatory Visit (HOSPITAL_BASED_OUTPATIENT_CLINIC_OR_DEPARTMENT_OTHER): Payer: Self-pay

## 2023-02-10 ENCOUNTER — Other Ambulatory Visit (HOSPITAL_BASED_OUTPATIENT_CLINIC_OR_DEPARTMENT_OTHER): Payer: Self-pay

## 2023-02-17 ENCOUNTER — Other Ambulatory Visit: Payer: Self-pay | Admitting: Cardiovascular Disease

## 2023-02-17 ENCOUNTER — Other Ambulatory Visit (HOSPITAL_BASED_OUTPATIENT_CLINIC_OR_DEPARTMENT_OTHER): Payer: Self-pay

## 2023-02-17 MED ORDER — SILDENAFIL CITRATE 100 MG PO TABS
100.0000 mg | ORAL_TABLET | Freq: Every day | ORAL | 0 refills | Status: DC
  Filled 2023-02-17: qty 10, 10d supply, fill #0

## 2023-03-24 ENCOUNTER — Other Ambulatory Visit (HOSPITAL_BASED_OUTPATIENT_CLINIC_OR_DEPARTMENT_OTHER): Payer: Self-pay

## 2023-03-24 ENCOUNTER — Other Ambulatory Visit: Payer: Self-pay | Admitting: Cardiovascular Disease

## 2023-03-24 ENCOUNTER — Other Ambulatory Visit: Payer: Self-pay

## 2023-03-24 MED ORDER — SILDENAFIL CITRATE 100 MG PO TABS
100.0000 mg | ORAL_TABLET | Freq: Every day | ORAL | 0 refills | Status: DC
  Filled 2023-03-24: qty 5, 5d supply, fill #0

## 2023-04-12 ENCOUNTER — Other Ambulatory Visit: Payer: Self-pay | Admitting: Surgery

## 2023-04-12 DIAGNOSIS — I7121 Aneurysm of the ascending aorta, without rupture: Secondary | ICD-10-CM

## 2023-04-22 ENCOUNTER — Other Ambulatory Visit: Payer: Self-pay

## 2023-04-22 ENCOUNTER — Other Ambulatory Visit (HOSPITAL_BASED_OUTPATIENT_CLINIC_OR_DEPARTMENT_OTHER): Payer: Self-pay

## 2023-04-22 ENCOUNTER — Other Ambulatory Visit: Payer: Self-pay | Admitting: Cardiovascular Disease

## 2023-04-22 MED ORDER — SILDENAFIL CITRATE 100 MG PO TABS
100.0000 mg | ORAL_TABLET | Freq: Every day | ORAL | 0 refills | Status: DC
  Filled 2023-04-22: qty 6, 30d supply, fill #0
  Filled 2023-06-13: qty 4, 30d supply, fill #1

## 2023-04-22 NOTE — Telephone Encounter (Signed)
Pt's pharmacy is requesting a refill on sildenafil. Would Dr. Mcalhany like to refill this medication? Please address 

## 2023-05-03 ENCOUNTER — Telehealth: Payer: Self-pay | Admitting: Cardiovascular Disease

## 2023-05-03 NOTE — Telephone Encounter (Signed)
Pt would like a callback regarding whether he needs to have labs done before coming in for yearly f/u on 6/18. Please advise

## 2023-05-03 NOTE — Telephone Encounter (Signed)
Called and spoke w the patient.  He has his yearly physcial w PCP same day right after appointment with APP.  No plans for labs for prior to visit.

## 2023-05-16 ENCOUNTER — Other Ambulatory Visit: Payer: Self-pay | Admitting: Surgery

## 2023-05-16 DIAGNOSIS — I7121 Aneurysm of the ascending aorta, without rupture: Secondary | ICD-10-CM

## 2023-05-16 NOTE — Progress Notes (Unsigned)
Cardiology Office Note    Date:  05/17/2023   ID:  Phillip [name], DOB 07-27-1961, MRN [id number]  PCP:  Sharlene Dory, DO  Cardiologist:  Verne Carrow, MD  Electrophysiologist:  None   Chief Complaint: f/u CAD  History of Present Illness:   Phillip [name] is a 62 y.o. male with history of ascending thoracic aortic aneurysm followed by Dr. Laneta Simmers, moderate CAD, GERD, HLD, HTN, IRBBB, asthma followed by pulmonology, ED, GERD, prior colon surgery for diverticulosis who is seen for follow-up.  Gated cardiac CT 2019 showed possible flow limiting lesion in the mid LAD, prompting cardiac cath 11/2018 with moderate disease in the small mid LAD and mild disease in the RCA and Circumflex, managed medically. Echo at that time showed mild LVH, EF 50-55%, dilated aorta which has been followed by Dr. Laneta Simmers with last CTA 04/2022 with stable dilated aortic root measuring approximately 4.8-4.9 cm at the level of the sinuses of Valsalva. Wife Liborio Nixon is a Engineer, civil (consulting) in CVICU.  He is seen for follow-up doing well. No CP or exertional dyspnea. He did notice some mild SOB [place] but attributes this to the heat. He otherwise had been recently walking regularly at the gym without any limitations then got out of the habit. He plans to start back up. He has noticed some mild ankle edema while on his feet bass fishing. He has also noticed intermittent nonproductive cough since higher pollen this spring, not present today. Overall he is pleased with how he is feeling.   Labwork independently reviewed: 02/2022 LDL 67, trig 71 07/2021 Hgb 13.8, plt wnl, K 3.6, Cr 0.93, LFTs ok 2017 TSH wnl  Past History   Past Medical History:  Diagnosis Date   Allergy    Arthritis    Asthma    Followed by Pulmonology   CAD (coronary artery disease)    Chicken pox    Fistula    GERD (gastroesophageal reflux disease)    Hyperlipidemia    Hypertension    Thoracic  aortic aneurysm [facility])     Past Surgical History:  Procedure Laterality Date   BOWEL RESECTION N/A 08/19/2021   Procedure: ROBOTIC LOW ANTERIOR BOWEL RESECTION;  Surgeon: Andria Meuse, MD;  Location: [facility];  Service: General;  Laterality: N/A;   COLONOSCOPY     CYSTOSCOPY     to check bladder   DENTAL SURGERY     ESOPHAGOGASTRODUODENOSCOPY     FLEXIBLE SIGMOIDOSCOPY N/A 08/19/2021   Procedure: FLEXIBLE SIGMOIDOSCOPY;  Surgeon: Andria Meuse, MD;  Location: [facility];  Service: General;  Laterality: N/A;   LEFT HEART CATH AND CORONARY ANGIOGRAPHY N/A 12/06/2018   Procedure: LEFT HEART CATH AND CORONARY ANGIOGRAPHY;  Surgeon: Kathleene Hazel, MD;  Location: [facility];  Service: Cardiovascular;  Laterality: N/A;   LEG SURGERY Left 2007   MASTECTOMY     WISDOM TOOTH EXTRACTION      Current Medications: Current Meds  Medication Sig   albuterol (VENTOLIN HFA) 108 (90 Base) MCG/ACT inhaler Inhale 2 puffs into the lungs every 6 (six) hours as needed.   amLODipine (NORVASC) 5 MG tablet Take 1 tablet (5 mg total) by mouth daily.   aspirin EC 81 MG tablet Take 1 tablet (81 mg total) by mouth daily.   cetirizine (ZYRTEC) 10 MG tablet Take 10 mg by mouth daily.   fluticasone (FLOVENT HFA) 110 MCG/ACT inhaler Inhale 2 puffs into the lungs in the  morning and at bedtime.   metoprolol succinate (TOPROL-XL) 25 MG 24 hr tablet Take 1 tablet (25 mg total) by mouth daily.   Omeprazole 20 MG TBEC Take 20 mg by mouth every other day.   rosuvastatin (CRESTOR) 40 MG tablet Take 1 tablet (40 mg total) by mouth daily.   sildenafil (VIAGRA) 100 MG tablet Take 1 tablet (100 mg total) by mouth daily. Please call [phone] to schedule an appointment for future refills. Thank you. Final attempt.   [DISCONTINUED] doxycycline (VIBRA-TABS) 100 MG tablet Take 1 tablet (100 mg total) by mouth 2 (two) times daily.    Allergies:   Bactrim [sulfamethoxazole-trimethoprim] and Penicillins    Social History   Socioeconomic History   Marital status: Married    Spouse name: Not on file   Number of children: 2   Years of education: Not on file   Highest education level: Not on file  Occupational History   Occupation: management health care  Tobacco Use   Smoking status: Never   Smokeless tobacco: Never  Vaping Use   Vaping Use: Never used  Substance and Sexual Activity   Alcohol use: Yes    Alcohol/week: 0.0 standard drinks of alcohol    Comment: socially   Drug use: No   Sexual activity: Not on file  Other Topics Concern   Not on file  Social History Narrative   Not on file   Social Determinants of Health   Financial Resource Strain: Not on file  Food Insecurity: Not on file  Transportation Needs: Not on file  Physical Activity: Not on file  Stress: Not on file  Social Connections: Not on file     Family History:  The patient's family history includes Aortic aneurysm in his mother; Colon cancer in an other family member; Emphysema in his maternal uncle; Esophageal cancer in his paternal grandmother; Hiatal hernia in his father; Hodgkin's lymphoma in his child; Hypertension in his mother; Rectal cancer in an other family member; Stomach cancer in an other family member. There is no history of Colon polyps.  ROS:   Please see the history of present illness. Minor joint aches at times, does not wish to change statin. All other systems are reviewed and otherwise negative.    EKG(s)/Additional Testing   EKG:  EKG is ordered today, personally reviewed, demonstrating NSR 65bpm LAD IRBBB no acute STT changes similar to prior  CV Studies: Cardiac studies reviewed are outlined and summarized above. Otherwise please see EMR for full report.  Recent Labs: No results found for requested labs within last 365 days.  Recent Lipid Panel    Component Value Date/Time   CHOL 123 03/15/2022 0823   TRIG 71 03/15/2022 0823   HDL 41 03/15/2022 0823   CHOLHDL 3.0  03/15/2022 0823   CHOLHDL 5 09/01/2017 0820   VLDL 27.0 09/01/2017 0820   LDLCALC 67 03/15/2022 0823    PHYSICAL EXAM:    VS:  BP 126/74   Pulse 65   Ht 6' (1.829 m)   Wt 234 lb (106.1 kg)   SpO2 97%   BMI 31.74 kg/m   BMI: Body mass index is 31.74 kg/m.  GEN: Well nourished, well developed male in no acute distress HEENT: normocephalic, atraumatic Neck: no JVD, carotid bruits, or masses Cardiac: RRR; no murmurs, rubs, or gallops, no edema  Respiratory:  clear to auscultation bilaterally, normal work of breathing GI: soft, nontender, nondistended, + BS MS: no deformity or atrophy Skin: warm and dry, no  rash Neuro:  Alert and Oriented x 3, Strength and sensation are intact, follows commands Psych: euthymic mood, full affect  Wt Readings from Last 3 Encounters:  05/17/23 234 lb (106.1 kg)  08/04/22 237 lb 12.8 oz (107.9 kg)  07/14/22 231 lb 6 oz (105 kg)     ASSESSMENT & PLAN:   1. CAD, Hyperlipidemia - doing well on present regimen. Has occasional joint aches which he reports he had discussed with Dr. Clifton James in the past and he preferred to continue present regimen. He affirms this decision today. Continue ASA, BB, statin, amlodipine. Patient to notify for any worsening symptoms, otherwise f/u 1 year. EKG shows no change from prior, NSR with IRBBB without symptoms of bradycardia. Patient reports having routine physical bloodwork later today with PCP. He will ask them to send [place] a copy.  2. Dilation of aorta - managed by Dr. Laneta Simmers with repeat planned next week. It appeared the order was entered by CVTS team as CT Angio PE study. I reached out to their office via secure chat and they will review to enter the correct title (CT Angio Chest Aorta W/CM & Or W/O CM). Continue f/u with Dr. Laneta Simmers. Discussed aneurysm precautions today and outlined on AVS.  4. HTN - controlled, no changes made today. He is having labs with PCP as outlined above.  5. Mild intermittent LE edema -  suspect related to venous insufficiency and prolonged standing with concomitant amlodipine. No other sx of HF. Exam benign. We discussed updating echo since last study showed borderline low-normal EF 50-55%. He is in agreement. Will pursue.     Disposition: F/u with Dr. Clifton James in 1 year.   Medication Adjustments/Labs and Tests Ordered: Current medicines are reviewed at length with the patient today.  Concerns regarding medicines are outlined above. Medication changes, Labs and Tests ordered today are summarized above and listed in the Patient Instructions accessible in Encounters.   Signed, Laurann Montana, PA-C  05/17/2023 10:45 AM    [facility] Phone: [phone]; Fax: [phone]

## 2023-05-17 ENCOUNTER — Encounter: Payer: Self-pay | Admitting: Physician Assistant

## 2023-05-17 ENCOUNTER — Ambulatory Visit: Payer: 59 | Attending: Physician Assistant | Admitting: Physician Assistant

## 2023-05-17 ENCOUNTER — Ambulatory Visit (INDEPENDENT_AMBULATORY_CARE_PROVIDER_SITE_OTHER): Payer: 59 | Admitting: Family Medicine

## 2023-05-17 ENCOUNTER — Encounter: Payer: Self-pay | Admitting: Family Medicine

## 2023-05-17 VITALS — BP 131/79 | HR 73 | Temp 98.0°F | Ht 72.0 in | Wt 234.1 lb

## 2023-05-17 VITALS — BP 126/74 | HR 65 | Ht 72.0 in | Wt 234.0 lb

## 2023-05-17 DIAGNOSIS — Z1283 Encounter for screening for malignant neoplasm of skin: Secondary | ICD-10-CM

## 2023-05-17 DIAGNOSIS — Z125 Encounter for screening for malignant neoplasm of prostate: Secondary | ICD-10-CM | POA: Diagnosis not present

## 2023-05-17 DIAGNOSIS — R609 Edema, unspecified: Secondary | ICD-10-CM

## 2023-05-17 DIAGNOSIS — I77819 Aortic ectasia, unspecified site: Secondary | ICD-10-CM

## 2023-05-17 DIAGNOSIS — Z0000 Encounter for general adult medical examination without abnormal findings: Secondary | ICD-10-CM | POA: Diagnosis not present

## 2023-05-17 DIAGNOSIS — I2510 Atherosclerotic heart disease of native coronary artery without angina pectoris: Secondary | ICD-10-CM

## 2023-05-17 DIAGNOSIS — R7303 Prediabetes: Secondary | ICD-10-CM | POA: Diagnosis not present

## 2023-05-17 DIAGNOSIS — E785 Hyperlipidemia, unspecified: Secondary | ICD-10-CM

## 2023-05-17 DIAGNOSIS — I10 Essential (primary) hypertension: Secondary | ICD-10-CM

## 2023-05-17 LAB — COMPREHENSIVE METABOLIC PANEL
ALT: 40 U/L (ref 0–53)
AST: 30 U/L (ref 0–37)
Albumin: 4.5 g/dL (ref 3.5–5.2)
Alkaline Phosphatase: 47 U/L (ref 39–117)
BUN: 10 mg/dL (ref 6–23)
CO2: 29 mEq/L (ref 19–32)
Calcium: 9.4 mg/dL (ref 8.4–10.5)
Chloride: 99 mEq/L (ref 96–112)
Creatinine, Ser: 0.86 mg/dL (ref 0.40–1.50)
GFR: 93.79 mL/min (ref >60.00)
Glucose, Bld: 90 mg/dL (ref 70–99)
Potassium: 4 mEq/L (ref 3.5–5.1)
Sodium: 137 mEq/L (ref 135–145)
Total Bilirubin: 0.7 mg/dL (ref 0.2–1.2)
Total Protein: 7.4 g/dL (ref 6.0–8.3)

## 2023-05-17 LAB — LIPID PANEL
Cholesterol: 135 mg/dL (ref 0–200)
HDL: 43 mg/dL (ref >39.00)
LDL Cholesterol: 66 mg/dL (ref 0–99)
NonHDL: 92.19
Total CHOL/HDL Ratio: 3
Triglycerides: 133 mg/dL (ref 0.0–149.0)
VLDL: 26.6 mg/dL (ref 0.0–40.0)

## 2023-05-17 LAB — CBC
HCT: 46.4 % (ref 39.0–52.0)
Hemoglobin: 15.7 g/dL (ref 13.0–17.0)
MCHC: 33.7 g/dL (ref 30.0–36.0)
MCV: 91.5 fl (ref 78.0–100.0)
Platelets: 243 10*3/uL (ref 150.0–400.0)
RBC: 5.07 Mil/uL (ref 4.22–5.81)
RDW: 12.8 % (ref 11.5–15.5)
WBC: 7.3 10*3/uL (ref 4.0–10.5)

## 2023-05-17 LAB — PSA: PSA: 2.69 ng/mL (ref 0.10–4.00)

## 2023-05-17 LAB — HEMOGLOBIN A1C: Hgb A1c MFr Bld: 5.8 % (ref 4.6–6.5)

## 2023-05-17 NOTE — Patient Instructions (Addendum)
Give [place] 2-3 business days to get the results of your labs back.   Keep the diet clean and stay active.  Aim to do some physical exertion for 150 minutes per week. This is typically divided into 5 days per week, 30 minutes per day. The activity should be enough to get your heart rate up. Anything is better than nothing if you have time constraints.  If you do not hear anything about your referral in the next 1-2 weeks, call our office and ask for an update.  Please get me a copy of your advanced directive form at your convenience.   The Shingrix vaccine (for shingles) is a 2 shot series spaced 2-6 months apart. It can make people feel low energy, achy and almost like they have the flu for 48 hours after injection. 1/5 people can have nausea and/or vomiting. Please plan accordingly when deciding on when to get this shot. Call our office for a nurse visit appointment to get this. The second shot of the series is less severe regarding the side effects, but it still lasts 48 hours.   Let us know if you need anything.

## 2023-05-17 NOTE — Patient Instructions (Addendum)
Medication Instructions:  Your physician recommends that you continue on your current medications as directed. Please refer to the Current Medication list given to you today.  *If you need a refill on your cardiac medications before your next appointment, please call your pharmacy*    Testing/Procedures: ECHO Your physician has requested that you have an echocardiogram. Echocardiography is a painless test that uses sound waves to create images of your heart. It provides your doctor with information about the size and shape of your heart and how well your heart's chambers and valves are working. This procedure takes approximately one hour. There are no restrictions for this procedure. Please do NOT wear cologne, perfume, aftershave, or lotions (deodorant is allowed). Please arrive 15 minutes prior to your appointment time.    Follow-Up: At [facility], you and your health needs are our priority.  As part of our continuing mission to provide you with exceptional heart care, we have created designated Provider Care Teams.  These Care Teams include your primary Cardiologist (physician) and Advanced Practice Providers (APPs -  Physician Assistants and Nurse Practitioners) who all work together to provide you with the care you need, when you need it.   Your next appointment:   1 year(s)  Provider:   Verne Carrow, MD     Other Instructions   Information About Your Aneurysm  One of your tests has shown an aneurysm of your aorta. The word "aneurysm" refers to a bulge in an artery (blood vessel). Most people think of them in the context of an emergency, but yours was found incidentally. At this point there is nothing you need to do from a procedure standpoint, but there are some important things to keep in mind for day-to-day life.  Mainstays of therapy for aneurysms include very good blood pressure control, healthy lifestyle, and avoiding tobacco products and street drugs.  Research has raised concern that antibiotics in the fluoroquinolone class could be associated with increased risk of having an aneurysm develop or tear. This includes medicines that end in "floxacin," like Cipro or Levaquin. Make sure to discuss this information with other healthcare providers if you require antibiotics.  Since aneurysms can run in families, you should discuss your diagnosis with first degree relatives as they may need to be screened for this. Regular mild-moderate physical exercise is important, but avoid heavy lifting/weight lifting over 30lbs, chopping wood, shoveling snow or digging heavy earth with a shovel. It is best to avoid activities that cause grunting or straining (medically referred to as a "Valsalva maneuver"). This happens when a person bears down against a closed throat to increase the strength of arm or abdominal muscles. There's often a tendency to do this when lifting heavy weights, doing sit-ups, push-ups or chin-ups, etc., but it may be harmful.  This is a finding I would expect to be monitored periodically by your cardiology team. Most unruptured thoracic aortic aneurysms cause no symptoms, so they are often found during exams for other conditions. Contact a health care provider if you develop any discomfort in your upper back, neck, abdomen, trouble swallowing, cough or hoarseness, or unexplained weight loss. Get help right away if you develop severe pain in your upper back or abdomen that may move into your chest and arms, or any other concerning symptoms such as shortness of breath or fever.

## 2023-05-17 NOTE — Progress Notes (Signed)
Chief Complaint  Patient presents with   Annual Exam    Well Male Phillip [name] is here for a complete physical.   His last physical was >1 year ago.  Current diet: in general, a "healthy" diet.  Current exercise: active in the yard Weight trend: stable Fatigue out of ordinary? No. Seat belt? Yes.   Advanced directive? No  Health maintenance Shingrix- No Colonoscopy- Yes Tetanus- Yes HIV- Yes Hep C- Yes   Past Medical History:  Diagnosis Date   Allergy    Arthritis    Asthma    Followed by Pulmonology   CAD (coronary artery disease)    Chicken pox    Fistula    GERD (gastroesophageal reflux disease)    Hyperlipidemia    Hypertension    Thoracic aortic aneurysm [facility])     Past Surgical History:  Procedure Laterality Date   BOWEL RESECTION N/A 08/19/2021   Procedure: ROBOTIC LOW ANTERIOR BOWEL RESECTION;  Surgeon: Andria Meuse, MD;  Location: [facility];  Service: General;  Laterality: N/A;   COLONOSCOPY     CYSTOSCOPY     to check bladder   DENTAL SURGERY     ESOPHAGOGASTRODUODENOSCOPY     FLEXIBLE SIGMOIDOSCOPY N/A 08/19/2021   Procedure: FLEXIBLE SIGMOIDOSCOPY;  Surgeon: Andria Meuse, MD;  Location: [facility];  Service: General;  Laterality: N/A;   LEFT HEART CATH AND CORONARY ANGIOGRAPHY N/A 12/06/2018   Procedure: LEFT HEART CATH AND CORONARY ANGIOGRAPHY;  Surgeon: Kathleene Hazel, MD;  Location: [facility];  Service: Cardiovascular;  Laterality: N/A;   LEG SURGERY Left 2007   MASTECTOMY     WISDOM TOOTH EXTRACTION      Medications  Current Outpatient Medications on File Prior to Visit  Medication Sig Dispense Refill   albuterol (VENTOLIN HFA) 108 (90 Base) MCG/ACT inhaler Inhale 2 puffs into the lungs every 6 (six) hours as needed. 18 g 4   amLODipine (NORVASC) 5 MG tablet Take 1 tablet (5 mg total) by mouth daily. 90 tablet 3   aspirin EC 81 MG tablet Take 1 tablet (81 mg total) by mouth daily. 90 tablet 3   cetirizine  (ZYRTEC) 10 MG tablet Take 10 mg by mouth daily.     fluticasone (FLOVENT HFA) 110 MCG/ACT inhaler Inhale 2 puffs into the lungs in the morning and at bedtime. 12 g 6   metoprolol succinate (TOPROL-XL) 25 MG 24 hr tablet Take 1 tablet (25 mg total) by mouth daily. 90 tablet 3   Omeprazole 20 MG TBEC Take 20 mg by mouth every other day.     rosuvastatin (CRESTOR) 40 MG tablet Take 1 tablet (40 mg total) by mouth daily. 90 tablet 3   sildenafil (VIAGRA) 100 MG tablet Take 1 tablet (100 mg total) by mouth daily. Please call [phone] to schedule an appointment for future refills. Thank you. Final attempt. 10 tablet 0    Allergies Allergies  Allergen Reactions   Bactrim [Sulfamethoxazole-Trimethoprim] Rash    Joint pain   Penicillins Rash    DID THE REACTION INVOLVE: Swelling of the face/tongue/throat, SOB, or low BP? No Sudden or severe rash/hives, skin peeling, or the inside of the mouth or nose? No Did it require medical treatment? No When did it last happen? Childhood allergy If all above answers are "NO", may proceed with cephalosporin use.     Family History Family History  Problem Relation Age of Onset   Hypertension Mother  Controlled with diet   Aortic aneurysm Mother    Hiatal hernia Father    Emphysema Maternal Uncle    Esophageal cancer Paternal Grandmother    Hodgkin's lymphoma Child        Remission   Stomach cancer Other    Rectal cancer Other    Colon cancer Other    Colon polyps Neg Hx     Review of Systems: Constitutional:  no fevers Eye:  no recent significant change in vision Ear/Nose/Mouth/Throat:  Ears:  no hearing loss Nose/Mouth/Throat:  no complaints of nasal congestion, no sore throat Cardiovascular:  no chest pain Respiratory:  no shortness of breath Gastrointestinal:  no change in bowel habits GU:  Male: negative for dysuria, frequency Musculoskeletal/Extremities:  no joint pain Integumentary (Skin/Breast):  no abnormal skin lesions  reported Neurologic:  no headaches Endocrine: No unexpected weight changes Hematologic/Lymphatic:  no abnormal bleeding  Exam BP 131/79 (BP Location: Left Arm, Patient Position: Sitting, Cuff Size: Normal)   Pulse 73   Temp 98 F (36.7 C) (Oral)   Ht 6' (1.829 m)   Wt 234 lb 2 oz (106.2 kg)   SpO2 94%   BMI 31.75 kg/m  General:  well developed, well nourished, in no apparent distress Skin:  no significant moles, warts, or growths Head:  no masses, lesions, or tenderness Eyes:  pupils equal and round, sclera anicteric without injection Ears:  canals without lesions, TMs shiny without retraction, no obvious effusion, no erythema Nose:  nares patent, mucosa normal Throat/Pharynx:  lips and gingiva without lesion; tongue and uvula midline; non-inflamed pharynx; no exudates or postnasal drainage Neck: neck supple without adenopathy, thyromegaly, or masses Cardiac: RRR, no bruits, no LE edema Lungs:  clear to auscultation, breath sounds equal bilaterally, no respiratory distress Abdomen: BS+, soft, non-tender, non-distended, no masses or organomegaly noted Rectal: Deferred Musculoskeletal:  symmetrical muscle groups noted without atrophy or deformity Neuro:  gait normal; deep tendon reflexes normal and symmetric Psych: well oriented with normal range of affect and appropriate judgment/insight  Assessment and Plan  Well adult exam - Plan: CBC, Comprehensive metabolic panel, Lipid panel  Prediabetes - Plan: Hemoglobin A1c  Screening for prostate cancer - Plan: PSA  Skin cancer screening - Plan: Ambulatory referral to Dermatology   Well 62 y.o. male. Counseled on diet and exercise. Counseled on risks and benefits of prostate cancer screening with PSA. The patient agrees to undergo testing. Shingrix rec'd. Advanced directive form provided today.  Immunizations, labs, and further orders as above. Follow up in 1 yr or prn. The patient voiced understanding and agreement to the  plan.  Jilda Roche [place], DO 05/17/23 1:13 PM

## 2023-05-25 ENCOUNTER — Other Ambulatory Visit: Payer: 59

## 2023-05-25 ENCOUNTER — Ambulatory Visit: Payer: 59 | Admitting: Surgery

## 2023-05-30 ENCOUNTER — Ambulatory Visit
Admission: RE | Admit: 2023-05-30 | Discharge: 2023-05-30 | Disposition: A | Discharge status: Home / Self Care | Payer: 59 | Source: Ambulatory Visit | Attending: Surgery | Admitting: Surgery

## 2023-05-30 DIAGNOSIS — I712 Thoracic aortic aneurysm, without rupture, unspecified: Secondary | ICD-10-CM | POA: Diagnosis not present

## 2023-05-30 DIAGNOSIS — I7121 Aneurysm of the ascending aorta, without rupture: Secondary | ICD-10-CM

## 2023-05-30 MED ORDER — IOPAMIDOL (ISOVUE-370) INJECTION 76%
75.0000 mL | Freq: Once | INTRAVENOUS | Status: AC | PRN
  Administered 2023-05-30: 75 mL via INTRAVENOUS

## 2023-06-01 ENCOUNTER — Ambulatory Visit: Payer: 59 | Admitting: Surgery

## 2023-06-01 ENCOUNTER — Encounter: Payer: Self-pay | Admitting: Surgery

## 2023-06-01 VITALS — BP 145/90 | HR 79 | Resp 20 | Ht 72.0 in | Wt 234.0 lb

## 2023-06-01 DIAGNOSIS — I7121 Aneurysm of the ascending aorta, without rupture: Secondary | ICD-10-CM

## 2023-06-01 NOTE — Progress Notes (Signed)
    HPI: ***  Current Outpatient Medications  Medication Sig Dispense Refill   albuterol (VENTOLIN HFA) 108 (90 Base) MCG/ACT inhaler Inhale 2 puffs into the lungs every 6 (six) hours as needed. 18 g 4   amLODipine (NORVASC) 5 MG tablet Take 1 tablet (5 mg total) by mouth daily. 90 tablet 3   aspirin EC 81 MG tablet Take 1 tablet (81 mg total) by mouth daily. 90 tablet 3   cetirizine (ZYRTEC) 10 MG tablet Take 10 mg by mouth daily.     fluticasone (FLOVENT HFA) 110 MCG/ACT inhaler Inhale 2 puffs into the lungs in the morning and at bedtime. 12 g 6   metoprolol succinate (TOPROL-XL) 25 MG 24 hr tablet Take 1 tablet (25 mg total) by mouth daily. 90 tablet 3   Omeprazole 20 MG TBEC Take 20 mg by mouth every other day.     rosuvastatin (CRESTOR) 40 MG tablet Take 1 tablet (40 mg total) by mouth daily. 90 tablet 3   sildenafil (VIAGRA) 100 MG tablet Take 1 tablet (100 mg total) by mouth daily. Please call [phone] to schedule an appointment for future refills. Thank you. Final attempt. 10 tablet 0   No current facility-administered medications for this visit.     Physical Exam: ***  Diagnostic Tests: ***  Impression: ***  Plan: ***   Alleen Borne, MD [facility] [phone]

## 2023-06-08 ENCOUNTER — Ambulatory Visit (HOSPITAL_COMMUNITY): Payer: 59 | Attending: Internal Medicine

## 2023-06-08 DIAGNOSIS — R609 Edema, unspecified: Secondary | ICD-10-CM | POA: Diagnosis not present

## 2023-06-08 DIAGNOSIS — E785 Hyperlipidemia, unspecified: Secondary | ICD-10-CM

## 2023-06-08 DIAGNOSIS — I719 Aortic aneurysm of unspecified site, without rupture: Secondary | ICD-10-CM | POA: Diagnosis not present

## 2023-06-08 DIAGNOSIS — I10 Essential (primary) hypertension: Secondary | ICD-10-CM | POA: Diagnosis not present

## 2023-06-08 DIAGNOSIS — I77819 Aortic ectasia, unspecified site: Secondary | ICD-10-CM

## 2023-06-08 DIAGNOSIS — I2510 Atherosclerotic heart disease of native coronary artery without angina pectoris: Secondary | ICD-10-CM

## 2023-06-08 LAB — ECHOCARDIOGRAM COMPLETE
Area-P 1/2: 3.42 cm2
S' Lateral: 3.5 cm

## 2023-06-13 ENCOUNTER — Other Ambulatory Visit (HOSPITAL_BASED_OUTPATIENT_CLINIC_OR_DEPARTMENT_OTHER): Payer: Self-pay

## 2023-07-15 ENCOUNTER — Telehealth: Payer: Self-pay | Admitting: Pulmonary Disease

## 2023-07-15 NOTE — Telephone Encounter (Signed)
Okay to have patient follow-up with Rhunette Croft on 27th

## 2023-07-15 NOTE — Telephone Encounter (Signed)
Scheduled patient to see Katie on 8/27. Unfortunately, you are booked until October. Let us know if youd prefer to see him yourself, and how you would prefer to double book. Otherwise, he is set to see Florentina Addison!

## 2023-07-15 NOTE — Telephone Encounter (Signed)
Schedule patient for follow-up appointment to review recent CT

## 2023-07-18 ENCOUNTER — Other Ambulatory Visit (HOSPITAL_BASED_OUTPATIENT_CLINIC_OR_DEPARTMENT_OTHER): Payer: Self-pay

## 2023-07-18 ENCOUNTER — Other Ambulatory Visit: Payer: Self-pay | Admitting: Nurse Practitioner

## 2023-07-18 ENCOUNTER — Other Ambulatory Visit: Payer: Self-pay

## 2023-07-18 ENCOUNTER — Other Ambulatory Visit: Payer: Self-pay | Admitting: Cardiovascular Disease

## 2023-07-18 MED ORDER — FLUTICASONE PROPIONATE HFA 110 MCG/ACT IN AERO
2.0000 | INHALATION_SPRAY | Freq: Two times a day (BID) | RESPIRATORY_TRACT | 6 refills | Status: DC
  Filled 2023-07-18: qty 12, 30d supply, fill #0
  Filled 2023-08-30: qty 12, 30d supply, fill #1
  Filled 2023-10-06: qty 12, 30d supply, fill #2
  Filled 2023-11-04: qty 12, 30d supply, fill #3
  Filled 2023-12-12 (×2): qty 12, 30d supply, fill #4
  Filled 2024-01-12: qty 12, 30d supply, fill #5
  Filled 2024-02-12: qty 12, 30d supply, fill #6

## 2023-07-19 ENCOUNTER — Other Ambulatory Visit (HOSPITAL_BASED_OUTPATIENT_CLINIC_OR_DEPARTMENT_OTHER): Payer: Self-pay

## 2023-07-19 MED ORDER — SILDENAFIL CITRATE 100 MG PO TABS
100.0000 mg | ORAL_TABLET | Freq: Every day | ORAL | 1 refills | Status: DC
  Filled 2023-07-19: qty 6, 30d supply, fill #0
  Filled 2023-08-30: qty 6, 30d supply, fill #1
  Filled 2023-10-06: qty 6, 30d supply, fill #2
  Filled 2023-12-12: qty 2, 10d supply, fill #3

## 2023-07-19 NOTE — Telephone Encounter (Signed)
Pt is requesting a refill on sildenafil. Would Dr. Clifton James like to refill this medication? Please address

## 2023-07-26 ENCOUNTER — Ambulatory Visit: Payer: 59 | Admitting: Nurse Practitioner

## 2023-07-27 ENCOUNTER — Encounter: Payer: Self-pay | Admitting: Pulmonary Disease

## 2023-07-27 ENCOUNTER — Ambulatory Visit (INDEPENDENT_AMBULATORY_CARE_PROVIDER_SITE_OTHER): Payer: 59 | Admitting: Pulmonary Disease

## 2023-07-27 VITALS — BP 136/88 | HR 89 | Temp 97.5°F | Ht 72.0 in | Wt 238.0 lb

## 2023-07-27 DIAGNOSIS — R053 Chronic cough: Secondary | ICD-10-CM

## 2023-07-27 LAB — C-REACTIVE PROTEIN: CRP: <1 mg/dL (ref 0.5–20.0)

## 2023-07-27 LAB — SEDIMENTATION RATE: Sed Rate: 16 mm/hr (ref 0–20)

## 2023-07-27 NOTE — Patient Instructions (Addendum)
CT scan of the chest without contrast in about 4 weeks  I will see you in about 5 to 6 weeks from now  Blood work for ESR, CRP, ANA  Call with significant concerns

## 2023-07-27 NOTE — Progress Notes (Signed)
Subjective:    Patient ID: Phillip [name], male    DOB: [birth year], 62 y.o.   MRN: [id number]  Patient with a history of allergic rhinitis asthma  HPI   Was recently seen in the office for his asthma Has been doing relatively well He does have a chronic cough  History of hypertension, coronary artery disease, thoracic aortic aneurysm  He was asked to come in today for evaluation of his CT scan he had a CT scan done showing nodular changes at the base on the left side which progressed from a prior CT and nodular changes in the right upper lobe  Denies any symptoms of significant aspiration Does have reflux for which he is on medications No fevers, no chills, no night sweats, no weight loss  Does not have any acute symptoms to suggest a respiratory infection at present  He does use Zyrtec, albuterol His health is unchanged from recent  Past Medical History:  Diagnosis Date   Allergy    Arthritis    Asthma    Followed by Pulmonology   CAD (coronary artery disease)    Chicken pox    Fistula    GERD (gastroesophageal reflux disease)    Hyperlipidemia    Hypertension    Thoracic aortic aneurysm (HCC)       Review of Systems  Constitutional:  Negative for unexpected weight change.  HENT:         Allergies  Respiratory:  Positive for cough. Negative for shortness of breath.        History of asthma       Objective:   Physical Exam Constitutional:      Appearance: He is obese.  HENT:     Head: Atraumatic.     Nose: No congestion or rhinorrhea.  Eyes:     General:        Right eye: No discharge.        Left eye: No discharge.  Cardiovascular:     Rate and Rhythm: Normal rate and regular rhythm.     Heart sounds: No murmur heard.    No friction rub.  Pulmonary:     Effort: No respiratory distress.     Breath sounds: No stridor. Rales present. No wheezing.  Musculoskeletal:     Cervical back: No rigidity or tenderness.  Neurological:     Mental  Status: He is alert.  Psychiatric:        Mood and Affect: Mood normal.    Vitals:   07/27/23 1016 07/27/23 1022  BP: (!) 142/94 136/88  Pulse: 89   Temp: (!) 97.5 F (36.4 C)   TempSrc: Temporal   SpO2: 94%   Weight: 238 lb (108 kg)   Height: 6' (1.829 m)   [facility]    Component Value Date/Time   WBC 7.3 05/17/2023 1317   RBC 5.07 05/17/2023 1317   HGB 15.7 05/17/2023 1317   HGB 15.1 12/01/2018 0735   HCT 46.4 05/17/2023 1317   HCT 43.7 12/01/2018 0735   PLT 243.0 05/17/2023 1317   PLT 266 12/01/2018 0735   MCV 91.5 05/17/2023 1317   MCV 88 12/01/2018 0735   MCH 30.4 08/21/2021 0402   MCHC 33.7 05/17/2023 1317   RDW 12.8 05/17/2023 1317   RDW 12.9 12/01/2018 0735   LYMPHSABS 1.9 07/31/2021 1454   MONOABS 0.7 07/31/2021 1454   EOSABS 0.4 07/31/2021 1454   BASOSABS 0.1 07/31/2021 1454    CT scan findings reviewed with the  patient showing nodular changes in the right upper lobe and also progressive changes infiltrative nodular change at the left base, this current CT was compared with one performed last year  Assessment & Plan:   Abnormal CT scan of the chest with progressive changes at the base of the lung concern for Infectious versus inflammatory process  As he has not been having any acute symptoms suggesting Mannam infection, antibiotics not indicated at present  Bronchoscopy was discussed with patient as an option of evaluation  He does have a cough with minimal phlegm production, probably not enough samples to test for Mycobacterium in the present situation  Plan will be to repeat his CT scan in about 4 weeks which will make about 3 months from previous, if there is progressive findings, a bronchoscopy will definitely be an option of further evaluation  Will order ANA, ESR, CRP to evaluate for chronic inflammation  I will see him back in about 5 to 6 weeks to review the CT  Encouraged to give [place] a call with any significant concerns  Mycobacterium infection was  discussed as a possibility  Chronic aspiration is less likely, acute infectious process is less likely as well

## 2023-08-03 LAB — ANA W/REFLEX: ANA Titer 1: NEGATIVE

## 2023-08-12 ENCOUNTER — Ambulatory Visit
Admission: RE | Admit: 2023-08-12 | Discharge: 2023-08-12 | Disposition: A | Discharge status: Home / Self Care | Payer: 59 | Source: Ambulatory Visit | Attending: Pulmonary Disease | Admitting: Pulmonary Disease

## 2023-08-12 DIAGNOSIS — R053 Chronic cough: Secondary | ICD-10-CM

## 2023-08-12 DIAGNOSIS — I700 Atherosclerosis of aorta: Secondary | ICD-10-CM | POA: Diagnosis not present

## 2023-08-12 DIAGNOSIS — R918 Other nonspecific abnormal finding of lung field: Secondary | ICD-10-CM | POA: Diagnosis not present

## 2023-08-17 ENCOUNTER — Other Ambulatory Visit (HOSPITAL_COMMUNITY): Payer: Self-pay

## 2023-09-02 ENCOUNTER — Other Ambulatory Visit (HOSPITAL_BASED_OUTPATIENT_CLINIC_OR_DEPARTMENT_OTHER): Payer: Self-pay

## 2023-09-02 MED ORDER — COVID-19 MRNA VAC-TRIS(PFIZER) 30 MCG/0.3ML IM SUSY
0.3000 mL | PREFILLED_SYRINGE | Freq: Once | INTRAMUSCULAR | 0 refills | Status: AC
  Filled 2023-09-02: qty 0.3, 1d supply, fill #0

## 2023-09-02 MED ORDER — INFLUENZA VIRUS VACC SPLIT PF (FLUZONE) 0.5 ML IM SUSY
0.5000 mL | PREFILLED_SYRINGE | Freq: Once | INTRAMUSCULAR | 0 refills | Status: AC
  Filled 2023-09-02: qty 0.5, 1d supply, fill #0

## 2023-10-06 ENCOUNTER — Other Ambulatory Visit: Payer: Self-pay

## 2023-10-06 ENCOUNTER — Other Ambulatory Visit: Payer: Self-pay | Admitting: Cardiovascular Disease

## 2023-10-06 ENCOUNTER — Other Ambulatory Visit (HOSPITAL_BASED_OUTPATIENT_CLINIC_OR_DEPARTMENT_OTHER): Payer: Self-pay

## 2023-10-06 MED ORDER — ROSUVASTATIN CALCIUM 40 MG PO TABS
40.0000 mg | ORAL_TABLET | Freq: Every day | ORAL | 1 refills | Status: DC
  Filled 2023-10-06 – 2023-12-12 (×4): qty 90, 90d supply, fill #0
  Filled 2024-03-15: qty 90, 90d supply, fill #1

## 2023-10-06 MED ORDER — AMLODIPINE BESYLATE 5 MG PO TABS
5.0000 mg | ORAL_TABLET | Freq: Every day | ORAL | 1 refills | Status: DC
  Filled 2023-10-06: qty 90, 90d supply, fill #0
  Filled 2023-11-04 – 2024-02-12 (×2): qty 90, 90d supply, fill #1

## 2023-10-13 ENCOUNTER — Encounter: Payer: Self-pay | Admitting: Pulmonary Disease

## 2023-11-04 ENCOUNTER — Other Ambulatory Visit (HOSPITAL_BASED_OUTPATIENT_CLINIC_OR_DEPARTMENT_OTHER): Payer: Self-pay

## 2023-11-04 ENCOUNTER — Other Ambulatory Visit: Payer: Self-pay

## 2023-11-04 ENCOUNTER — Other Ambulatory Visit: Payer: Self-pay | Admitting: Cardiovascular Disease

## 2023-11-04 MED ORDER — METOPROLOL SUCCINATE ER 25 MG PO TB24
25.0000 mg | ORAL_TABLET | Freq: Every day | ORAL | 3 refills | Status: DC
  Filled 2023-11-04 – 2024-01-12 (×3): qty 90, 90d supply, fill #0
  Filled 2024-04-13: qty 90, 90d supply, fill #1
  Filled 2024-05-11 – 2024-06-26 (×3): qty 90, 90d supply, fill #2
  Filled 2024-07-09: qty 90, 90d supply, fill #3

## 2023-11-25 ENCOUNTER — Telehealth (INDEPENDENT_AMBULATORY_CARE_PROVIDER_SITE_OTHER): Payer: 59 | Admitting: Pulmonary Disease

## 2023-11-25 ENCOUNTER — Other Ambulatory Visit (HOSPITAL_BASED_OUTPATIENT_CLINIC_OR_DEPARTMENT_OTHER): Payer: Self-pay

## 2023-11-25 DIAGNOSIS — R053 Chronic cough: Secondary | ICD-10-CM

## 2023-11-25 DIAGNOSIS — J479 Bronchiectasis, uncomplicated: Secondary | ICD-10-CM | POA: Diagnosis not present

## 2023-11-25 MED ORDER — BENZONATATE 200 MG PO CAPS
200.0000 mg | ORAL_CAPSULE | Freq: Three times a day (TID) | ORAL | 1 refills | Status: DC | PRN
  Filled 2023-11-25: qty 90, 30d supply, fill #0
  Filled 2024-01-12: qty 90, 30d supply, fill #1

## 2023-11-25 NOTE — Patient Instructions (Signed)
Tessalon Perles called to pharmacy for you  We will follow-up with the CAT scan that you have scheduled for June/July to see what is going on with a haziness at the bottom part of the lungs  Call us with significant concerns  We can consider bronchoscopy if there is any worsening of your symptoms

## 2023-11-26 NOTE — Progress Notes (Signed)
Virtual Visit via Video Note  I connected with Phillip [name] on 11/26/23 at  1:15 PM EST by a video enabled telemedicine application and verified that I am speaking with the correct person using two identifiers.  Location: Patient: Patient at home Provider: In office at [employer].   I discussed the limitations of evaluation and management by telemedicine and the availability of in person appointments. The patient expressed understanding and agreed to proceed.  History of Present Illness: Patient with bronchiectasis, abnormal CT scan of the chest with a chronic cough History of asthmatic bronchitis  Symptoms have been stable  Still does have a chronic cough, not really bringing up a lot of secretions at present  No chest pains or chest discomfort No chronic fatigue, no weight loss, no night sweats   Observations/Objective: Patient does appear comfortable does not appear to be in extremities  Recent CT scan reviewed showing nodular changes at the bases bilaterally, evidence of bronchiectasis  Assessment and Plan: Bronchiectasis with no significant exacerbation of present  Chronic cough -Call in prescription for Tessalon Perles 200 to be used up to 3 times a day as needed for cough  He does have a follow-up CT scan in June/July-will follow-up on nodular changes at the bases  Bronchoscopy had been discussed previously as a means of further workup for bronchiectasis  Follow Up Instructions: Follow-up in 6 months   I discussed the assessment and treatment plan with the patient. The patient was provided an opportunity to ask questions and all were answered. The patient agreed with the plan and demonstrated an understanding of the instructions.   The patient was advised to call back or seek an in-person evaluation if the symptoms worsen or if the condition fails to improve as anticipated.  I provided 20 minutes of non-face-to-face time during this  encounter.   Tomma Lightning, MD

## 2023-11-26 NOTE — Progress Notes (Deleted)
.  vir

## 2023-12-12 ENCOUNTER — Other Ambulatory Visit: Payer: Self-pay | Admitting: Cardiovascular Disease

## 2023-12-12 ENCOUNTER — Other Ambulatory Visit (HOSPITAL_BASED_OUTPATIENT_CLINIC_OR_DEPARTMENT_OTHER): Payer: Self-pay

## 2023-12-13 ENCOUNTER — Other Ambulatory Visit (HOSPITAL_BASED_OUTPATIENT_CLINIC_OR_DEPARTMENT_OTHER): Payer: Self-pay

## 2023-12-13 MED ORDER — SILDENAFIL CITRATE 100 MG PO TABS
100.0000 mg | ORAL_TABLET | Freq: Every day | ORAL | 1 refills | Status: DC
  Filled 2023-12-13 – 2024-01-12 (×2): qty 10, 10d supply, fill #0
  Filled 2024-02-12: qty 10, 10d supply, fill #1

## 2023-12-23 ENCOUNTER — Other Ambulatory Visit (HOSPITAL_BASED_OUTPATIENT_CLINIC_OR_DEPARTMENT_OTHER): Payer: Self-pay

## 2024-01-12 ENCOUNTER — Other Ambulatory Visit (HOSPITAL_BASED_OUTPATIENT_CLINIC_OR_DEPARTMENT_OTHER): Payer: Self-pay

## 2024-02-13 ENCOUNTER — Other Ambulatory Visit (HOSPITAL_BASED_OUTPATIENT_CLINIC_OR_DEPARTMENT_OTHER): Payer: Self-pay

## 2024-02-27 DIAGNOSIS — L570 Actinic keratosis: Secondary | ICD-10-CM | POA: Diagnosis not present

## 2024-02-27 DIAGNOSIS — L814 Other melanin hyperpigmentation: Secondary | ICD-10-CM | POA: Diagnosis not present

## 2024-02-27 DIAGNOSIS — D225 Melanocytic nevi of trunk: Secondary | ICD-10-CM | POA: Diagnosis not present

## 2024-02-27 DIAGNOSIS — Z129 Encounter for screening for malignant neoplasm, site unspecified: Secondary | ICD-10-CM | POA: Diagnosis not present

## 2024-03-15 ENCOUNTER — Other Ambulatory Visit: Payer: Self-pay | Admitting: Cardiovascular Disease

## 2024-03-15 ENCOUNTER — Other Ambulatory Visit: Payer: Self-pay | Admitting: Nurse Practitioner

## 2024-03-16 ENCOUNTER — Other Ambulatory Visit: Payer: Self-pay

## 2024-03-16 NOTE — Telephone Encounter (Signed)
 Dr. Jeneal Mins Pt. As this is not a Cardiac RX, Does Dr. Abel Hoe want to refill? Please Advise

## 2024-03-17 ENCOUNTER — Other Ambulatory Visit (HOSPITAL_BASED_OUTPATIENT_CLINIC_OR_DEPARTMENT_OTHER): Payer: Self-pay

## 2024-03-17 MED ORDER — FLUTICASONE PROPIONATE HFA 110 MCG/ACT IN AERO
2.0000 | INHALATION_SPRAY | Freq: Two times a day (BID) | RESPIRATORY_TRACT | 6 refills | Status: DC
  Filled 2024-03-17: qty 12, 30d supply, fill #0
  Filled 2024-04-13: qty 12, 30d supply, fill #1
  Filled 2024-05-11: qty 12, 30d supply, fill #2
  Filled 2024-06-18: qty 12, 30d supply, fill #3
  Filled 2024-07-16: qty 12, 30d supply, fill #4
  Filled 2024-08-13: qty 12, 30d supply, fill #5
  Filled 2024-09-03 – 2024-09-27 (×2): qty 12, 30d supply, fill #6

## 2024-03-19 ENCOUNTER — Other Ambulatory Visit (HOSPITAL_BASED_OUTPATIENT_CLINIC_OR_DEPARTMENT_OTHER): Payer: Self-pay

## 2024-03-19 ENCOUNTER — Other Ambulatory Visit: Payer: Self-pay

## 2024-03-20 ENCOUNTER — Other Ambulatory Visit (HOSPITAL_BASED_OUTPATIENT_CLINIC_OR_DEPARTMENT_OTHER): Payer: Self-pay

## 2024-03-20 MED ORDER — SILDENAFIL CITRATE 100 MG PO TABS
100.0000 mg | ORAL_TABLET | ORAL | 1 refills | Status: AC | PRN
  Filled 2024-03-20 – 2024-05-11 (×2): qty 10, 30d supply, fill #0
  Filled 2024-09-03: qty 10, 30d supply, fill #1

## 2024-04-02 ENCOUNTER — Other Ambulatory Visit (HOSPITAL_BASED_OUTPATIENT_CLINIC_OR_DEPARTMENT_OTHER): Payer: Self-pay

## 2024-04-13 ENCOUNTER — Other Ambulatory Visit (HOSPITAL_BASED_OUTPATIENT_CLINIC_OR_DEPARTMENT_OTHER): Payer: Self-pay

## 2024-04-13 ENCOUNTER — Other Ambulatory Visit: Payer: Self-pay | Admitting: Pulmonary Disease

## 2024-04-13 ENCOUNTER — Other Ambulatory Visit: Payer: Self-pay | Admitting: Cardiovascular Disease

## 2024-04-13 ENCOUNTER — Other Ambulatory Visit: Payer: Self-pay

## 2024-04-13 MED ORDER — ROSUVASTATIN CALCIUM 40 MG PO TABS
40.0000 mg | ORAL_TABLET | Freq: Every day | ORAL | 0 refills | Status: DC
  Filled 2024-04-13 – 2024-06-11 (×3): qty 30, 30d supply, fill #0

## 2024-04-13 MED ORDER — BENZONATATE 200 MG PO CAPS
200.0000 mg | ORAL_CAPSULE | Freq: Three times a day (TID) | ORAL | 1 refills | Status: DC | PRN
  Filled 2024-04-13: qty 90, 30d supply, fill #0
  Filled 2024-05-11: qty 90, 30d supply, fill #1

## 2024-05-11 ENCOUNTER — Other Ambulatory Visit (HOSPITAL_BASED_OUTPATIENT_CLINIC_OR_DEPARTMENT_OTHER): Payer: Self-pay

## 2024-05-11 ENCOUNTER — Other Ambulatory Visit: Payer: Self-pay

## 2024-05-11 ENCOUNTER — Other Ambulatory Visit: Payer: Self-pay | Admitting: Cardiovascular Disease

## 2024-05-11 ENCOUNTER — Other Ambulatory Visit: Payer: Self-pay | Admitting: Surgery

## 2024-05-11 DIAGNOSIS — I7121 Aneurysm of the ascending aorta, without rupture: Secondary | ICD-10-CM

## 2024-05-11 MED ORDER — AMLODIPINE BESYLATE 5 MG PO TABS
5.0000 mg | ORAL_TABLET | Freq: Every day | ORAL | 0 refills | Status: DC
  Filled 2024-05-11: qty 30, 30d supply, fill #0

## 2024-06-07 ENCOUNTER — Other Ambulatory Visit: Payer: Self-pay | Admitting: Cardiovascular Disease

## 2024-06-08 ENCOUNTER — Other Ambulatory Visit (HOSPITAL_BASED_OUTPATIENT_CLINIC_OR_DEPARTMENT_OTHER): Payer: Self-pay

## 2024-06-08 MED ORDER — AMLODIPINE BESYLATE 5 MG PO TABS
5.0000 mg | ORAL_TABLET | Freq: Every day | ORAL | 0 refills | Status: DC
  Filled 2024-06-08: qty 30, 30d supply, fill #0

## 2024-06-11 ENCOUNTER — Other Ambulatory Visit: Payer: Self-pay

## 2024-06-11 ENCOUNTER — Other Ambulatory Visit (HOSPITAL_BASED_OUTPATIENT_CLINIC_OR_DEPARTMENT_OTHER): Payer: Self-pay

## 2024-06-13 ENCOUNTER — Encounter: Payer: Self-pay | Admitting: Family Medicine

## 2024-06-13 ENCOUNTER — Ambulatory Visit: Payer: Self-pay | Admitting: Family Medicine

## 2024-06-13 ENCOUNTER — Ambulatory Visit (INDEPENDENT_AMBULATORY_CARE_PROVIDER_SITE_OTHER): Admitting: Family Medicine

## 2024-06-13 VITALS — BP 130/82 | HR 70 | Temp 98.0°F | Resp 16 | Ht 73.0 in | Wt 231.0 lb

## 2024-06-13 DIAGNOSIS — Z125 Encounter for screening for malignant neoplasm of prostate: Secondary | ICD-10-CM

## 2024-06-13 DIAGNOSIS — Z0000 Encounter for general adult medical examination without abnormal findings: Secondary | ICD-10-CM | POA: Diagnosis not present

## 2024-06-13 DIAGNOSIS — Z23 Encounter for immunization: Secondary | ICD-10-CM | POA: Diagnosis not present

## 2024-06-13 DIAGNOSIS — R7303 Prediabetes: Secondary | ICD-10-CM

## 2024-06-13 LAB — COMPREHENSIVE METABOLIC PANEL WITH GFR
ALT: 32 U/L (ref 0–53)
AST: 24 U/L (ref 0–37)
Albumin: 4.7 g/dL (ref 3.5–5.2)
Alkaline Phosphatase: 47 U/L (ref 39–117)
BUN: 11 mg/dL (ref 6–23)
CO2: 33 meq/L — ABNORMAL HIGH (ref 19–32)
Calcium: 9.4 mg/dL (ref 8.4–10.5)
Chloride: 100 meq/L (ref 96–112)
Creatinine, Ser: 0.89 mg/dL (ref 0.40–1.50)
GFR: 92.12 mL/min (ref >60.00)
Glucose, Bld: 104 mg/dL — ABNORMAL HIGH (ref 70–99)
Potassium: 4.2 meq/L (ref 3.5–5.1)
Sodium: 138 meq/L (ref 135–145)
Total Bilirubin: 0.6 mg/dL (ref 0.2–1.2)
Total Protein: 7.2 g/dL (ref 6.0–8.3)

## 2024-06-13 LAB — CBC
HCT: 47.6 % (ref 39.0–52.0)
Hemoglobin: 16 g/dL (ref 13.0–17.0)
MCHC: 33.6 g/dL (ref 30.0–36.0)
MCV: 90.8 fl (ref 78.0–100.0)
Platelets: 251 K/uL (ref 150.0–400.0)
RBC: 5.24 Mil/uL (ref 4.22–5.81)
RDW: 13 % (ref 11.5–15.5)
WBC: 5.2 K/uL (ref 4.0–10.5)

## 2024-06-13 LAB — HEMOGLOBIN A1C: Hgb A1c MFr Bld: 6 % (ref 4.6–6.5)

## 2024-06-13 LAB — PSA: PSA: 2.35 ng/mL (ref 0.10–4.00)

## 2024-06-13 LAB — LIPID PANEL
Cholesterol: 138 mg/dL (ref 0–200)
HDL: 38.3 mg/dL — ABNORMAL LOW (ref >39.00)
LDL Cholesterol: 81 mg/dL (ref 0–99)
NonHDL: 99.34
Total CHOL/HDL Ratio: 4
Triglycerides: 92 mg/dL (ref 0.0–149.0)
VLDL: 18.4 mg/dL (ref 0.0–40.0)

## 2024-06-13 NOTE — Patient Instructions (Addendum)
 Give [place] 2-3 business days to get the results of your labs back.   Keep the diet clean and stay active.  Please get me a copy of your advanced directive form at your convenience.   Take Metamucil or Benefiber daily.  Let us know if you need anything.

## 2024-06-13 NOTE — Progress Notes (Signed)
 Chief Complaint  Patient presents with   Annual Exam    CPE    Well Male Phillip [name] is here for a complete physical.   His last physical was >1 year ago.  Current diet: in general, a healthy diet.  Current exercise: walking Weight trend: stable Fatigue out of ordinary? No. Seat belt? Yes.   Advanced directive? Yes  Health maintenance Shingrix - No Colonoscopy- Yes Tetanus- Yes HIV- Yes Hep C- Yes   Past Medical History:  Diagnosis Date   Allergy    Arthritis    Asthma    Followed by Pulmonology   CAD (coronary artery disease)    Chicken pox    Fistula    GERD (gastroesophageal reflux disease)    Hyperlipidemia    Hypertension    Thoracic aortic aneurysm [facility])       Past Surgical History:  Procedure Laterality Date   BOWEL RESECTION N/A 08/19/2021   Procedure: ROBOTIC LOW ANTERIOR BOWEL RESECTION;  Surgeon: Teresa Lonni HERO, MD;  Location: [facility] ORS;  Service: General;  Laterality: N/A;   COLONOSCOPY     CYSTOSCOPY     to check bladder   DENTAL SURGERY     ESOPHAGOGASTRODUODENOSCOPY     FLEXIBLE SIGMOIDOSCOPY N/A 08/19/2021   Procedure: FLEXIBLE SIGMOIDOSCOPY;  Surgeon: Teresa Lonni HERO, MD;  Location: [facility] ORS;  Service: General;  Laterality: N/A;   LEFT HEART CATH AND CORONARY ANGIOGRAPHY N/A 12/06/2018   Procedure: LEFT HEART CATH AND CORONARY ANGIOGRAPHY;  Surgeon: Verlin Lonni BIRCH, MD;  Location: [facility] INVASIVE CV LAB;  Service: Cardiovascular;  Laterality: N/A;   LEG SURGERY Left 2007   MASTECTOMY     WISDOM TOOTH EXTRACTION      Medications  Current Outpatient Medications on File Prior to Visit  Medication Sig Dispense Refill   albuterol  (VENTOLIN  HFA) 108 (90 Base) MCG/ACT inhaler Inhale 2 puffs into the lungs every 6 (six) hours as needed. 18 g 4   amLODipine  (NORVASC ) 5 MG tablet Take 1 tablet (5 mg total) by mouth daily. NEED OV. 30 tablet 0   aspirin  EC 81 MG tablet Take 1 tablet (81 mg total) by mouth daily. 90 tablet 3    benzonatate  (TESSALON ) 200 MG capsule Take 1 capsule (200 mg total) by mouth 3 (three) times daily as needed for cough. 90 capsule 1   cetirizine (ZYRTEC) 10 MG tablet Take 10 mg by mouth daily.     fluticasone  (FLOVENT  HFA) 110 MCG/ACT inhaler Inhale 2 puffs into the lungs in the morning and at bedtime. 12 g 6   metoprolol  succinate (TOPROL -XL) 25 MG 24 hr tablet Take 1 tablet (25 mg total) by mouth daily. 90 tablet 3   Omeprazole  20 MG TBEC Take 20 mg by mouth every other day.     rosuvastatin  (CRESTOR ) 40 MG tablet Take 1 tablet (40 mg total) by mouth daily. 30 tablet 0   sildenafil  (VIAGRA ) 100 MG tablet Take 1 tablet (100 mg total) by mouth as needed for erectile dysfunction. 10 tablet 1   No current facility-administered medications on file prior to visit.     Allergies Allergies  Allergen Reactions   Bactrim  [Sulfamethoxazole -Trimethoprim ] Rash    Joint pain   Penicillins Rash    DID THE REACTION INVOLVE: Swelling of the face/tongue/throat, SOB, or low BP? No Sudden or severe rash/hives, skin peeling, or the inside of the mouth or nose? No Did it require medical treatment? No When did it last happen? Childhood allergy If all  above answers are "NO", may proceed with cephalosporin use.     Family History Family History  Problem Relation Age of Onset   Hypertension Mother        Controlled with diet   Aortic aneurysm Mother    Hiatal hernia Father    Emphysema Maternal Uncle    Esophageal cancer Paternal Grandmother    Hodgkin's lymphoma Child        Remission   Stomach cancer Other    Rectal cancer Other    Colon cancer Other    Colon polyps Neg Hx     Review of Systems: Constitutional:  no fevers Eye:  no recent significant change in vision Ear/Nose/Mouth/Throat:  Ears:  no hearing loss Nose/Mouth/Throat:  no complaints of nasal congestion, no sore throat Cardiovascular:  no chest pain Respiratory:  no shortness of breath Gastrointestinal:  no change in bowel  habits GU:  Male: negative for dysuria, frequency Musculoskeletal/Extremities:  no joint pain Integumentary (Skin/Breast):  no abnormal skin lesions reported Neurologic:  no headaches Endocrine: No unexpected weight changes Hematologic/Lymphatic:  no abnormal bleeding  Exam BP 130/82 (BP Location: Left Arm, Patient Position: Sitting)   Pulse 70   Temp 98 F (36.7 C) (Oral)   Resp 16   Ht 6' 1 (1.854 m)   Wt 231 lb (104.8 kg)   SpO2 98%   BMI 30.48 kg/m  General:  well developed, well nourished, in no apparent distress Skin:  no significant moles, warts, or growths Head:  no masses, lesions, or tenderness Eyes:  pupils equal and round, sclera anicteric without injection Ears:  canals without lesions, TMs shiny without retraction, no obvious effusion, no erythema Nose:  nares patent, mucosa normal Throat/Pharynx:  lips and gingiva without lesion; tongue and uvula midline; non-inflamed pharynx; no exudates or postnasal drainage Neck: neck supple without adenopathy, thyromegaly, or masses Cardiac: RRR, no bruits, no LE edema Lungs:  clear to auscultation, breath sounds equal bilaterally, no respiratory distress Abdomen: BS+, soft, non-tender, non-distended, no masses or organomegaly noted Rectal: Deferred Musculoskeletal:  symmetrical muscle groups noted without atrophy or deformity Neuro:  gait normal; deep tendon reflexes normal and symmetric Psych: well oriented with normal range of affect and appropriate judgment/insight  Assessment and Plan  Well adult exam - Plan: CBC, Comprehensive metabolic panel with GFR, Lipid panel  Screening for prostate cancer - Plan: PSA  Prediabetes - Plan: Hemoglobin A1c   Well 63 y.o. male. Counseled on diet and exercise. Counseled on risks and benefits of prostate cancer screening with PSA. The patient agrees to undergo testing. Shingrix  #1 today. #2 in 2 mo.  PCV20 today.  Advanced directive form provided today.  Immunizations, labs,  and further orders as above. Follow up in 1 yr. The patient voiced understanding and agreement to the plan.  Mabel [place], DO 06/13/24 9:30 AM

## 2024-06-18 ENCOUNTER — Other Ambulatory Visit: Payer: Self-pay | Admitting: Cardiovascular Disease

## 2024-06-18 ENCOUNTER — Other Ambulatory Visit (HOSPITAL_BASED_OUTPATIENT_CLINIC_OR_DEPARTMENT_OTHER): Payer: Self-pay

## 2024-06-18 ENCOUNTER — Other Ambulatory Visit: Payer: Self-pay | Admitting: Physician Assistant

## 2024-06-18 MED ORDER — AMLODIPINE BESYLATE 5 MG PO TABS
5.0000 mg | ORAL_TABLET | Freq: Every day | ORAL | 0 refills | Status: DC
  Filled 2024-06-18 – 2024-07-09 (×2): qty 15, 15d supply, fill #0

## 2024-06-18 MED ORDER — ROSUVASTATIN CALCIUM 40 MG PO TABS
40.0000 mg | ORAL_TABLET | Freq: Every day | ORAL | 0 refills | Status: DC
  Filled 2024-06-18 – 2024-07-10 (×2): qty 15, 15d supply, fill #0

## 2024-06-19 ENCOUNTER — Ambulatory Visit (HOSPITAL_COMMUNITY)
Admission: RE | Admit: 2024-06-19 | Discharge: 2024-06-19 | Discharge status: Home / Self Care | Source: Ambulatory Visit | Attending: Surgery | Admitting: Surgery

## 2024-06-19 DIAGNOSIS — K760 Fatty (change of) liver, not elsewhere classified: Secondary | ICD-10-CM | POA: Insufficient documentation

## 2024-06-19 DIAGNOSIS — R918 Other nonspecific abnormal finding of lung field: Secondary | ICD-10-CM | POA: Diagnosis not present

## 2024-06-19 DIAGNOSIS — J984 Other disorders of lung: Secondary | ICD-10-CM | POA: Diagnosis not present

## 2024-06-19 DIAGNOSIS — I7121 Aneurysm of the ascending aorta, without rupture: Secondary | ICD-10-CM | POA: Insufficient documentation

## 2024-06-19 MED ORDER — IOHEXOL 350 MG/ML SOLN
75.0000 mL | Freq: Once | INTRAVENOUS | Status: AC | PRN
  Administered 2024-06-19: 75 mL via INTRAVENOUS

## 2024-06-27 ENCOUNTER — Ambulatory Visit: Attending: Surgery | Admitting: Surgery

## 2024-06-27 ENCOUNTER — Encounter: Payer: Self-pay | Admitting: Surgery

## 2024-06-27 VITALS — BP 128/83 | HR 76 | Resp 20 | Ht 72.0 in | Wt 233.0 lb

## 2024-06-27 DIAGNOSIS — I7121 Aneurysm of the ascending aorta, without rupture: Secondary | ICD-10-CM

## 2024-06-30 NOTE — Progress Notes (Signed)
 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   His most recent echocardiogram on 06/08/2023 showed left ventricular ejection fraction of 60 to 65%.  There was no aortic insufficiency.  The aortic root was measured at 46 mm on that study.  The ascending aorta was measured at 41 mm.  Cardiac catheterization on 12/06/2018 showed 50% mid LAD stenosis and a relatively small vessel that did not reach the apex and has been treated medically. He is now retired and stays active playing golf, fishing, and taking care of his house. His wife Romero is a nurse in our cardiac ICU. He denies any chest pain or shortness of breath with normal activity. He has had some chronic cough and sputum production that is felt to be due to bronchiectasis with recent CT scan of the chest showing persistent nodular changes in the lung bases bilaterally.  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  He has been followed by pulmonary medicine.   Current Outpatient Medications  Medication Sig Dispense Refill   albuterol  (VENTOLIN  HFA) 108 (90 Base) MCG/ACT inhaler Inhale 2 puffs into the lungs every 6 (six) hours as needed. 18 g 4   amLODipine  (NORVASC ) 5 MG tablet Take 1 tablet (5 mg total) by mouth daily. NEED OV. 15 tablet 0   aspirin  EC 81 MG tablet Take 1 tablet (81 mg total) by mouth daily. 90 tablet 3   benzonatate  (TESSALON ) 200 MG capsule Take 1 capsule (200 mg total) by mouth 3 (three) times daily as needed for cough. 90 capsule 1   cetirizine (ZYRTEC) 10 MG tablet Take 10 mg by mouth daily.     fluticasone  (FLOVENT  HFA) 110 MCG/ACT inhaler Inhale 2 puffs into the lungs in the morning and at bedtime. 12 g 6   metoprolol  succinate (TOPROL -XL) 25 MG 24 hr tablet Take 1 tablet (25 mg total) by mouth daily. 90 tablet 3   Omeprazole  20 MG TBEC Take 20 mg by mouth every other day.      rosuvastatin  (CRESTOR ) 40 MG tablet Take 1 tablet (40 mg total) by mouth daily. 15 tablet 0   sildenafil  (VIAGRA ) 100 MG tablet Take 1 tablet (100 mg total) by mouth as needed for erectile dysfunction. 10 tablet 1   No current facility-administered medications for this visit.     Physical Exam: BP 128/83   Pulse 76   Resp 20   Ht 6' (1.829 m)   Wt 233 lb (105.7 kg)   SpO2 95% Comment: RA  BMI 31.60 kg/m  He looks well. Cardiac exam shows a regular rate and rhythm with normal heart sounds.  There is no murmur. Lungs are clear.  Diagnostic Tests:  Narrative & Impression  CLINICAL DATA:  Ascending aortic aneurysm follow-up.   EXAM: CT ANGIOGRAPHY CHEST WITH CONTRAST   TECHNIQUE: Multidetector CT imaging of the chest was performed using the standard protocol during bolus administration of intravenous contrast. Multiplanar CT image reconstructions and MIPs were obtained to evaluate the vascular anatomy.   RADIATION DOSE REDUCTION: This exam was performed according to the departmental dose-optimization program which includes automated exposure control, adjustment of the mA and/or kV according to patient size and/or use of iterative reconstruction technique.   CONTRAST:  75mL OMNIPAQUE  IOHEXOL   350 MG/ML SOLN   COMPARISON:  Chest CT dated 08/12/2023.   FINDINGS: Cardiovascular: There is no cardiomegaly or pericardial effusion. There is coronary vascular calcification of the LAD. There is dilatation of the aortic root measures 4.9 cm in diameter. No aortic dissection. The origins of the great vessels of the aortic arch are patent. No pulmonary artery embolus identified.   Mediastinum/Nodes: No hilar or mediastinal adenopathy. Partially calcified left hilar granuloma. The esophagus is grossly unremarkable. No mediastinal fluid collection.   Lungs/Pleura: There is bilateral lower lobe predominant bronchial wall thickening. Faint reticulonodular densities at the left  lung base suspicious for atypical pneumonia or aspiration. No focal consolidation, pleural effusion, pneumothorax. The central airways remain patent.   Upper Abdomen: Fatty liver.   Musculoskeletal: No acute osseous pathology.   Review of the MIP images confirms the above findings.   IMPRESSION: 1. Dilatation of the aortic root measures 4.9 cm in diameter. No aortic dissection. 2. Faint reticulonodular densities at the left lung base suspicious for atypical pneumonia or aspiration. 3. Fatty liver.     Electronically Signed   By: Vanetta Chou M.D.   On: 06/20/2024 16:46      Impression:  I have personally reviewed his current CTA images and compared them with those on his previous scans and I think it is aortic root aneurysm is stable at 4.9 to 5 cm.  He has a normal trileaflet aortic valve on echocardiogram. His aneurysm is still well below the surgical threshold of 5.5 cm.  I reviewed the CT images with him and answered all of his questions.  I stressed the importance of continued good blood pressure control in preventing further enlargement and acute aortic dissection.  I advised him against doing any heavy lifting that may require a Valsalva maneuver and could suddenly raise his blood pressure to high levels.   Plan:  I will see him back in 1 year with a CTA of the chest for aortic surveillance.  I spent 10 minutes performing this established patient evaluation and > 50% of this time was spent face to face counseling and coordinating the care of this patient's aortic aneurysm.    Dorise MARLA Fellers, MD [facility] [phone]

## 2024-07-09 ENCOUNTER — Other Ambulatory Visit (HOSPITAL_BASED_OUTPATIENT_CLINIC_OR_DEPARTMENT_OTHER): Payer: Self-pay

## 2024-07-10 ENCOUNTER — Other Ambulatory Visit (HOSPITAL_BASED_OUTPATIENT_CLINIC_OR_DEPARTMENT_OTHER): Payer: Self-pay

## 2024-07-23 ENCOUNTER — Other Ambulatory Visit (HOSPITAL_BASED_OUTPATIENT_CLINIC_OR_DEPARTMENT_OTHER): Payer: Self-pay

## 2024-07-23 ENCOUNTER — Telehealth: Admitting: Family Medicine

## 2024-07-23 DIAGNOSIS — J208 Acute bronchitis due to other specified organisms: Secondary | ICD-10-CM | POA: Diagnosis not present

## 2024-07-23 DIAGNOSIS — B9689 Other specified bacterial agents as the cause of diseases classified elsewhere: Secondary | ICD-10-CM | POA: Diagnosis not present

## 2024-07-23 MED ORDER — DOXYCYCLINE HYCLATE 100 MG PO TABS
100.0000 mg | ORAL_TABLET | Freq: Two times a day (BID) | ORAL | 0 refills | Status: AC
  Filled 2024-07-23: qty 14, 7d supply, fill #0

## 2024-07-23 NOTE — Progress Notes (Signed)
 E-Visit for Cough   We are sorry that you are not feeling well.  Here is how we plan to help!  Based on your presentation I believe you most likely have A cough due to bacteria.  When patients have a fever and a productive cough with a change in color or increased sputum production, we are concerned about bacterial bronchitis.  If left untreated it can progress to pneumonia.  If your symptoms do not improve with your treatment plan it is important that you contact your provider.   I have prescribed Doxycycline  100 mg twice a day for 7 days    You do not have a fever, but you can still have a bacterial infection, so we will treat it as such given the duration and type of mucus you are not having.   In addition you may use A non-prescription cough medication called Mucinex DM: take 2 tablets every 12 hours.   From your responses in the eVisit questionnaire you describe inflammation in the upper respiratory tract which is causing a significant cough.  This is commonly called Bronchitis and has four common causes:   Allergies Viral Infections Acid Reflux Bacterial Infection Allergies, viruses and acid reflux are treated by controlling symptoms or eliminating the cause. An example might be a cough caused by taking certain blood pressure medications. You stop the cough by changing the medication. Another example might be a cough caused by acid reflux. Controlling the reflux helps control the cough.  USE OF BRONCHODILATOR (RESCUE) INHALERS: There is a risk from using your bronchodilator too frequently.  The risk is that over-reliance on a medication which only relaxes the muscles surrounding the breathing tubes can reduce the effectiveness of medications prescribed to reduce swelling and congestion of the tubes themselves.  Although you feel brief relief from the bronchodilator inhaler, your asthma may actually be worsening with the tubes becoming more swollen and filled with mucus.  This can delay  other crucial treatments, such as oral steroid medications. If you need to use a bronchodilator inhaler daily, several times per day, you should discuss this with your provider.  There are probably better treatments that could be used to keep your asthma under control.     HOME CARE Only take medications as instructed by your medical team. Complete the entire course of an antibiotic. Drink plenty of fluids and get plenty of rest. Avoid close contacts especially the very young and the elderly Cover your mouth if you cough or cough into your sleeve. Always remember to wash your hands A steam or ultrasonic humidifier can help congestion.   GET HELP RIGHT AWAY IF: You develop worsening fever. You become short of breath You cough up blood. Your symptoms persist after you have completed your treatment plan MAKE SURE YOU  Understand these instructions. Will watch your condition. Will get help right away if you are not doing well or get worse.    Thank you for choosing an e-visit.  Your e-visit answers were reviewed by a board certified advanced clinical practitioner to complete your personal care plan. Depending upon the condition, your plan could have included both over the counter or prescription medications.  Please review your pharmacy choice. Make sure the pharmacy is open so you can pick up prescription now. If there is a problem, you may contact your provider through [employer] and have the prescription routed to another pharmacy.  Your safety is important to us . If you have drug allergies check your prescription  carefully.   For the next 24 hours you can use MyChart to ask questions about today's visit, request a non-urgent call back, or ask for a work or school excuse. You will get an email in the next two days asking about your experience. I hope that your e-visit has been valuable and will speed your recovery.  I provided 5 minutes of non face-to-face time during this  encounter for chart review, medication and order placement, as well as and documentation.

## 2024-07-25 ENCOUNTER — Other Ambulatory Visit: Payer: Self-pay | Admitting: Cardiovascular Disease

## 2024-07-26 ENCOUNTER — Other Ambulatory Visit: Payer: Self-pay

## 2024-07-26 ENCOUNTER — Other Ambulatory Visit (HOSPITAL_BASED_OUTPATIENT_CLINIC_OR_DEPARTMENT_OTHER): Payer: Self-pay

## 2024-07-26 ENCOUNTER — Encounter (HOSPITAL_BASED_OUTPATIENT_CLINIC_OR_DEPARTMENT_OTHER): Payer: Self-pay

## 2024-07-26 MED ORDER — ROSUVASTATIN CALCIUM 40 MG PO TABS
40.0000 mg | ORAL_TABLET | Freq: Every day | ORAL | 0 refills | Status: DC
  Filled 2024-07-26: qty 15, 15d supply, fill #0

## 2024-07-26 MED ORDER — AMLODIPINE BESYLATE 5 MG PO TABS
5.0000 mg | ORAL_TABLET | Freq: Every day | ORAL | 0 refills | Status: DC
Start: 2024-07-26 — End: 2024-08-13
  Filled 2024-07-26: qty 15, 15d supply, fill #0

## 2024-08-09 ENCOUNTER — Other Ambulatory Visit: Payer: Self-pay | Admitting: Cardiovascular Disease

## 2024-08-09 ENCOUNTER — Other Ambulatory Visit (HOSPITAL_BASED_OUTPATIENT_CLINIC_OR_DEPARTMENT_OTHER): Payer: Self-pay

## 2024-08-13 ENCOUNTER — Other Ambulatory Visit: Payer: Self-pay | Admitting: Cardiovascular Disease

## 2024-08-14 ENCOUNTER — Other Ambulatory Visit: Payer: Self-pay

## 2024-08-14 ENCOUNTER — Other Ambulatory Visit (HOSPITAL_BASED_OUTPATIENT_CLINIC_OR_DEPARTMENT_OTHER): Payer: Self-pay

## 2024-08-14 MED ORDER — AMLODIPINE BESYLATE 5 MG PO TABS
5.0000 mg | ORAL_TABLET | Freq: Every day | ORAL | 0 refills | Status: DC
  Filled 2024-08-14: qty 30, 30d supply, fill #0

## 2024-08-14 MED ORDER — ROSUVASTATIN CALCIUM 40 MG PO TABS
40.0000 mg | ORAL_TABLET | Freq: Every day | ORAL | 0 refills | Status: DC
  Filled 2024-08-14: qty 30, 30d supply, fill #0

## 2024-08-15 ENCOUNTER — Ambulatory Visit (INDEPENDENT_AMBULATORY_CARE_PROVIDER_SITE_OTHER): Admitting: *Deleted

## 2024-08-15 DIAGNOSIS — Z23 Encounter for immunization: Secondary | ICD-10-CM

## 2024-08-15 NOTE — Progress Notes (Signed)
 Pt in for 2nd Shingrix  injection per Dr. Frann.  Given LD without complication.

## 2024-08-29 NOTE — Progress Notes (Unsigned)
 Cardiology Office Note    Date:  08/30/2024  ID:  Phillip [name], DOB [birth year], MRN [id number] PCP:  Frann Mabel Mt, DO  Cardiologist:  Lonni Cash, MD  Electrophysiologist:  None   Chief Complaint: f/u CAD  History of Present Illness: .    Phillip [name] is a 63 y.o. retired male with visit-pertinent history of ascending thoracic aortic aneurysm followed by Dr. Lucas, moderate CAD, GERD, HLD, HTN, IRBBB, asthma/bronchiectasis followed by pulmonology, ED, GERD, prior colon surgery for diverticulosis who is seen for follow-up. Wife Phillip [name] is a Engineer, civil (consulting) in CVICU.   Gated cardiac CT 2019 showed possible flow limiting lesion in the mid LAD, prompting cardiac cath 11/2018 with moderate disease in the small mid LAD and mild disease in the RCA and Circumflex, managed medically. Echo at that time showed EF 50-55%, dilated aorta which has been followed by Dr. Lucas with last CTA 05/2024 demonstrating 4.9cm dilation of aortic root, faint densities at left lung base (? Atypical PNA or aspiration), fatty liver. Dr. Lucas noted that the patient was being followed by pulmonology with a history of bronchiectasis. At last cardiology OV in 2024 he had reported some intermittent mild LE edema. Echo was updated 05/2023 showing EF 60-65%, normal diastolic function, aortic root aneurysm.  He is doing great without any recent angina or dyspnea. He continues with chronic dry cough followed by pulmonology. About 2 months ago he required an e-visit for bronchitis with rx for doxycycline  and states ever since then he's felt better. 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 He also remains active playing golf and fishing.   Labwork independently reviewed: 05/2024 LDL 81, trig 92, A1c 6.0, K 4.2, Cr 0.89, CO2 33, LFTs ok, CBC OK Hgb 16, plt 251 2017 TSH wnl  ROS: .    Please see the history of present illness.  All other systems are reviewed and otherwise  negative.  Studies Reviewed: SABRA    EKG:  EKG is ordered today, personally reviewed, demonstrating:  EKG Interpretation Date/Time:  Thursday August 30 2024 08:49:48 EDT Ventricular Rate:  87 PR Interval:  152 QRS Duration:  90 QT Interval:  382 QTC Calculation: 459 R Axis:   -29  Text Interpretation: Normal sinus rhythm Incomplete right bundle branch block Nonspecific T wave abnormality When compared with ECG of 17-May-2023 10:02, No significant change was found Confirmed by Phillip [name] [phone]) on 08/30/2024 9:01:30 AM    CV Studies: Cardiac studies reviewed are outlined and summarized above. Otherwise please see EMR for full report.   Current Reported Medications:.    Current Meds  Medication Sig   albuterol  (VENTOLIN  HFA) 108 (90 Base) MCG/ACT inhaler Inhale 2 puffs into the lungs every 6 (six) hours as needed.   amLODipine  (NORVASC ) 5 MG tablet Take 1 tablet (5 mg total) by mouth daily.   aspirin  EC 81 MG tablet Take 1 tablet (81 mg total) by mouth daily.   benzonatate  (TESSALON ) 200 MG capsule Take 1 capsule (200 mg total) by mouth 3 (three) times daily as needed for cough.   cetirizine (ZYRTEC) 10 MG tablet Take 10 mg by mouth daily.   fluticasone  (FLOVENT  HFA) 110 MCG/ACT inhaler Inhale 2 puffs into the lungs in the morning and at bedtime.   metoprolol  succinate (TOPROL -XL) 25 MG 24 hr tablet Take 1 tablet (25 mg total) by mouth daily.   Omeprazole  20 MG TBEC Take 20 mg by mouth every other day.  rosuvastatin  (CRESTOR ) 40 MG tablet Take 1 tablet (40 mg total) by mouth daily.   sildenafil  (VIAGRA ) 100 MG tablet Take 1 tablet (100 mg total) by mouth as needed for erectile dysfunction.    Physical Exam:    VS:  BP 130/80 (BP Location: Right Arm, Patient Position: Sitting, Cuff Size: Large)   Pulse 87   Ht 6' (1.829 m)   Wt 230 lb (104.3 kg)   SpO2 95%   BMI 31.19 kg/m    Wt Readings from Last 3 Encounters:  08/30/24 230 lb (104.3 kg)  06/27/24 233 lb (105.7 kg)   06/13/24 231 lb (104.8 kg)    GEN: Well nourished, well developed in no acute distress NECK: No JVD; No carotid bruits CARDIAC: RRR, no murmurs, rubs, gallops RESPIRATORY:  Clear to auscultation without rales, wheezing or rhonchi  ABDOMEN: Soft, non-tender, non-distended EXTREMITIES:  No edema; No acute deformity   Asessement and Plan:.    1. CAD, HLD - doing well without recent angina or dyspnea. Continue ASA 81mg  daily. Refill Toprol  25mg  daily, amlodipine  5mg  daily, rosuvastatin  40mg  today. Discussed that LDL was slightly above goal in 05/2024. Discussed options including medication titration versus lifestyle modification. He thinks labs were checked after falling off the wagon during a beach trip. He would like to recheck labs in 3 months to guide next steps. Lab orders placed for return CMET/lipid profile in 3 months. If LDL not at goal would offer Zetia vs referral to lipid clinic for PCSK9i.  2. Dilation of aorta - followed by Dr. Lucas with ongoing surveillance. Aneurysm precautions relayed at last OV. Reinforced today in the context of outdoor shrubbery work.  3. Essential HTN - SBP top normal. He reports it is always normal at home, citing value of 124-125 systolic at home. We discussed that if he begins to run upper 120s or into the 130s at home, he should reach out for further assistance as we may need to adjust his medication. There are many options to consider, including changing Toprol  to carvedilol or adding ARB. Would probably hold off increasing amlodipine  given tendency for LE edema in the past.  4. History of LE edema - resolved, currently not an active issue.     Disposition: F/u with Dr. Verlin or myself in 1 year.  Signed, Margean Korell N Zahria Ding, PA-C

## 2024-08-30 ENCOUNTER — Other Ambulatory Visit (HOSPITAL_BASED_OUTPATIENT_CLINIC_OR_DEPARTMENT_OTHER): Payer: Self-pay

## 2024-08-30 ENCOUNTER — Ambulatory Visit: Attending: Physician Assistant | Admitting: Physician Assistant

## 2024-08-30 ENCOUNTER — Encounter: Payer: Self-pay | Admitting: Physician Assistant

## 2024-08-30 VITALS — BP 130/80 | HR 87 | Ht 72.0 in | Wt 230.0 lb

## 2024-08-30 DIAGNOSIS — I10 Essential (primary) hypertension: Secondary | ICD-10-CM

## 2024-08-30 DIAGNOSIS — I712 Thoracic aortic aneurysm, without rupture, unspecified: Secondary | ICD-10-CM | POA: Diagnosis not present

## 2024-08-30 DIAGNOSIS — Z87898 Personal history of other specified conditions: Secondary | ICD-10-CM

## 2024-08-30 DIAGNOSIS — E785 Hyperlipidemia, unspecified: Secondary | ICD-10-CM | POA: Diagnosis not present

## 2024-08-30 DIAGNOSIS — I2510 Atherosclerotic heart disease of native coronary artery without angina pectoris: Secondary | ICD-10-CM

## 2024-08-30 DIAGNOSIS — N529 Male erectile dysfunction, unspecified: Secondary | ICD-10-CM

## 2024-08-30 MED ORDER — METOPROLOL SUCCINATE ER 25 MG PO TB24
25.0000 mg | ORAL_TABLET | Freq: Every day | ORAL | 3 refills | Status: AC
  Filled 2024-08-30 – 2024-10-17 (×3): qty 90, 90d supply, fill #0
  Filled 2024-11-27: qty 90, 90d supply, fill #1

## 2024-08-30 MED ORDER — AMLODIPINE BESYLATE 5 MG PO TABS
5.0000 mg | ORAL_TABLET | Freq: Every day | ORAL | 3 refills | Status: AC
  Filled 2024-08-30 – 2024-09-10 (×3): qty 90, 90d supply, fill #0
  Filled 2024-11-27: qty 90, 90d supply, fill #1

## 2024-08-30 MED ORDER — ROSUVASTATIN CALCIUM 40 MG PO TABS
40.0000 mg | ORAL_TABLET | Freq: Every day | ORAL | 3 refills | Status: AC
  Filled 2024-08-30 – 2024-09-10 (×3): qty 90, 90d supply, fill #0
  Filled 2024-11-27: qty 90, 90d supply, fill #1

## 2024-08-30 NOTE — Patient Instructions (Addendum)
 Medication Instructions:  Your physician recommends that you continue on your current medications as directed. Please refer to the Current Medication list given to you today.  *If you need a refill on your cardiac medications before your next appointment, please call your pharmacy*  Lab Work: AROUND 11/30/24, GO TO A [facility] NEAR YOU, FASTING, FOR:  CMET & LIPID  If you have labs (blood work) drawn today and your tests are completely normal, you will receive your results only by: MyChart Message (if you have MyChart) OR A paper copy in the mail If you have any lab test that is abnormal or we need to change your treatment, we will call you to review the results.  Testing/Procedures: None ordered  Follow-Up: At [facility], you and your health needs are our priority.  As part of our continuing mission to provide you with exceptional heart care, our providers are all part of one team.  This team includes your primary Cardiologist (physician) and Advanced Practice Providers or APPs (Physician Assistants and Nurse Practitioners) who all work together to provide you with the care you need, when you need it.  Your next appointment:   12 month(s)  Provider:   Lonni Cash, MD or Dayna Dunn, PA-C          We recommend signing up for the patient portal called MyChart.  Sign up information is provided on this After Visit Summary.  MyChart is used to connect with patients for Virtual Visits (Telemedicine).  Patients are able to view lab/test results, encounter notes, upcoming appointments, etc.  Non-urgent messages can be sent to your provider as well.   To learn more about what you can do with MyChart, go to [email or web address].   Other Instructions           Please monitor your blood pressure occasionally at home. If you tend to see readings in the upper 120s or into the 130s, please let me know and we can adjust your medication. Keep up the good work staying  physically active!

## 2024-09-03 ENCOUNTER — Other Ambulatory Visit: Payer: Self-pay

## 2024-09-03 ENCOUNTER — Other Ambulatory Visit (HOSPITAL_BASED_OUTPATIENT_CLINIC_OR_DEPARTMENT_OTHER): Payer: Self-pay

## 2024-09-03 ENCOUNTER — Other Ambulatory Visit: Payer: Self-pay | Admitting: Pulmonary Disease

## 2024-09-03 MED ORDER — BENZONATATE 200 MG PO CAPS
200.0000 mg | ORAL_CAPSULE | Freq: Three times a day (TID) | ORAL | 0 refills | Status: DC | PRN
  Filled 2024-09-03: qty 90, 30d supply, fill #0

## 2024-09-10 ENCOUNTER — Other Ambulatory Visit (HOSPITAL_BASED_OUTPATIENT_CLINIC_OR_DEPARTMENT_OTHER): Payer: Self-pay

## 2024-09-24 ENCOUNTER — Other Ambulatory Visit (HOSPITAL_BASED_OUTPATIENT_CLINIC_OR_DEPARTMENT_OTHER): Payer: Self-pay

## 2024-09-24 MED ORDER — FLUZONE 0.5 ML IM SUSY
0.5000 mL | PREFILLED_SYRINGE | Freq: Once | INTRAMUSCULAR | 0 refills | Status: AC
  Filled 2024-09-24: qty 0.5, 1d supply, fill #0

## 2024-10-17 ENCOUNTER — Other Ambulatory Visit: Payer: Self-pay | Admitting: Pulmonary Disease

## 2024-10-17 ENCOUNTER — Other Ambulatory Visit: Payer: Self-pay

## 2024-10-17 ENCOUNTER — Other Ambulatory Visit (HOSPITAL_BASED_OUTPATIENT_CLINIC_OR_DEPARTMENT_OTHER): Payer: Self-pay

## 2024-10-26 ENCOUNTER — Other Ambulatory Visit (HOSPITAL_BASED_OUTPATIENT_CLINIC_OR_DEPARTMENT_OTHER): Payer: Self-pay

## 2024-10-30 ENCOUNTER — Other Ambulatory Visit: Payer: Self-pay | Admitting: Pulmonary Disease

## 2024-11-02 ENCOUNTER — Other Ambulatory Visit: Payer: Self-pay | Admitting: Pulmonary Disease

## 2024-11-02 ENCOUNTER — Other Ambulatory Visit (HOSPITAL_BASED_OUTPATIENT_CLINIC_OR_DEPARTMENT_OTHER): Payer: Self-pay

## 2024-11-02 MED ORDER — FLUTICASONE PROPIONATE HFA 110 MCG/ACT IN AERO
2.0000 | INHALATION_SPRAY | Freq: Two times a day (BID) | RESPIRATORY_TRACT | 0 refills | Status: DC
  Filled 2024-11-02: qty 12, 30d supply, fill #0

## 2024-11-14 ENCOUNTER — Ambulatory Visit: Admitting: Primary Care

## 2024-11-14 ENCOUNTER — Other Ambulatory Visit: Payer: Self-pay | Admitting: Primary Care

## 2024-11-14 ENCOUNTER — Other Ambulatory Visit: Payer: Self-pay

## 2024-11-14 ENCOUNTER — Encounter: Payer: Self-pay | Admitting: Primary Care

## 2024-11-14 ENCOUNTER — Other Ambulatory Visit (HOSPITAL_BASED_OUTPATIENT_CLINIC_OR_DEPARTMENT_OTHER): Payer: Self-pay

## 2024-11-14 VITALS — BP 130/72 | HR 78 | Temp 97.7°F | Ht 72.0 in | Wt 241.0 lb

## 2024-11-14 DIAGNOSIS — J4530 Mild persistent asthma, uncomplicated: Secondary | ICD-10-CM | POA: Diagnosis not present

## 2024-11-14 DIAGNOSIS — J479 Bronchiectasis, uncomplicated: Secondary | ICD-10-CM

## 2024-11-14 DIAGNOSIS — R053 Chronic cough: Secondary | ICD-10-CM

## 2024-11-14 LAB — NITRIC OXIDE: Nitric Oxide: 34

## 2024-11-14 MED ORDER — BREZTRI AEROSPHERE 160-9-4.8 MCG/ACT IN AERO: 2.0000 | INHALATION_SPRAY | Freq: Two times a day (BID) | RESPIRATORY_TRACT | Status: DC

## 2024-11-14 MED ORDER — ALBUTEROL SULFATE HFA 108 (90 BASE) MCG/ACT IN AERS
2.0000 | INHALATION_SPRAY | Freq: Four times a day (QID) | RESPIRATORY_TRACT | 4 refills | Status: AC | PRN
  Filled 2024-11-14: qty 6.7, 25d supply, fill #0

## 2024-11-14 MED ORDER — FLUTICASONE PROPIONATE HFA 110 MCG/ACT IN AERO
2.0000 | INHALATION_SPRAY | Freq: Two times a day (BID) | RESPIRATORY_TRACT | 0 refills | Status: AC
  Filled 2024-11-14: qty 12, 30d supply, fill #0

## 2024-11-14 MED FILL — Sodium Chloride Soln Nebu 3%: RESPIRATORY_TRACT | 30 days supply | Qty: 750 | Fill #0 | Status: AC

## 2024-11-14 NOTE — Progress Notes (Signed)
 @Patient  ID: Phillip [name], male    DOB: September 12, 1961, 63 y.o.   MRN: [id number]  No chief complaint on file.   Referring provider: Frann Mabel Mt, DO  HPI: 63 year old male, never smoked. PMH significant for CAD, HTN, mild persistent asthma, allergic rhinitis, prediabetes.   Previous [facility] pulmonary encounter:  Patient with bronchiectasis, abnormal CT scan of the chest with a chronic cough History of asthmatic bronchitis  Symptoms have been stable  Still does have a chronic cough, not really bringing up a lot of secretions at present  No chest pains or chest discomfort No chronic fatigue, no weight loss, no night sweats   Recent CT scan reviewed showing nodular changes at the bases bilaterally, evidence of bronchiectasis  Assessment and Plan: Bronchiectasis with no significant exacerbation of present  Chronic cough -Call in prescription for Tessalon  Perles 200 to be used up to 3 times a day as needed for cough  He does have a follow-up CT scan in June/July-will follow-up on nodular changes at the bases  Bronchoscopy had been discussed previously as a means of further workup for bronchiectasis   11/14/2024- Interim hx  Discussed the use of AI scribe software for clinical note transcription with the patient, who gave verbal consent to proceed.  History of Present Illness Phillip [name] is a 63 year old male with asthma who presents with a chronic dry cough. He was referred by Dr. Valorie for evaluation of his chronic cough.  He has been experiencing a chronic dry cough for the past few years, which has not improved with the use of Flovent  or Tessalon . He takes Flovent  regularly, two puffs in the morning and two at night. The cough is persistent, occurring more frequently in colder, drier air, and is often triggered by talking, laughing, or exertion. He sometimes coughs hard enough to feel dizzy, but there is no consistent production of mucus.  A CTA  scan in July showed bronchial thickening and a faint reticular nodular density in the left lung. He has not experienced infectious symptoms such as fever or night sweats. The cough has not worsened significantly over time, but it has been more bothersome in recent colder weather.  He has a history of asthma, for which he has been on Flovent , Advair, and Serevent in the past. His asthma symptoms, such as wheezing and shortness of breath, are well-controlled with Flovent . No chest tightness or shortness of breath unless under exertion.  He underwent surgery three years ago for diverticulitis, during which he was on antibiotics for an extended period. He denies any known aspiration, and there are no pets at home. He experiences seasonal allergies, particularly in the spring, and takes an allergy pill regularly.  His eosinophil levels were elevated in 2022. He has not undergone lung function tests or a bronchoscopy in the past.  Allergies[1]  Immunization History  Administered Date(s) Administered   Influenza Split 09/05/2006, 10/23/2007   Influenza Whole 09/20/2012   Influenza, Seasonal, Injecte, Preservative Fre 09/02/2023, 09/24/2024   Influenza,inj,Quad PF,6+ Mos 07/30/2013, 09/01/2017, 09/08/2020   Influenza-Unspecified 08/29/2014, 09/08/2015, 09/18/2021   PFIZER(Purple Top)SARS-COV-2 Vaccination 11/19/2019, 12/12/2019, 08/29/2020   PNEUMOCOCCAL CONJUGATE-20 06/13/2024   Pfizer(Comirnaty )Fall Seasonal Vaccine 12 years and older 09/02/2023   Td 09/29/2006   Tdap 09/01/2017   Zoster Recombinant(Shingrix ) 06/13/2024, 08/15/2024    Past Medical History:  Diagnosis Date   Allergy    Arthritis    Asthma    Followed by Pulmonology   CAD (coronary  artery disease)    Chicken pox    Fistula    GERD (gastroesophageal reflux disease)    Hyperlipidemia    Hypertension    Thoracic aortic aneurysm     Tobacco History: Tobacco Use History[2] Counseling given: Not Answered   Outpatient  Medications Prior to Visit  Medication Sig Dispense Refill   albuterol  (VENTOLIN  HFA) 108 (90 Base) MCG/ACT inhaler Inhale 2 puffs into the lungs every 6 (six) hours as needed. 18 g 4   amLODipine  (NORVASC ) 5 MG tablet Take 1 tablet (5 mg total) by mouth daily. 90 tablet 3   aspirin  EC 81 MG tablet Take 1 tablet (81 mg total) by mouth daily. 90 tablet 3   benzonatate  (TESSALON ) 200 MG capsule Take 1 capsule (200 mg total) by mouth 3 (three) times daily as needed for cough. 90 capsule 0   cetirizine (ZYRTEC) 10 MG tablet Take 10 mg by mouth daily.     fluticasone  (FLOVENT  HFA) 110 MCG/ACT inhaler Inhale 2 puffs into the lungs in the morning and at bedtime. 12 g 0   metoprolol  succinate (TOPROL -XL) 25 MG 24 hr tablet Take 1 tablet (25 mg total) by mouth daily. 90 tablet 3   Omeprazole  20 MG TBEC Take 20 mg by mouth every other day.     rosuvastatin  (CRESTOR ) 40 MG tablet Take 1 tablet (40 mg total) by mouth daily. 90 tablet 3   sildenafil  (VIAGRA ) 100 MG tablet Take 1 tablet (100 mg total) by mouth as needed for erectile dysfunction. 10 tablet 1   No facility-administered medications prior to visit.   Review of Systems  Review of Systems  Constitutional: Negative.   HENT: Negative.    Respiratory:  Positive for cough. Negative for chest tightness, shortness of breath and wheezing.    Physical Exam  There were no vitals taken for this visit. Physical Exam Constitutional:      Appearance: Normal appearance. He is well-developed.  HENT:     Head: Normocephalic and atraumatic.     Mouth/Throat:     Mouth: Mucous membranes are moist.     Pharynx: Oropharynx is clear.  Cardiovascular:     Rate and Rhythm: Normal rate and regular rhythm.     Heart sounds: Normal heart sounds.  Pulmonary:     Effort: Pulmonary effort is normal. No respiratory distress.     Breath sounds: Normal breath sounds. No wheezing or rhonchi.  Musculoskeletal:        General: Normal range of motion.     Cervical  back: Normal range of motion and neck supple.  Skin:    General: Skin is warm and dry.     Findings: No erythema or rash.  Neurological:     General: No focal deficit present.     Mental Status: He is alert and oriented to person, place, and time. Mental status is at baseline.  Psychiatric:        Mood and Affect: Mood normal.        Behavior: Behavior normal.        Thought Content: Thought content normal.        Judgment: Judgment normal.      Lab Results:  CBC    Component Value Date/Time   WBC 5.2 06/13/2024 0912   RBC 5.24 06/13/2024 0912   HGB 16.0 06/13/2024 0912   HGB 15.1 12/01/2018 0735   HCT 47.6 06/13/2024 0912   HCT 43.7 12/01/2018 0735   PLT 251.0 06/13/2024 0912  PLT 266 12/01/2018 0735   MCV 90.8 06/13/2024 0912   MCV 88 12/01/2018 0735   MCH 30.4 08/21/2021 0402   MCHC 33.6 06/13/2024 0912   RDW 13.0 06/13/2024 0912   RDW 12.9 12/01/2018 0735   LYMPHSABS 1.9 07/31/2021 1454   MONOABS 0.7 07/31/2021 1454   EOSABS 0.4 07/31/2021 1454   BASOSABS 0.1 07/31/2021 1454    BMET    Component Value Date/Time   NA 138 06/13/2024 0912   NA 139 12/01/2018 0735   K 4.2 06/13/2024 0912   CL 100 06/13/2024 0912   CO2 33 (H) 06/13/2024 0912   GLUCOSE 104 (H) 06/13/2024 0912   BUN 11 06/13/2024 0912   BUN 11 12/01/2018 0735   CREATININE 0.89 06/13/2024 0912   CALCIUM  9.4 06/13/2024 0912   GFRNONAA >60 08/21/2021 0402   GFRAA 110 12/01/2018 0735    BNP No results found for: BNP  ProBNP No results found for: PROBNP  Imaging: No results found.   Assessment & Plan:   1. Mild persistent asthma, unspecified whether complicated (Primary) - Flutter valve; Future - Pulmonary Function Test; Future  2. Chronic cough - Nitric oxide  - Flutter valve; Future - Pulmonary Function Test; Future   Assessment and Plan Assessment & Plan Chronic cough Persisting for several years, exacerbated by cold, dry air, and activities such as talking, laughing,  or getting hot. No significant improvement with Flovent  or Tessalon . CT scan showed bronchial thickening and faint reticular nodular density in the left lung, suspicious for atypical pneumonia or aspiration, but no focal consolidation or pleural effusion. No fever, night sweats, or purulent mucus production. Differential includes cough variant asthma and possible atypical bacterial infection. Discussed potential use of gabapentin for chronic cough, noting it is not a narcotic or opioid and may cause grogginess or drowsiness. Bronchoscopy considered but not pursued due to potential harsh treatment with antibiotics if bacterial infection is confirmed. Risks of bronchoscopy include bleeding, pneumothorax, and infection. - Ordered sputum culture to check for abnormal organisms. - Performed FENO test to assess for inflammation or allergic component. - Consulted with Dr. Neda regarding further workup and potential bronchoscopy. - Will consider gabapentin for chronic cough if traditional methods fail. - Start hypertonic saline and flutter valve twice daily for secretion clearance  - Provided sputum cup for sample collection.  Mild persistent asthma Asthma diagnosis predates chronic cough. No current wheezing or shortness of breath at rest. Discussed potential change in inhaler based on FENO results. - FENO was elevated, 34, indicating type 2 airway inflammation  - Trial Breztri  two puffs twice daily - Will consider changing inhaler based on FENO results. - Ordered pulmonary function testing.  Lab Results  Component Value Date   NITRICOXIDE 34 11/14/2024   No results found for: FENOLVLPPB This result suggests intermediate (25-49) Type 2 (T2) airway inflammation; clinical correlation required.   Recording duration: 24 minutes  I personally spent a total of 40 minutes in the care of the patient today including performing a medically appropriate exam/evaluation, counseling and educating, placing  orders, documenting clinical information in the EHR, independently interpreting results, communicating results, and coordinating care.   Phillip LELON Ferrari, NP 11/14/2024     [1]  Allergies Allergen Reactions   Bactrim  [Sulfamethoxazole -Trimethoprim ] Rash    Joint pain   Penicillins Rash    DID THE REACTION INVOLVE: Swelling of the face/tongue/throat, SOB, or low BP? No Sudden or severe rash/hives, skin peeling, or the inside of the mouth or nose? No Did  it require medical treatment? No When did it last happen? Childhood allergy If all above answers are NO, may proceed with cephalosporin use.   [2]  Social History Tobacco Use  Smoking Status Never  Smokeless Tobacco Never

## 2024-11-14 NOTE — Patient Instructions (Addendum)
°  VISIT SUMMARY: Today, you were seen for a chronic dry cough that has been persistent for several years. We discussed your symptoms, including how the cough worsens in cold, dry air and with activities like talking or laughing. We reviewed your CT scan results and discussed potential causes and treatments for your cough. We also reviewed your asthma management and discussed potential changes based on upcoming test results.  YOUR PLAN: -CHRONIC COUGH: Your chronic cough has been ongoing for several years and is worse in cold, dry air and with activities like talking or laughing. A CT scan showed some changes in your lungs that could suggest an infection or other issues. We discussed the possibility of using gabapentin, a medication that can help with chronic cough, though it may cause drowsiness or grogginess. We also talked about the option of a bronchoscopy, a procedure to look inside your lungs, but decided against it for now due to its risks for the time being. We have ordered a sputum culture to check for any infections and performed a FENO test to look for inflammation or allergies. You were given a sputum cup to collect a sample at home.   -MILD PERSISTENT ASTHMA: Your asthma is well-controlled with your current medication, Flovent , and you have not had any recent worsening of symptoms. We will continue with your current treatment and may consider changing your inhaler based on the results of the FENO test. We have also ordered pulmonary function tests to further assess your asthma.  INSTRUCTIONS: Please collect a sputum sample at home using the provided cup and return it for testing. Continue taking Flovent  as prescribed. We will follow up with you after we receive the results of your sputum culture, FENO test, and pulmonary function tests.  Orders: FENO- was elevated 34 / indicating some airway inflammation possibly related to asthma Sputum culture for respiratory and AFB  Pulmonary function  testing  Rx: Hypertonic saline twice daily followed by flutter valve for secretion clearance  Trial Breztri  twp puffs morning and evening x 4 weeks (hold flovent  while using) Can try over the counter delsym for cough and continue tesslon perles if helpful   Follow-up: Schedule pulmonary function testing first available with FU Dr. Neda afterwards

## 2024-11-18 LAB — RESPIRATORY CULTURE OR RESPIRATORY AND SPUTUM CULTURE
MICRO NUMBER:: 17368035
SPECIMEN QUALITY:: ADEQUATE

## 2024-11-19 ENCOUNTER — Ambulatory Visit: Payer: Self-pay | Admitting: Primary Care

## 2024-11-20 ENCOUNTER — Other Ambulatory Visit (HOSPITAL_BASED_OUTPATIENT_CLINIC_OR_DEPARTMENT_OTHER): Payer: Self-pay

## 2024-11-20 ENCOUNTER — Telehealth: Payer: Self-pay | Admitting: *Deleted

## 2024-11-20 MED ORDER — CIPROFLOXACIN HCL 500 MG PO TABS
500.0000 mg | ORAL_TABLET | Freq: Two times a day (BID) | ORAL | 0 refills | Status: DC
  Filled 2024-11-20: qty 20, 10d supply, fill #0

## 2024-11-20 NOTE — Telephone Encounter (Signed)
 I will ask Dr. Lucas for clearance to use ciprofloxacin  treat pseudomonas

## 2024-11-20 NOTE — Telephone Encounter (Signed)
 Please advise

## 2024-11-20 NOTE — Telephone Encounter (Signed)
 Called and spoke to patient follow up has been made.order sent to pharmacy of choice.NFN

## 2024-11-20 NOTE — Telephone Encounter (Signed)
 Patient called the office stating that he had forgotten that his heart doctor, Dr. Zeb had told him in the past not to take Cipro  d/t some cardiac issues.  He was recently prescribed Cipro  by Almarie Ferrari NP to treat positive sputum cultures for pseudomonas.  I let him know I would send a message to Almarie to make her aware and once we hear back from her, we will call him back.  He verbalized understanding.  Beth, Patient called the office stating that he had forgotten that Dr. Zeb had told him previously not to take Cipro  d/t some cardiac issues he has.  Please advise.  Thank you.

## 2024-11-20 NOTE — Telephone Encounter (Signed)
 I sent a message to his cardiologist to ask if it were ok for him to take cipro . Typical treatment for pseudomonas is a flouroquinolone. Last EKG showed QTC interval 459. Possible QT prolongation, cipro  is the lowest risk for this.   Cc: Dr. Murlean (primary pulmonologist)

## 2024-11-27 ENCOUNTER — Ambulatory Visit

## 2024-11-27 ENCOUNTER — Other Ambulatory Visit (HOSPITAL_BASED_OUTPATIENT_CLINIC_OR_DEPARTMENT_OTHER): Payer: Self-pay

## 2024-11-27 ENCOUNTER — Other Ambulatory Visit: Payer: Self-pay

## 2024-11-27 ENCOUNTER — Other Ambulatory Visit: Payer: Self-pay | Admitting: Pulmonary Disease

## 2024-11-27 DIAGNOSIS — J4530 Mild persistent asthma, uncomplicated: Secondary | ICD-10-CM

## 2024-11-27 DIAGNOSIS — R053 Chronic cough: Secondary | ICD-10-CM

## 2024-11-27 LAB — PULMONARY FUNCTION TEST
DL/VA % pred: 106 %
DL/VA: 4.43 ml/min/mmHg/L
DLCO unc % pred: 92 %
DLCO unc: 27.18 ml/min/mmHg
FEF 25-75 Post: 2.57 L/s
FEF 25-75 Pre: 1.83 L/s
FEF2575-%Change-Post: 40 %
FEF2575-%Pred-Post: 82 %
FEF2575-%Pred-Pre: 58 %
FEV1-%Change-Post: 6 %
FEV1-%Pred-Post: 69 %
FEV1-%Pred-Pre: 65 %
FEV1-Post: 2.7 L
FEV1-Pre: 2.54 L
FEV1FVC-%Change-Post: 0 %
FEV1FVC-%Pred-Pre: 98 %
FEV6-%Change-Post: 5 %
FEV6-%Pred-Post: 72 %
FEV6-%Pred-Pre: 68 %
FEV6-Post: 3.55 L
FEV6-Pre: 3.37 L
FEV6FVC-%Change-Post: 0 %
FEV6FVC-%Pred-Post: 103 %
FEV6FVC-%Pred-Pre: 102 %
FVC-%Change-Post: 5 %
FVC-%Pred-Post: 70 %
FVC-%Pred-Pre: 66 %
FVC-Post: 3.61 L
FVC-Pre: 3.43 L
Post FEV1/FVC ratio: 75 %
Post FEV6/FVC ratio: 98 %
Pre FEV1/FVC ratio: 74 %
Pre FEV6/FVC Ratio: 98 %
RV % pred: 121 %
RV: 2.94 L
TLC % pred: 93 %
TLC: 7.03 L

## 2024-11-27 MED ORDER — BENZONATATE 200 MG PO CAPS
200.0000 mg | ORAL_CAPSULE | Freq: Three times a day (TID) | ORAL | 0 refills | Status: AC | PRN
  Filled 2024-11-27: qty 90, 30d supply, fill #0

## 2024-11-27 NOTE — Progress Notes (Signed)
 Full pft performed today

## 2024-11-27 NOTE — Patient Instructions (Signed)
 Full pft performed today

## 2024-12-03 ENCOUNTER — Other Ambulatory Visit (HOSPITAL_BASED_OUTPATIENT_CLINIC_OR_DEPARTMENT_OTHER): Payer: Self-pay

## 2024-12-12 ENCOUNTER — Ambulatory Visit: Admitting: Pulmonary Disease

## 2024-12-12 ENCOUNTER — Other Ambulatory Visit (HOSPITAL_BASED_OUTPATIENT_CLINIC_OR_DEPARTMENT_OTHER): Payer: Self-pay

## 2024-12-12 ENCOUNTER — Encounter: Payer: Self-pay | Admitting: Pulmonary Disease

## 2024-12-12 VITALS — BP 124/74 | HR 70 | Temp 97.6°F | Ht 72.0 in | Wt 239.0 lb

## 2024-12-12 DIAGNOSIS — R053 Chronic cough: Secondary | ICD-10-CM

## 2024-12-12 DIAGNOSIS — J4540 Moderate persistent asthma, uncomplicated: Secondary | ICD-10-CM | POA: Diagnosis not present

## 2024-12-12 DIAGNOSIS — J479 Bronchiectasis, uncomplicated: Secondary | ICD-10-CM

## 2024-12-12 DIAGNOSIS — J4530 Mild persistent asthma, uncomplicated: Secondary | ICD-10-CM

## 2024-12-12 MED ORDER — BUDESONIDE-FORMOTEROL FUMARATE 160-4.5 MCG/ACT IN AERO
2.0000 | INHALATION_SPRAY | Freq: Two times a day (BID) | RESPIRATORY_TRACT | 6 refills | Status: AC
  Filled 2024-12-12: qty 10.2, 30d supply, fill #0

## 2024-12-12 NOTE — Progress Notes (Signed)
 "              Phillip [name]    [id number]    11-16-1961  Primary Care Physician:Wendling, Mabel Mt, DO  Referring Physician: Frann Mabel Mt, DO [address] [place],  [place] [address]  Chief complaint:  Patient being seen for a cough Cough is improved since the last visit  Discussed the use of AI scribe software for clinical note transcription with the patient, who gave verbal consent to proceed.  History of Present Illness Phillip [name] is a 64 year old male with asthma who presents with persistent wheezing and breathing difficulties.  He experiences sporadic wheezing, particularly when lying down at night. The wheezing is intermittent, and he rarely has severe asthma attacks that require immediate use of albuterol .  He has a history of asthma since childhood, which re-emerged at age 58. He has been using rescue inhalers since then. Currently, he uses Flovent , two puffs in the morning and two at night. He has tried other inhalers like Advair and Symbicort , but insurance changes have affected his ability to continue some treatments. He has been on Symbicort  and liked it, but it depends on insurance coverage.  His activity level is good, walking three miles almost every day. He uses albuterol  as needed, typically once a week, and has a sample of Brextrin, although it is not for asthma.  A past CT scan showed spots on his lungs, which were less prominent in a follow-up scan.  He occasionally coughs up secretions, particularly in the morning, but does not produce a large amount of secretions regularly. No significant wheezing except sporadically, and no frequent asthma attacks.   Outpatient Encounter Medications as of 12/12/2024  Medication Sig   albuterol  (VENTOLIN  HFA) 108 (90 Base) MCG/ACT inhaler Inhale 2 puffs into the lungs every 6 (six) hours as needed.   amLODipine  (NORVASC ) 5 MG tablet Take 1 tablet (5 mg total) by mouth  daily.   aspirin  EC 81 MG tablet Take 1 tablet (81 mg total) by mouth daily.   benzonatate  (TESSALON ) 200 MG capsule Take 1 capsule (200 mg total) by mouth 3 (three) times daily as needed for cough.   budesonide -formoterol  (SYMBICORT ) 160-4.5 MCG/ACT inhaler Inhale 2 puffs into the lungs 2 (two) times daily.   cetirizine (ZYRTEC) 10 MG tablet Take 10 mg by mouth daily.   fluticasone  (FLOVENT  HFA) 110 MCG/ACT inhaler Inhale 2 puffs into the lungs in the morning and at bedtime.   metoprolol  succinate (TOPROL -XL) 25 MG 24 hr tablet Take 1 tablet (25 mg total) by mouth daily.   Omeprazole  20 MG TBEC Take 20 mg by mouth every other day.   rosuvastatin  (CRESTOR ) 40 MG tablet Take 1 tablet (40 mg total) by mouth daily.   sildenafil  (VIAGRA ) 100 MG tablet Take 1 tablet (100 mg total) by mouth as needed for erectile dysfunction.   sodium chloride  HYPERTONIC 3 % nebulizer solution Take by nebulization 2 (two) times daily as needed for cough.   [DISCONTINUED] budesonide -glycopyrrolate-formoterol  (BREZTRI  AEROSPHERE) 160-9-4.8 MCG/ACT AERO inhaler Inhale 2 puffs into the lungs in the morning and at bedtime. (Patient not taking: Reported on 12/12/2024)   [DISCONTINUED] ciprofloxacin  (CIPRO ) 500 MG tablet Take 1 tablet (500 mg total) by mouth 2 (two) times daily.   No facility-administered encounter medications on file as of 12/12/2024.    Allergies as of 12/12/2024 - Review Complete 12/12/2024  Allergen Reaction Noted   Bactrim  [sulfamethoxazole -trimethoprim ] Rash 02/06/2021  Penicillins Rash     Past Medical History:  Diagnosis Date   Allergy    Arthritis    Asthma    Followed by Pulmonology   CAD (coronary artery disease)    Chicken pox    Fistula    GERD (gastroesophageal reflux disease)    Hyperlipidemia    Hypertension    Thoracic aortic aneurysm     Past Surgical History:  Procedure Laterality Date   BOWEL RESECTION N/A 08/19/2021   Procedure: ROBOTIC LOW ANTERIOR BOWEL RESECTION;   Surgeon: Teresa Lonni HERO, MD;  Location: [facility];  Service: General;  Laterality: N/A;   COLONOSCOPY     CYSTOSCOPY     to check bladder   DENTAL SURGERY     ESOPHAGOGASTRODUODENOSCOPY     FLEXIBLE SIGMOIDOSCOPY N/A 08/19/2021   Procedure: FLEXIBLE SIGMOIDOSCOPY;  Surgeon: Teresa Lonni HERO, MD;  Location: [facility];  Service: General;  Laterality: N/A;   LEFT HEART CATH AND CORONARY ANGIOGRAPHY N/A 12/06/2018   Procedure: LEFT HEART CATH AND CORONARY ANGIOGRAPHY;  Surgeon: Verlin Lonni BIRCH, MD;  Location: [facility];  Service: Cardiovascular;  Laterality: N/A;   LEG SURGERY Left 2007   MASTECTOMY     WISDOM TOOTH EXTRACTION      Family History  Problem Relation Age of Onset   Hypertension Mother        Controlled with diet   Aortic aneurysm Mother    Hiatal hernia Father    Emphysema Maternal Uncle    Esophageal cancer Paternal Grandmother    Hodgkin's lymphoma Child        Remission   Stomach cancer Other    Rectal cancer Other    Colon cancer Other    Colon polyps Neg Hx     Social History   Socioeconomic History   Marital status: Married    Spouse name: Not on file   Number of children: 2   Years of education: Not on file   Highest education level: Bachelor's degree (e.g., BA, AB, BS)  Occupational History   Occupation: management health care  Tobacco Use   Smoking status: Never   Smokeless tobacco: Never  Vaping Use   Vaping status: Never Used  Substance and Sexual Activity   Alcohol use: Yes    Alcohol/week: 0.0 standard drinks of alcohol    Comment: socially   Drug use: No   Sexual activity: Not on file  Other Topics Concern   Not on file  Social History Narrative   Not on file   Social Drivers of Health   Tobacco Use: Low Risk (12/12/2024)   Patient History    Smoking Tobacco Use: Never    Smokeless Tobacco Use: Never    Passive Exposure: Not on file  Financial Resource Strain: Low Risk (06/12/2024)   Overall Financial Resource  Strain (CARDIA)    Difficulty of Paying Living Expenses: Not hard at all  Food Insecurity: No Food Insecurity (06/12/2024)   Epic    Worried About Programme Researcher, Broadcasting/film/video in the Last Year: Never true    Ran Out of Food in the Last Year: Never true  Transportation Needs: No Transportation Needs (06/12/2024)   Epic    Lack of Transportation (Medical): No    Lack of Transportation (Non-Medical): No  Physical Activity: Insufficiently Active (06/12/2024)   Exercise Vital Sign    Days of Exercise per Week: 2 days    Minutes of Exercise per Session: 40 min  Stress: No Stress  Concern Present (06/12/2024)   [employer] of Occupational Health - Occupational Stress Questionnaire    Feeling of Stress: Not at all  Social Connections: Socially Integrated (06/12/2024)   Social Connection and Isolation Panel    Frequency of Communication with Friends and Family: Three times a week    Frequency of Social Gatherings with Friends and Family: More than three times a week    Attends Religious Services: More than 4 times per year    Active Member of Clubs or Organizations: Yes    Attends Banker Meetings: More than 4 times per year    Marital Status: Married  Catering Manager Violence: Not on file  Depression (PHQ2-9): Low Risk (06/13/2024)   Depression (PHQ2-9)    PHQ-2 Score: 0  Alcohol Screen: Low Risk (06/12/2024)   Alcohol Screen    Last Alcohol Screening Score (AUDIT): 7  Housing: Unknown (06/12/2024)   Epic    Unable to Pay for Housing in the Last Year: No    Number of Times Moved in the Last Year: Not on file    Homeless in the Last Year: No  Utilities: Not on file  Health Literacy: Not on file    Review of Systems  Respiratory:  Positive for cough and shortness of breath.     Vitals:   12/12/24 1032  BP: 124/74  Pulse: 70  Temp: 97.6 F (36.4 C)  SpO2: 95%     Physical Exam Constitutional:      Appearance: He is obese.  HENT:     Head: Normocephalic.     Right  Ear: Tympanic membrane normal.     Mouth/Throat:     Mouth: Mucous membranes are moist.  Eyes:     General: No scleral icterus. Cardiovascular:     Rate and Rhythm: Normal rate and regular rhythm.     Heart sounds: No murmur heard.    No friction rub.  Pulmonary:     Effort: No respiratory distress.     Breath sounds: No stridor. No wheezing or rhonchi.  Musculoskeletal:     Cervical back: No rigidity or tenderness.  Skin:    General: Skin is warm.  Neurological:     General: No focal deficit present.     Mental Status: He is alert.  Psychiatric:        Mood and Affect: Mood normal.    Data Reviewed: CT chest 06/19/2024 reviewed-evidence of bronchiectasis  PFT 11/27/2024 shows no obstruction, no significant bronchodilator response, evidence of air trapping with elevated RV/TLC, normal diffusing capacity  Assessment and Plan Assessment & Plan Moderate persistent asthma Intermittent wheezing, particularly at night and during physical activity. Spirometry shows obstruction in large and small airways with improvement upon albuterol  use. Current treatment includes Flovent  and albuterol  as needed. Previous use of Advair and Symbicort  was effective but limited by insurance coverage. Symbicort  is preferred if covered, as it may provide better control than Flovent  alone. - Prescribed Symbicort  two puffs twice daily if covered by insurance. - Continue Flovent  two puffs twice daily if Symbicort  is not covered. - Ensure albuterol  is available for use as needed.  Bronchiectasis Chronic bronchiectasis with improvement noted on recent CT scan. Previous CT scans showed less prominent bronchiectasis. Treatment for mycobacterium is not indicated unless symptoms develop or CT scans worsen. Nebulized treatments may help if there are significant secretions, but are not necessary if secretions are minimal. - Continue annual CT scans to monitor bronchiectasis. - Consider nebulized saline or albuterol   if significant secretions are present.   Follow-up on CT  Encouraged to give us  a call with any significant concerns  Follow-up in about 6 months  No orders of the defined types were placed in this encounter.     Jennet Epley MD [facility] 12/12/2024, 8:26 PM  CC: Frann Mabel Mt, DO   "

## 2024-12-12 NOTE — Patient Instructions (Signed)
 Follow-up in 6 months  I have put in for prescription for Symbicort  to be used 2 puffs twice a day in place of the Flovent   You may use nebulized saline to help clear secretions, if you are not bringing up a lot of secretions then it may not seem like it is helping much  Continue graded activities as tolerated  Your breathing study as we reviewed today shows that you do drop a little bit of air in your lungs -The inhaler should help keep the lungs as open as possible  Call us  with significant concerns  We will review your CT scan once you have the repeat done

## 2025-06-14 ENCOUNTER — Encounter: Admitting: Family Medicine
